# Patient Record
Sex: Female | Born: 1965 | Race: Black or African American | Hispanic: No | Smoking: Current every day smoker
Health system: Southern US, Community
[De-identification: ages and names within clinical notes are randomized; demographics above are authoritative.]

## PROBLEM LIST (undated history)

## (undated) DIAGNOSIS — M109 Gout, unspecified: Secondary | ICD-10-CM

## (undated) DIAGNOSIS — E7800 Pure hypercholesterolemia, unspecified: Secondary | ICD-10-CM

## (undated) HISTORY — PX: BREAST SURGERY: SHX581

## (undated) HISTORY — PX: NEPHRECTOMY: SHX65

---

## 2002-09-07 ENCOUNTER — Encounter: Payer: Self-pay | Admitting: Emergency Medicine

## 2002-09-07 ENCOUNTER — Emergency Department (HOSPITAL_COMMUNITY): Admission: EM | Admit: 2002-09-07 | Discharge: 2002-09-07 | Payer: Self-pay | Admitting: Emergency Medicine

## 2002-11-28 ENCOUNTER — Emergency Department (HOSPITAL_COMMUNITY): Admission: EM | Admit: 2002-11-28 | Discharge: 2002-11-28 | Payer: Self-pay | Admitting: Emergency Medicine

## 2003-07-13 ENCOUNTER — Emergency Department (HOSPITAL_COMMUNITY): Admission: EM | Admit: 2003-07-13 | Discharge: 2003-07-13 | Payer: Self-pay | Admitting: *Deleted

## 2003-07-26 ENCOUNTER — Emergency Department (HOSPITAL_COMMUNITY): Admission: EM | Admit: 2003-07-26 | Discharge: 2003-07-26 | Payer: Self-pay | Admitting: Emergency Medicine

## 2004-05-31 ENCOUNTER — Emergency Department (HOSPITAL_COMMUNITY): Admission: EM | Admit: 2004-05-31 | Discharge: 2004-05-31 | Payer: Self-pay | Admitting: Emergency Medicine

## 2006-08-03 ENCOUNTER — Emergency Department (HOSPITAL_COMMUNITY): Admission: EM | Admit: 2006-08-03 | Discharge: 2006-08-03 | Payer: Self-pay | Admitting: Emergency Medicine

## 2007-03-05 ENCOUNTER — Emergency Department (HOSPITAL_COMMUNITY): Admission: EM | Admit: 2007-03-05 | Discharge: 2007-03-05 | Payer: Self-pay | Admitting: Emergency Medicine

## 2007-10-05 ENCOUNTER — Emergency Department (HOSPITAL_COMMUNITY): Admission: EM | Admit: 2007-10-05 | Discharge: 2007-10-05 | Payer: Self-pay | Admitting: Emergency Medicine

## 2007-10-07 ENCOUNTER — Emergency Department (HOSPITAL_COMMUNITY): Admission: EM | Admit: 2007-10-07 | Discharge: 2007-10-07 | Payer: Self-pay | Admitting: Emergency Medicine

## 2007-11-15 ENCOUNTER — Ambulatory Visit (HOSPITAL_COMMUNITY): Admission: RE | Admit: 2007-11-15 | Discharge: 2007-11-15 | Payer: Self-pay | Admitting: Family Medicine

## 2007-11-29 ENCOUNTER — Ambulatory Visit (HOSPITAL_COMMUNITY): Admission: RE | Admit: 2007-11-29 | Discharge: 2007-11-29 | Payer: Self-pay | Admitting: Family Medicine

## 2007-12-04 ENCOUNTER — Ambulatory Visit (HOSPITAL_COMMUNITY): Admission: RE | Admit: 2007-12-04 | Discharge: 2007-12-04 | Payer: Self-pay | Admitting: General Surgery

## 2007-12-04 ENCOUNTER — Encounter (INDEPENDENT_AMBULATORY_CARE_PROVIDER_SITE_OTHER): Payer: Self-pay | Admitting: General Surgery

## 2008-05-20 ENCOUNTER — Ambulatory Visit (HOSPITAL_COMMUNITY): Admission: RE | Admit: 2008-05-20 | Discharge: 2008-05-20 | Payer: Self-pay | Admitting: Family Medicine

## 2009-09-15 ENCOUNTER — Emergency Department (HOSPITAL_COMMUNITY): Admission: EM | Admit: 2009-09-15 | Discharge: 2009-09-15 | Payer: Self-pay | Admitting: Emergency Medicine

## 2009-11-13 ENCOUNTER — Emergency Department (HOSPITAL_COMMUNITY): Admission: EM | Admit: 2009-11-13 | Discharge: 2009-11-13 | Payer: Self-pay | Admitting: Emergency Medicine

## 2010-05-07 ENCOUNTER — Emergency Department (HOSPITAL_COMMUNITY): Admission: EM | Admit: 2010-05-07 | Discharge: 2010-05-07 | Payer: Self-pay | Admitting: Emergency Medicine

## 2010-06-19 ENCOUNTER — Ambulatory Visit (HOSPITAL_COMMUNITY): Admission: RE | Admit: 2010-06-19 | Discharge: 2010-06-19 | Payer: Self-pay | Admitting: Family Medicine

## 2010-09-16 ENCOUNTER — Ambulatory Visit (HOSPITAL_COMMUNITY): Admission: RE | Admit: 2010-09-16 | Discharge: 2010-09-16 | Payer: Self-pay | Admitting: Family Medicine

## 2010-11-03 IMAGING — CR DG ABDOMEN ACUTE W/ 1V CHEST
4 series · 4 of 4 positions shown · non-contrast
Comparison: Chest radiograph 09/15/2009

CLINICAL DATA: Abdominal pain, constipation, hypertension, smoker

ACUTE ABDOMEN SERIES (ABDOMEN 2 VIEW & CHEST 1 VIEW)

[view not recorded (1 of 4)]
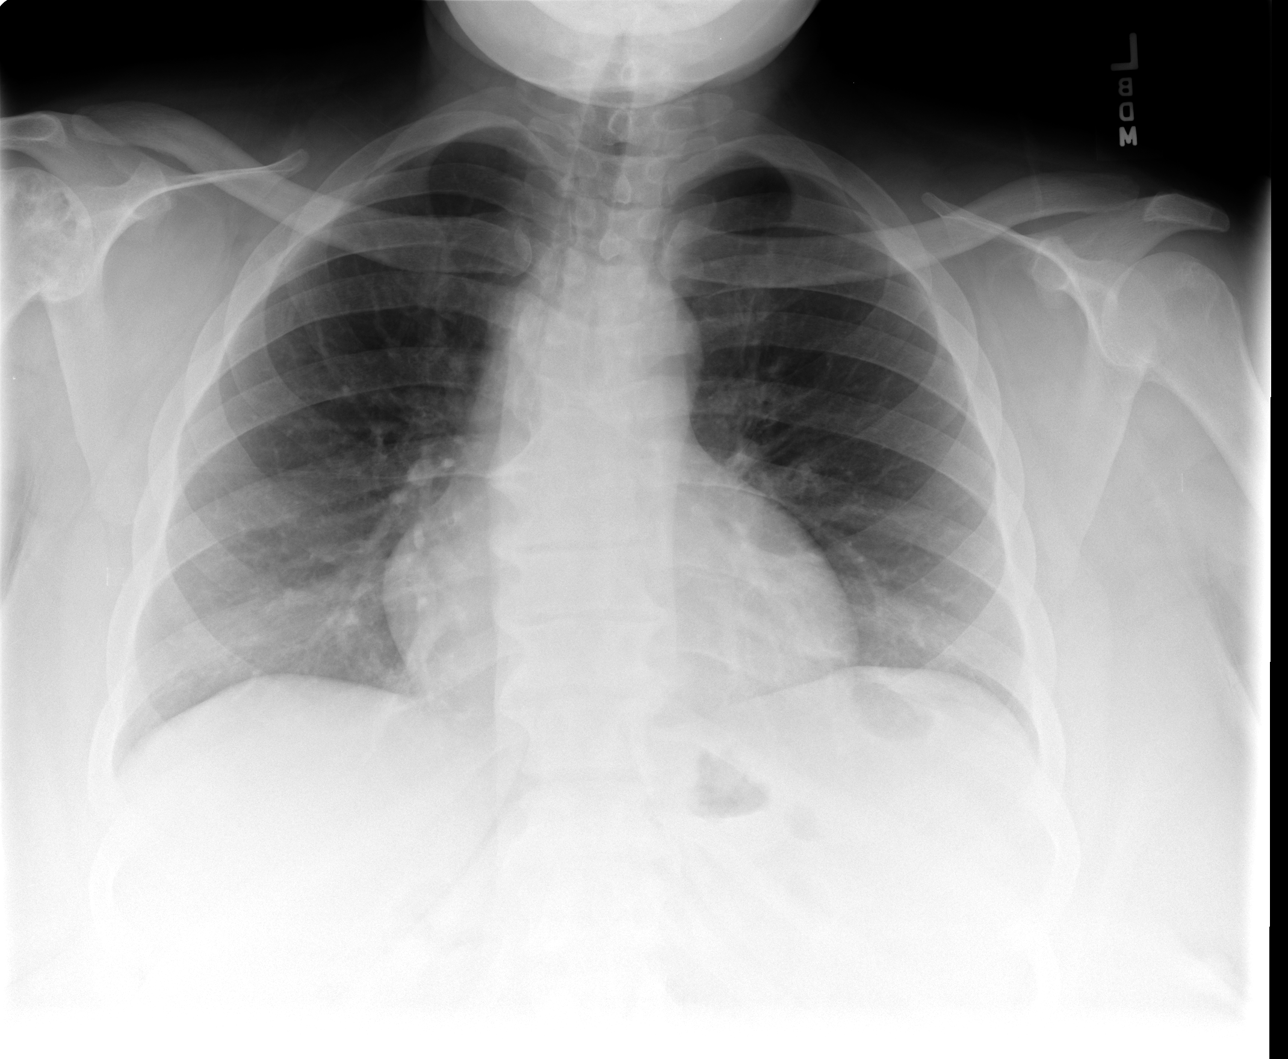

[view not recorded (2 of 4)]
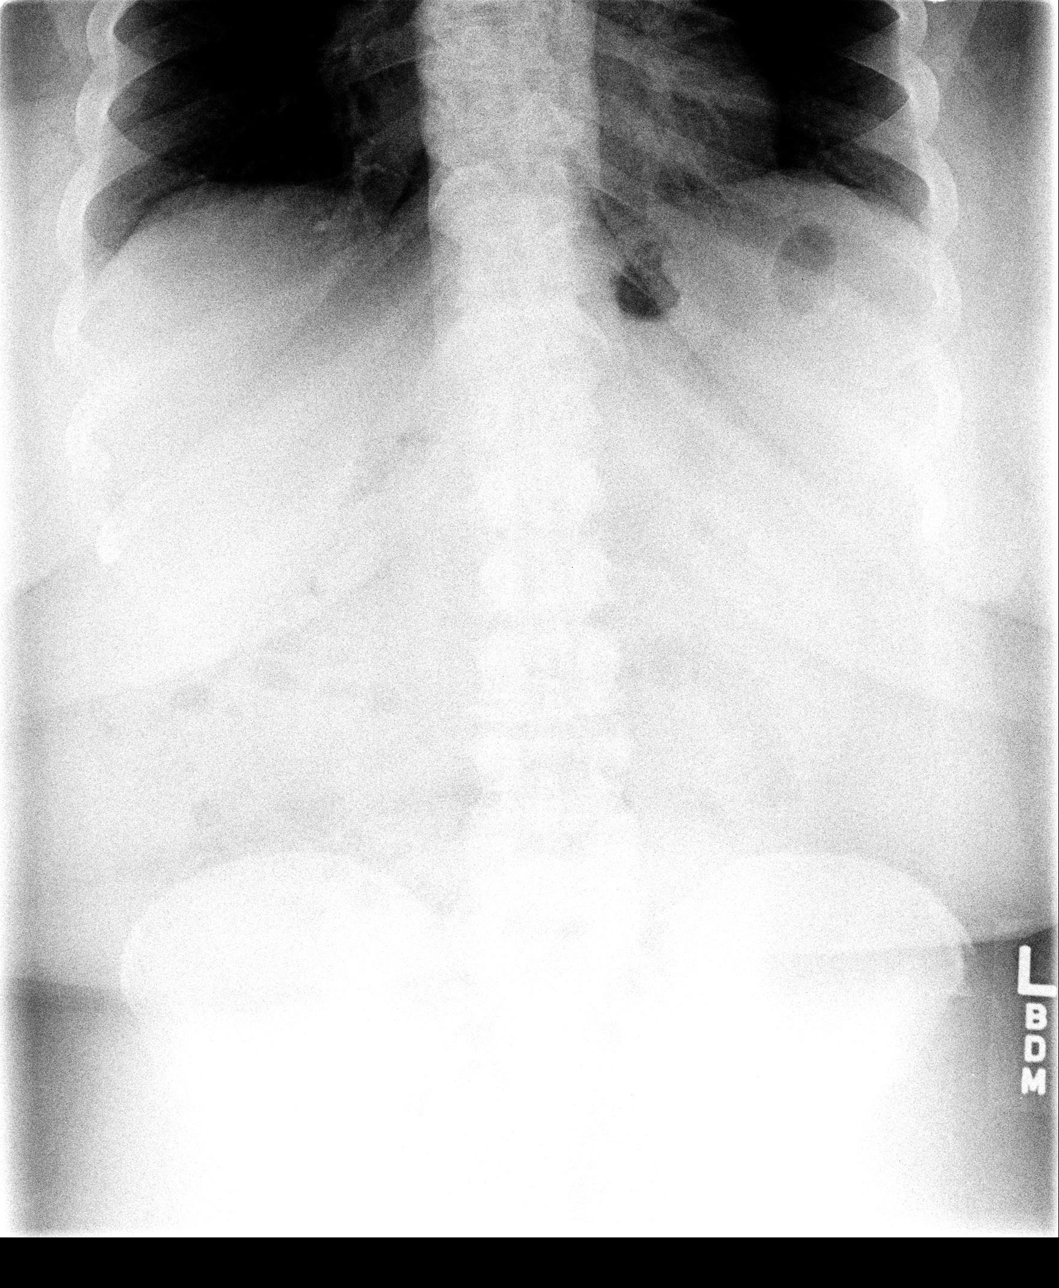

[view not recorded (3 of 4)]
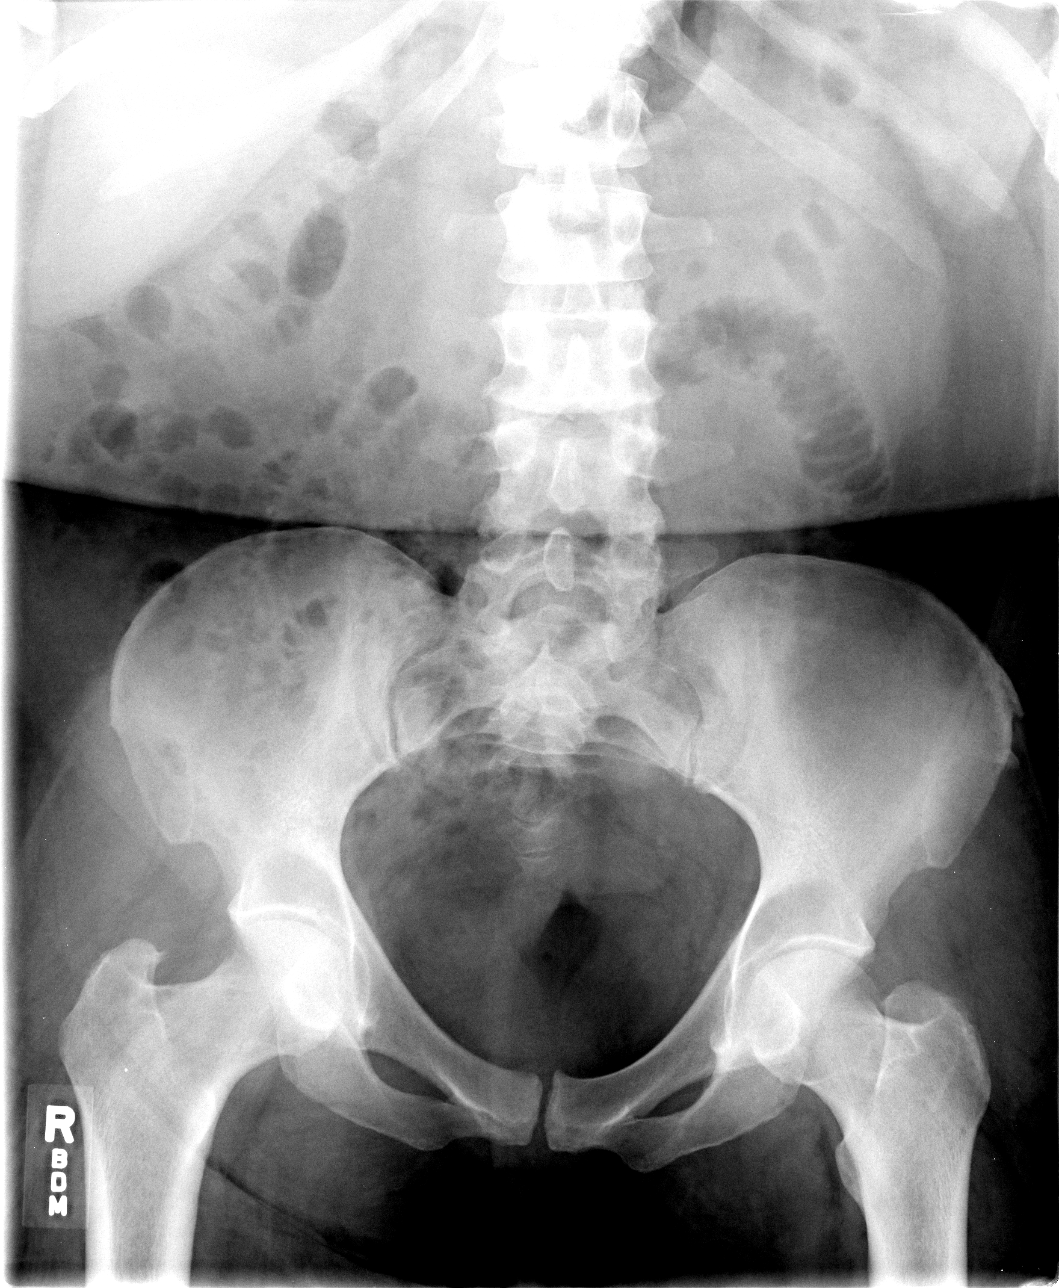

[view not recorded (4 of 4)]
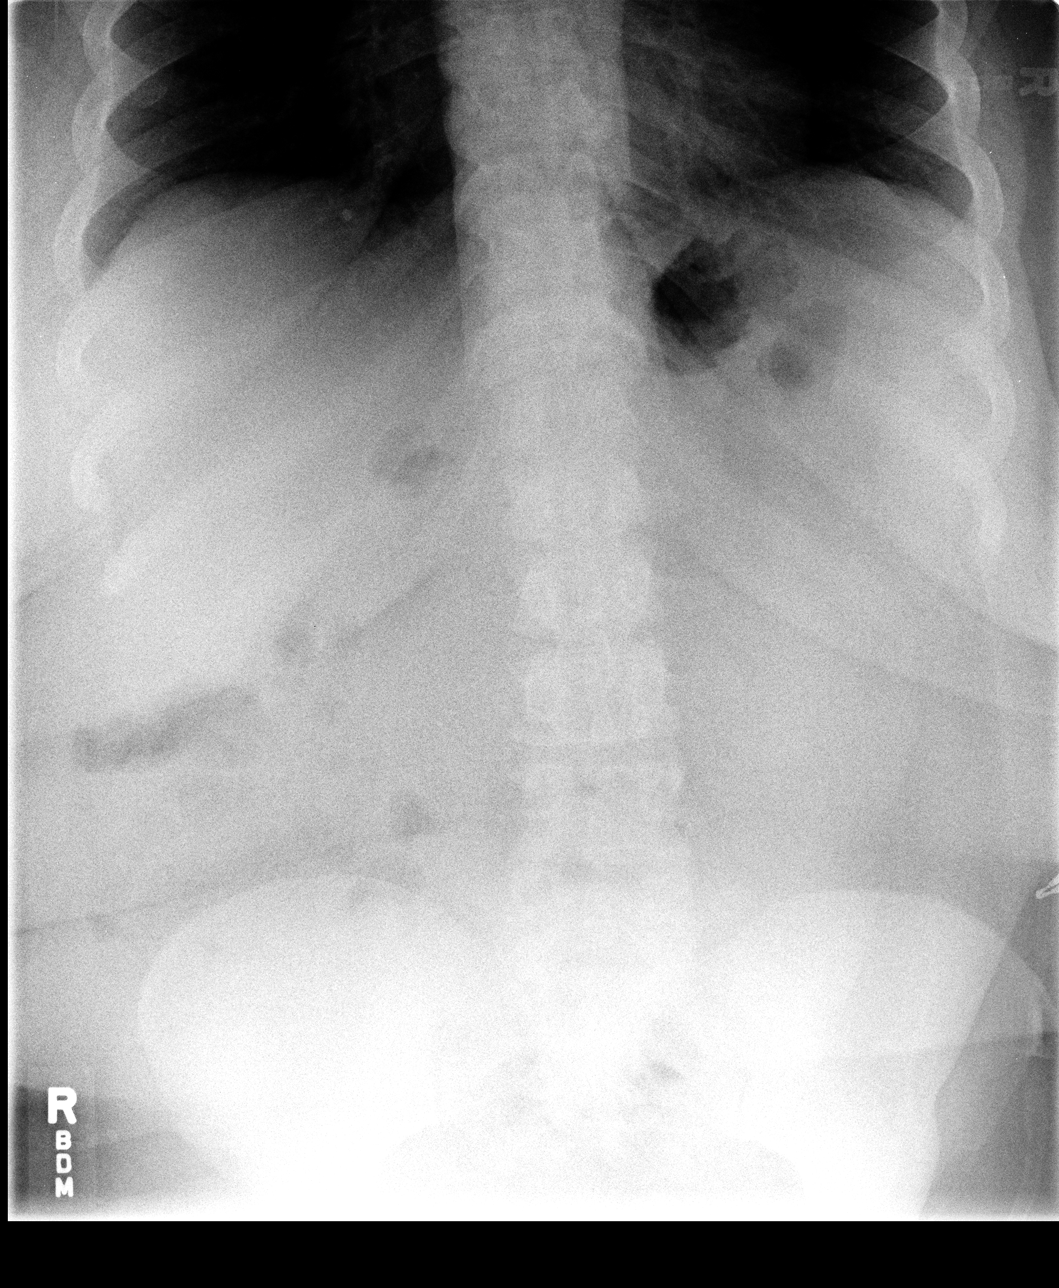

[4 of 4 positions shown; findings below may reference images not displayed]

FINDINGS: Upper normal heart size.
Normal mediastinal contours and pulmonary vascularity.
Lungs clear.
Normal bowel gas pattern.
No bowel dilatation, bowel wall thickening, or free air.
Bones unremarkable.
No definite urinary tract calcification.
IMPRESSION: No acute abnormalities.

## 2011-02-17 LAB — COMPREHENSIVE METABOLIC PANEL
AST: 19 U/L (ref 0–37)
Albumin: 3.5 g/dL (ref 3.5–5.2)
BUN: 9 mg/dL (ref 6–23)
CO2: 21 mEq/L (ref 19–32)
GFR calc Af Amer: 59 mL/min — ABNORMAL LOW (ref >60)
GFR calc non Af Amer: 49 mL/min — ABNORMAL LOW (ref >60)
Sodium: 136 mEq/L (ref 135–145)
Total Protein: 7.2 g/dL (ref 6.0–8.3)

## 2011-02-17 LAB — DIFFERENTIAL
Basophils Relative: 0 % (ref 0–1)
Lymphocytes Relative: 20 % (ref 12–46)
Lymphs Abs: 2.4 10*3/uL (ref 0.7–4.0)
Monocytes Absolute: 0.4 10*3/uL (ref 0.1–1.0)
Monocytes Relative: 3 % (ref 3–12)
Neutro Abs: 8.9 10*3/uL — ABNORMAL HIGH (ref 1.7–7.7)

## 2011-02-17 LAB — POCT CARDIAC MARKERS
Myoglobin, poc: 74.2 ng/mL (ref 12–200)
Troponin i, poc: <0.05 ng/mL (ref 0.00–0.09)

## 2011-02-17 LAB — CBC
Hemoglobin: 11.8 g/dL — ABNORMAL LOW (ref 12.0–15.0)
Platelets: 241 10*3/uL (ref 150–400)

## 2011-03-30 NOTE — Op Note (Signed)
NAME:  Courtney Dyer, Courtney Dyer             ACCOUNT NO.:  [id number]   MEDICAL RECORD NO.:  [id number]          PATIENT TYPE:  AMB   LOCATION:  DAY                           FACILITY:  [facility]   PHYSICIAN:  Jamesetta So, M.D.  DATE OF BIRTH:  Jan 11, 1966   DATE OF PROCEDURE:  12/04/2007  DATE OF DISCHARGE:                               OPERATIVE REPORT   PREOPERATIVE DIAGNOSIS:  Right breast abscess.   POSTOPERATIVE DIAGNOSIS:  Right breast abscess, granulomatous tissue.   PROCEDURE:  Right mastotomy.   SURGEON:  Jamesetta So, M.D.   ANESTHESIA:  General.   INDICATIONS:  The patient is a 45 year old black female who is referred  for evaluation and treatment of a right breast abscess.  The risks and  benefits of the procedure including bleeding, infection, recurrence of  the abscess were fully explained to the patient, gave informed consent.   PROCEDURE NOTE:  The patient was placed in the supine position.  After  general anesthesia was administered, the right breast was prepped and  draped using the usual sterile technique with Betadine.  Surgical site  confirmation was performed.   A curvilinear incision was made in the nipple region along the medial  aspect.  Granulomatous tissue, as well as necrotic tissue was debrided  from underneath the nipple.  The wound was irrigated with normal saline.  The tissue was sent to pathology for further examination.  Sensorcaine  0.5% was instilled into the surrounding incision.  The incision was  closed using a 4-0 Vicryl subcuticular suture.  Dermabond was then  applied.   All tape and needle counts correct at the end of the procedure.  The  patient was extubated in the operating room and went back to recovery  room awake in stable condition.   COMPLICATIONS:  None.   SPECIMEN:  Right breast tissue.   BLOOD LOSS:  Minimal.      Jamesetta So, M.D.  Electronically Signed     MAJ/MEDQ  D:  12/04/2007  T:  12/04/2007  Job:   [id number]   cc:   Lorriane Shire, MD   Jamesetta So, M.D.  Fax: [phone]

## 2011-03-30 NOTE — H&P (Signed)
NAME:  Courtney Dyer, Courtney Dyer             ACCOUNT NO.:  [id number]   MEDICAL RECORD NO.:  [id number]          PATIENT TYPE:  AMB   LOCATION:  DAY                           FACILITY:  [facility]   PHYSICIAN:  Jamesetta So, M.D.  DATE OF BIRTH:  [birth year]   DATE OF ADMISSION:  11/29/2007  DATE OF DISCHARGE:  01/14/2009LH                              HISTORY & PHYSICAL   CHIEF COMPLAINT:  Right breast abscess.   HISTORY OF PRESENT ILLNESS:  The patient is a 45 year old black female  who is referred for evaluation and treatment of a right breast abscess.  It has been present for several months.  She has undergone 2 rounds of  antibiotics without success treatment.  She has had a right breast  incision and drainage in the remote past.  No fevers have been noted.   PAST MEDICAL HISTORY:  Mental disorder.   PAST SURGICAL HISTORY:  1. Kidney surgery.  2. Right breast surgery.   CURRENT MEDICATIONS:  Nabumetone, hydrocodone, doxycycline, fluoxetine,  Lipitor.   ALLERGIES:  No known drug allergies.   REVIEW OF SYSTEMS:  The patient drinks 24 ounces of beer a day.  She  also smokes 1/2 pack of cigarettes a day.  She denies any other  cardiopulmonary difficulties or bleeding disorders.   PHYSICAL EXAMINATION:  GENERAL:  The patient is a well-developed, well-  nourished, black female in no acute distress.  She is obese in nature.  LUNGS:  Clear to auscultation with equal breath sounds bilaterally.  HEART:  Regular rate and rhythm without S3, S4, murmurs.  BREAST:  Left breast examination reveals no dominant mass, nipple  discharge, or dimpling.  The axilla is negative for palpable nodes.  Right breast examination reveals an indurated area noted along the  medial aspect of the nipple.  No dimpling is noted.  The axilla is  negative for palpable nodes.   IMPRESSION:  Right breast abscess.   PLAN:  The patient is scheduled for an incision and drainage of the  right breast abscess on December 04, 2007.  Risks and benefits of the  procedure, including bleeding, infection, recurrence of the abscess,  were fully explained to the patient, gave informed consent.      Jamesetta So, M.D.  Electronically Signed     MAJ/MEDQ  D:  11/30/2007  T:  12/01/2007  Job:  [id number]   cc:   Lorriane Shire, MD

## 2011-04-08 ENCOUNTER — Emergency Department (HOSPITAL_COMMUNITY): Payer: Medicare Other

## 2011-04-08 ENCOUNTER — Emergency Department (HOSPITAL_COMMUNITY)
Admission: EM | Admit: 2011-04-08 | Discharge: 2011-04-08 | Disposition: A | Discharge status: Home / Self Care | Payer: Medicare Other | Attending: Emergency Medicine | Admitting: Emergency Medicine

## 2011-04-08 DIAGNOSIS — N61 Mastitis without abscess: Secondary | ICD-10-CM | POA: Insufficient documentation

## 2011-04-08 DIAGNOSIS — R05 Cough: Secondary | ICD-10-CM | POA: Insufficient documentation

## 2011-04-08 DIAGNOSIS — D72829 Elevated white blood cell count, unspecified: Secondary | ICD-10-CM | POA: Insufficient documentation

## 2011-04-08 DIAGNOSIS — R079 Chest pain, unspecified: Secondary | ICD-10-CM | POA: Insufficient documentation

## 2011-04-08 DIAGNOSIS — R059 Cough, unspecified: Secondary | ICD-10-CM | POA: Insufficient documentation

## 2011-04-08 LAB — BASIC METABOLIC PANEL
BUN: 9 mg/dL (ref 6–23)
CO2: 25 mEq/L (ref 19–32)
Creatinine, Ser: 1.11 mg/dL (ref 0.4–1.2)
GFR calc Af Amer: >60 mL/min (ref >60)
GFR calc non Af Amer: 54 mL/min — ABNORMAL LOW (ref >60)
Glucose, Bld: 94 mg/dL (ref 70–99)
Potassium: 3.9 mEq/L (ref 3.5–5.1)

## 2011-04-08 LAB — DIFFERENTIAL
Basophils Relative: 0 % (ref 0–1)
Eosinophils Absolute: 0.5 10*3/uL (ref 0.0–0.7)
Eosinophils Relative: 4 % (ref 0–5)
Lymphocytes Relative: 16 % (ref 12–46)
Lymphs Abs: 2.1 10*3/uL (ref 0.7–4.0)
Neutro Abs: 10.3 10*3/uL — ABNORMAL HIGH (ref 1.7–7.7)

## 2011-04-08 LAB — CBC
Hemoglobin: 12.1 g/dL (ref 12.0–15.0)
MCH: 27.8 pg (ref 26.0–34.0)
MCHC: 32.7 g/dL (ref 30.0–36.0)
RBC: 4.36 MIL/uL (ref 3.87–5.11)
RDW: 13.5 % (ref 11.5–15.5)

## 2011-05-27 ENCOUNTER — Emergency Department (HOSPITAL_COMMUNITY): Payer: Medicare Other

## 2011-05-27 ENCOUNTER — Emergency Department (HOSPITAL_COMMUNITY)
Admission: EM | Admit: 2011-05-27 | Discharge: 2011-05-27 | Disposition: A | Discharge status: Home / Self Care | Payer: Medicare Other | Attending: Emergency Medicine | Admitting: Emergency Medicine

## 2011-05-27 ENCOUNTER — Encounter: Payer: Self-pay | Admitting: *Deleted

## 2011-05-27 DIAGNOSIS — R059 Cough, unspecified: Secondary | ICD-10-CM | POA: Insufficient documentation

## 2011-05-27 DIAGNOSIS — R0602 Shortness of breath: Secondary | ICD-10-CM | POA: Insufficient documentation

## 2011-05-27 DIAGNOSIS — R05 Cough: Secondary | ICD-10-CM | POA: Insufficient documentation

## 2011-05-27 DIAGNOSIS — E119 Type 2 diabetes mellitus without complications: Secondary | ICD-10-CM | POA: Insufficient documentation

## 2011-05-27 DIAGNOSIS — J40 Bronchitis, not specified as acute or chronic: Secondary | ICD-10-CM | POA: Insufficient documentation

## 2011-05-27 DIAGNOSIS — E7800 Pure hypercholesterolemia, unspecified: Secondary | ICD-10-CM | POA: Insufficient documentation

## 2011-05-27 HISTORY — DX: Pure hypercholesterolemia, unspecified: E78.00

## 2011-05-27 MED ORDER — PREDNISONE 20 MG PO TABS
60.0000 mg | ORAL_TABLET | Freq: Once | ORAL | Status: AC
  Administered 2011-05-27: 60 mg via ORAL
  Filled 2011-05-27: qty 3

## 2011-05-27 MED ORDER — PREDNISONE 10 MG PO TABS: 20.0000 mg | ORAL_TABLET | Freq: Every day | ORAL | Status: AC

## 2011-05-27 MED ORDER — ALBUTEROL SULFATE (5 MG/ML) 0.5% IN NEBU
5.0000 mg | INHALATION_SOLUTION | Freq: Once | RESPIRATORY_TRACT | Status: AC
  Administered 2011-05-27: 5 mg via RESPIRATORY_TRACT

## 2011-05-27 MED ORDER — ALBUTEROL SULFATE HFA 108 (90 BASE) MCG/ACT IN AERS: 2.0000 | INHALATION_SPRAY | RESPIRATORY_TRACT | Status: DC | PRN

## 2011-05-27 MED ORDER — ALBUTEROL SULFATE (2.5 MG/3ML) 0.083% IN NEBU
INHALATION_SOLUTION | RESPIRATORY_TRACT | Status: AC
  Administered 2011-05-27: 5 mg via RESPIRATORY_TRACT
  Filled 2011-05-27: qty 6

## 2011-05-27 NOTE — ED Provider Notes (Addendum)
History     Chief Complaint  Patient presents with  . Cough   HPI Comments: Pt c/o increased sob since yesterday morning, worse last night, with associated cough productive of green sputum. Pt admits to smoking 1 ppw. Breathing more difficult in cold air. Has been using albuterol inhaler with minimal relief. +cp with coughing only. No abd pain, n/v/d, or f/c.  Patient is a 45 y.o. female presenting with cough. The history is provided by the patient.  Cough This is a chronic problem. The current episode started 2 days ago. Episode frequency: persistent. The problem has been gradually worsening (much worse at night). The cough is productive of sputum. There has been no fever. Associated symptoms include chest pain, shortness of breath and wheezing. Pertinent negatives include no chills, no sweats, no ear congestion, no headaches, no rhinorrhea, no sore throat, no myalgias and no eye redness. Treatments tried: home inhaler. The treatment provided mild relief. She is a smoker (1ppw).    Past Medical History  Diagnosis Date  . Diabetes mellitus   . High cholesterol     Past Surgical History  Procedure Date  . Nephrectomy   . Breast surgery     History reviewed. No pertinent family history.  History  Substance Use Topics  . Smoking status: Current Everyday Smoker  . Smokeless tobacco: Not on file  . Alcohol Use: Yes     daily    OB History    Grav Para Term Preterm Abortions TAB SAB Ect Mult Living                  Review of Systems  Constitutional: Negative for chills.  HENT: Negative for sore throat and rhinorrhea.   Eyes: Negative for redness.  Respiratory: Positive for cough, shortness of breath and wheezing.   Cardiovascular: Positive for chest pain.  Gastrointestinal: Negative for abdominal pain.  Musculoskeletal: Negative for myalgias.  Neurological: Negative for headaches.  All other systems reviewed and are negative.    Physical Exam  BP 165/89  Pulse 102   Temp(Src) 98.7 F (37.1 C) (Oral)  Resp 20  Ht 5\' 3"  (1.6 m)  Wt 255 lb 3.2 oz (115.758 kg)  BMI 45.21 kg/m2  SpO2 100%  LMP 05/18/2011  Physical Exam  Nursing note and vitals reviewed. Constitutional: She is oriented to person, place, and time. She appears well-developed and well-nourished. No distress.  HENT:  Head: Normocephalic and atraumatic.  Right Ear: External ear normal.  Left Ear: External ear normal.  Mouth/Throat: Oropharynx is clear and moist and mucous membranes are normal. No posterior oropharyngeal erythema.  Eyes: Conjunctivae are normal. Pupils are equal, round, and reactive to light.       Normal appearance  Neck: Normal range of motion. Neck supple.  Cardiovascular: Normal rate and regular rhythm.  Exam reveals no gallop and no friction rub.   No murmur heard. Pulmonary/Chest: Effort normal. She has wheezes. She has no rhonchi. She has no rales.       Mild to moderate diffuse wheezing throughout  Abdominal: Soft. She exhibits no distension. There is no tenderness.  Musculoskeletal: Normal range of motion.       Normal appearance  Lymphadenopathy:    She has no cervical adenopathy.  Neurological: She is alert and oriented to person, place, and time.       Motor intact in all extremities  Skin: Skin is warm and dry. No rash noted.       Color normal  Psychiatric: She has a normal mood and affect.    ED Course  Procedures  Written by Caryl Bis acting as scribe for Dr. Zenia Resides.  MDM  Pt given albuterol and prednisone, feels better   I personally performed the services described in this documentation, which was scribed in my presence. The recorded information has been reviewed and considered. No att. providers found   Leota Jacobsen, MD 05/27/11 1640  Leota Jacobsen, MD 06/26/11 1652  Leota Jacobsen, MD 06/26/11 [phone]

## 2011-05-27 NOTE — ED Notes (Signed)
Difficulty breathing at night and early in morning x 1 wk with productive cough, green.  Denies fever.

## 2011-08-05 LAB — BASIC METABOLIC PANEL
BUN: 9
CO2: 26
Creatinine, Ser: 1.22 — ABNORMAL HIGH
GFR calc Af Amer: 59 — ABNORMAL LOW
GFR calc non Af Amer: 49 — ABNORMAL LOW
Glucose, Bld: 99
Sodium: 139

## 2011-08-05 LAB — CBC
Hemoglobin: 13.5
MCHC: 32.6

## 2011-08-05 LAB — HCG, QUANTITATIVE, PREGNANCY: hCG, Beta Chain, Quant, S: <2

## 2012-02-07 ENCOUNTER — Other Ambulatory Visit (HOSPITAL_COMMUNITY): Payer: Self-pay | Admitting: Family Medicine

## 2012-02-07 ENCOUNTER — Ambulatory Visit (HOSPITAL_COMMUNITY)
Admission: RE | Admit: 2012-02-07 | Discharge: 2012-02-07 | Disposition: A | Discharge status: Home / Self Care | Payer: Medicare Other | Source: Ambulatory Visit | Attending: Family Medicine | Admitting: Family Medicine

## 2012-02-07 ENCOUNTER — Encounter (HOSPITAL_COMMUNITY): Payer: Self-pay

## 2012-02-07 ENCOUNTER — Emergency Department (HOSPITAL_COMMUNITY)
Admission: EM | Admit: 2012-02-07 | Discharge: 2012-02-07 | Disposition: A | Discharge status: Home / Self Care | Payer: Medicare Other | Attending: Physician Assistant | Admitting: Physician Assistant

## 2012-02-07 DIAGNOSIS — E7800 Pure hypercholesterolemia, unspecified: Secondary | ICD-10-CM | POA: Insufficient documentation

## 2012-02-07 DIAGNOSIS — W19XXXA Unspecified fall, initial encounter: Secondary | ICD-10-CM | POA: Insufficient documentation

## 2012-02-07 DIAGNOSIS — Z79899 Other long term (current) drug therapy: Secondary | ICD-10-CM | POA: Insufficient documentation

## 2012-02-07 DIAGNOSIS — T148XXA Other injury of unspecified body region, initial encounter: Secondary | ICD-10-CM

## 2012-02-07 DIAGNOSIS — F17200 Nicotine dependence, unspecified, uncomplicated: Secondary | ICD-10-CM | POA: Insufficient documentation

## 2012-02-07 DIAGNOSIS — S6390XA Sprain of unspecified part of unspecified wrist and hand, initial encounter: Secondary | ICD-10-CM | POA: Insufficient documentation

## 2012-02-07 DIAGNOSIS — E119 Type 2 diabetes mellitus without complications: Secondary | ICD-10-CM | POA: Insufficient documentation

## 2012-02-07 MED ORDER — HYDROCODONE-ACETAMINOPHEN 5-325 MG PO TABS: 1.0000 | ORAL_TABLET | ORAL | Status: AC | PRN

## 2012-02-07 MED ORDER — ONDANSETRON HCL 4 MG PO TABS
4.0000 mg | ORAL_TABLET | Freq: Once | ORAL | Status: AC
  Administered 2012-02-07: 4 mg via ORAL
  Filled 2012-02-07: qty 1

## 2012-02-07 MED ORDER — HYDROCODONE-ACETAMINOPHEN 5-325 MG PO TABS: 1.0000 | ORAL_TABLET | ORAL | Status: DC | PRN

## 2012-02-07 MED ORDER — HYDROCODONE-ACETAMINOPHEN 5-325 MG PO TABS
2.0000 | ORAL_TABLET | Freq: Once | ORAL | Status: AC
  Administered 2012-02-07: 2 via ORAL
  Filled 2012-02-07: qty 1

## 2012-02-07 MED ORDER — IBUPROFEN 800 MG PO TABS
800.0000 mg | ORAL_TABLET | Freq: Once | ORAL | Status: AC
  Administered 2012-02-07: 800 mg via ORAL
  Filled 2012-02-07: qty 1

## 2012-02-07 NOTE — Discharge Instructions (Signed)
Please see Dr Cindie Laroche for evaluation of your xrays and to control your discomfort. Please leave the splint in place until xrays return.

## 2012-02-07 NOTE — ED Notes (Signed)
Pt states she fell Saturday night. Complain of pain in right hand

## 2012-03-11 ENCOUNTER — Encounter (HOSPITAL_COMMUNITY): Payer: Self-pay | Admitting: *Deleted

## 2012-03-11 ENCOUNTER — Emergency Department (HOSPITAL_COMMUNITY): Payer: Medicare Other

## 2012-03-11 ENCOUNTER — Emergency Department (HOSPITAL_COMMUNITY)
Admission: EM | Admit: 2012-03-11 | Discharge: 2012-03-11 | Disposition: A | Discharge status: Home / Self Care | Payer: Medicare Other | Attending: Emergency Medicine | Admitting: Emergency Medicine

## 2012-03-11 DIAGNOSIS — M25559 Pain in unspecified hip: Secondary | ICD-10-CM

## 2012-03-11 DIAGNOSIS — R071 Chest pain on breathing: Secondary | ICD-10-CM | POA: Insufficient documentation

## 2012-03-11 DIAGNOSIS — R079 Chest pain, unspecified: Secondary | ICD-10-CM | POA: Insufficient documentation

## 2012-03-11 DIAGNOSIS — E119 Type 2 diabetes mellitus without complications: Secondary | ICD-10-CM | POA: Insufficient documentation

## 2012-03-11 DIAGNOSIS — R0789 Other chest pain: Secondary | ICD-10-CM

## 2012-03-11 DIAGNOSIS — R059 Cough, unspecified: Secondary | ICD-10-CM | POA: Insufficient documentation

## 2012-03-11 DIAGNOSIS — E7800 Pure hypercholesterolemia, unspecified: Secondary | ICD-10-CM | POA: Insufficient documentation

## 2012-03-11 DIAGNOSIS — R05 Cough: Secondary | ICD-10-CM | POA: Insufficient documentation

## 2012-03-11 MED ORDER — NAPROXEN 500 MG PO TABS: 500.0000 mg | ORAL_TABLET | Freq: Two times a day (BID) | ORAL | Status: AC

## 2012-03-11 MED ORDER — HYDROCODONE-ACETAMINOPHEN 5-325 MG PO TABS: 1.0000 | ORAL_TABLET | Freq: Four times a day (QID) | ORAL | Status: AC | PRN

## 2012-03-11 NOTE — Discharge Instructions (Signed)
Take medicines as directed. Followup with your primary care doctor in the next few days for recheck. Turn for new or worse symptoms. In

## 2012-03-11 NOTE — ED Notes (Signed)
Left sided rib pain into breast with coughing and deep breath x 2 days.  Denies sob/fever.

## 2012-03-11 NOTE — ED Notes (Signed)
Pt states "bad cough" x ~ 2weeks, nonproductive. States pain was also to left hip but has moved to left breast past 2 days.

## 2012-03-11 NOTE — ED Provider Notes (Signed)
History   This chart was scribed for Courtney Kung, MD by Courtney Dyer. The patient was seen in room APA04/APA04. Patient's care was started at 0931.    CSN: [id number]  Arrival date & time 03/11/12  [id number]   First MD Initiated Contact with Patient 03/11/12 [phone]      Chief Complaint  Patient presents with  . Cough  . Chest Pain    (Consider location/radiation/quality/duration/timing/severity/associated sxs/prior treatment) HPI Courtney Dyer is a 46 y.o. female who presents to the Emergency Department complaining of moderate chest soreness localized to left side of chest with coughing onset 3 days ago and persistent since. Patient states chest soreness is aggravated with movement of LUE and coughing. Denies associated SOB, diaphoresis, HA, neck pain, nausea, vomiting.  Additionally, patient c/o waxing and waning severe left side hip pain onset 2 weeks ago. Patient rates pain 9/10 and states that no injury was sustained. Patient denies the use of motrin and OTC medication, fever, HA, congestion, sore throat, nausea, emesis, dysuria, diarrhea, and SOB. Patient has a history of smoking, alcohol use, and diabetes mellitus. Pt List PCP as Dr. Lorriane Dyer.   Past Medical History  Diagnosis Date  . Diabetes mellitus   . High cholesterol     Past Surgical History  Procedure Date  . Nephrectomy   . Breast surgery     No family history on file.  History  Substance Use Topics  . Smoking status: Current Everyday Smoker  . Smokeless tobacco: Not on file  . Alcohol Use: Yes     daily    OB History    Grav Para Term Preterm Abortions TAB SAB Ect Mult Living                  Review of Systems  Constitutional: Negative for fever.  HENT: Negative for rhinorrhea and neck pain.   Eyes: Negative for pain.  Respiratory: Positive for cough. Negative for shortness of breath.   Cardiovascular: Positive for chest pain.  Gastrointestinal: Negative for nausea, vomiting, abdominal pain  and diarrhea.  Genitourinary: Negative for dysuria.  Musculoskeletal: Negative for back pain.       Left hip pain  Skin: Negative for rash.  Neurological: Negative for weakness and headaches.    Allergies  Review of patient's allergies indicates no known allergies.  Home Medications   Current Outpatient Rx  Name Route Sig Dispense Refill  . ALBUTEROL SULFATE HFA 108 (90 BASE) MCG/ACT IN AERS Inhalation Inhale 2 puffs into the lungs every 4 (four) hours as needed for wheezing. 1 Inhaler 0  . ATORVASTATIN CALCIUM 20 MG PO TABS Oral Take 20 mg by mouth daily.      . COLESEVELAM HCL 625 MG PO TABS Oral Take 1,875 mg by mouth 2 (two) times daily with a meal.      . HYDROCODONE-ACETAMINOPHEN 5-325 MG PO TABS Oral Take 1-2 tablets by mouth every 6 (six) hours as needed for pain. 14 tablet 0  . NAPROXEN 500 MG PO TABS Oral Take 1 tablet (500 mg total) by mouth 2 (two) times daily. 14 tablet 0    BP 110/55  Pulse 80  Temp(Src) 98.4 F (36.9 C) (Oral)  Resp 18  Ht 5' (1.524 m)  Wt 250 lb (113.399 kg)  BMI 48.82 kg/m2  SpO2 98%  LMP 03/04/2012  Physical Exam  Nursing note and vitals reviewed. Constitutional: She is oriented to person, place, and time. She appears well-developed and well-nourished. No distress.  HENT:  Head: Normocephalic and atraumatic.  Eyes: EOM are normal. Pupils are equal, round, and reactive to light.  Neck: Neck supple. No tracheal deviation present.  Cardiovascular: Normal rate, regular rhythm, normal heart sounds and intact distal pulses.  Exam reveals no gallop and no friction rub.   No murmur heard. Pulmonary/Chest: Effort normal. No respiratory distress. She has no wheezes. She has no rales. She exhibits tenderness (left pectoralis muscle).  Abdominal: Soft. Bowel sounds are normal. She exhibits no distension. There is no tenderness.  Musculoskeletal: Normal range of motion. She exhibits tenderness (left pectoralis muscle). She exhibits no edema.        Mild tenderness to palpation of left hip.   Neurological: She is alert and oriented to person, place, and time. No cranial nerve deficit or sensory deficit. Coordination normal.  Skin: Skin is warm and dry.  Psychiatric: She has a normal mood and affect. Her behavior is normal.    ED Course  Procedures (including critical care time)  DIAGNOSTIC STUDIES: Oxygen Saturation is 97% on room air, adequate by my interpretation.    COORDINATION OF CARE:   Labs Reviewed - No data to display Dg Chest 2 View  03/11/2012  *RADIOLOGY REPORT*  Clinical Data: Cough and left-sided chest pain.  CHEST - 2 VIEW  Comparison: 05/27/2011  Findings: Low bilateral lung volumes with bibasilar atelectasis. No acute infiltrate.  No evidence of edema, effusion, nodule or pneumothorax.  The heart size is stable and at the upper limits of normal.  The bony thorax shows stable degenerative changes of the thoracic spine.  There are also stable degenerative changes of the right shoulder.  IMPRESSION: Low lung volumes with bibasilar atelectasis.  No acute findings.  Original Report Authenticated By: Azzie Roup, M.D.   Dg Hip Complete Left  03/11/2012  *RADIOLOGY REPORT*  Clinical Data: Left hip pain.  LEFT HIP - COMPLETE 2+ VIEW  Comparison: None.  Findings: No evidence of acute fracture or dislocation.  No bony lesions or destruction.  The frontal pelvic films show symmetric mild joint space narrowing of both hips.  IMPRESSION: No acute findings.  Mild joint space narrowing consistent with osteoarthritis.  Original Report Authenticated By: Azzie Roup, M.D.    Date: 03/11/2012  Rate: 88  Rhythm: normal sinus rhythm  QRS Axis: normal  Intervals: normal  ST/T Wave abnormalities: normal  Conduction Disutrbances:none  Narrative Interpretation:   Old EKG Reviewed: unchanged    1. Chest wall pain   2. Hip pain       MDM   Patient's left chest pain clinically is consistent with chest wall pain worse  with cough worse with movement of the left arm supple on the edge of the pectoralis muscle. No obvious breast mass palpable the left breast. Left hip pain without evidence of fracture could be some mild arthritic changes in that area. Chest x-ray negative for pneumonia or pneumothorax. EKG without any acute changes.  I personally performed the services described in this documentation, which was scribed in my presence. The recorded information has been reviewed and considered.     Courtney Kung, MD 03/11/12 [phone]

## 2012-03-12 NOTE — ED Provider Notes (Signed)
History     CSN: [id number]  Arrival date & time 02/07/12  0906   None     Chief Complaint  Patient presents with  . Hand Pain    (Consider location/radiation/quality/duration/timing/severity/associated sxs/prior treatment) Patient is a 46 y.o. female presenting with hand pain. The history is provided by the patient.  Hand Pain This is a new problem. The current episode started in the past 7 days. The problem occurs constantly. The problem has been unchanged. Associated symptoms include arthralgias and coughing. Pertinent negatives include no abdominal pain, chest pain or neck pain. Associated symptoms comments: Hand pain. Exacerbated by: movement and palpation. She has tried NSAIDs for the symptoms. The treatment provided no relief.    Past Medical History  Diagnosis Date  . Diabetes mellitus   . High cholesterol     Past Surgical History  Procedure Date  . Nephrectomy   . Breast surgery     No family history on file.  History  Substance Use Topics  . Smoking status: Current Everyday Smoker  . Smokeless tobacco: Not on file  . Alcohol Use: Yes     daily    OB History    Grav Para Term Preterm Abortions TAB SAB Ect Mult Living                  Review of Systems  Constitutional: Negative for activity change.       All ROS Neg except as noted in HPI  HENT: Negative for nosebleeds and neck pain.   Eyes: Negative for photophobia and discharge.  Respiratory: Positive for cough. Negative for shortness of breath and wheezing.   Cardiovascular: Negative for chest pain and palpitations.  Gastrointestinal: Negative for abdominal pain and blood in stool.  Genitourinary: Negative for dysuria, frequency and hematuria.  Musculoskeletal: Positive for arthralgias. Negative for back pain.  Skin: Negative.   Neurological: Negative for dizziness, seizures and speech difficulty.  Psychiatric/Behavioral: Negative for hallucinations and confusion.    Allergies  Review of  patient's allergies indicates no known allergies.  Home Medications   Current Outpatient Rx  Name Route Sig Dispense Refill  . ALBUTEROL SULFATE (2.5 MG/3ML) 0.083% IN NEBU Nebulization Take 2.5 mg by nebulization every 6 (six) hours as needed. For wheezing    . ATORVASTATIN CALCIUM 20 MG PO TABS Oral Take 20 mg by mouth daily.      . COLESEVELAM HCL 625 MG PO TABS Oral Take 1,875 mg by mouth daily. Only takes once daily    . HYDROCODONE-ACETAMINOPHEN 5-325 MG PO TABS Oral Take 1-2 tablets by mouth every 6 (six) hours as needed for pain. 14 tablet 0  . NAPROXEN 500 MG PO TABS Oral Take 1 tablet (500 mg total) by mouth 2 (two) times daily. 14 tablet 0    BP 141/91  Pulse 97  Temp(Src) 98.3 F (36.8 C) (Oral)  Resp 18  Wt 256 lb (116.121 kg)  SpO2 100%  LMP 01/17/2012  Physical Exam  Nursing note and vitals reviewed. Constitutional: She is oriented to person, place, and time. She appears well-developed and well-nourished.  Non-toxic appearance.  HENT:  Head: Normocephalic.  Right Ear: Tympanic membrane and external ear normal.  Left Ear: Tympanic membrane and external ear normal.  Eyes: EOM and lids are normal. Pupils are equal, round, and reactive to light.  Neck: Normal range of motion. Neck supple. Carotid bruit is not present.  Cardiovascular: Normal rate, regular rhythm, normal heart sounds, intact distal pulses and normal pulses.  Pulmonary/Chest: Breath sounds normal. No respiratory distress.  Abdominal: Soft. Bowel sounds are normal. There is no tenderness. There is no guarding.  Musculoskeletal: Normal range of motion.       Soreness with ROM of the fingers. Palmar arch refill briskly. No pain in the snuff box. FROM of the elbow.  Lymphadenopathy:       Head (right side): No submandibular adenopathy present.       Head (left side): No submandibular adenopathy present.    She has no cervical adenopathy.  Neurological: She is alert and oriented to person, place, and  time. She has normal strength. No cranial nerve deficit or sensory deficit.  Skin: Skin is warm and dry.  Psychiatric: She has a normal mood and affect. Her speech is normal.    ED Course  Procedures (including critical care time)  Labs Reviewed - No data to display Dg Chest 2 View  03/11/2012  *RADIOLOGY REPORT*  Clinical Data: Cough and left-sided chest pain.  CHEST - 2 VIEW  Comparison: 05/27/2011  Findings: Low bilateral lung volumes with bibasilar atelectasis. No acute infiltrate.  No evidence of edema, effusion, nodule or pneumothorax.  The heart size is stable and at the upper limits of normal.  The bony thorax shows stable degenerative changes of the thoracic spine.  There are also stable degenerative changes of the right shoulder.  IMPRESSION: Low lung volumes with bibasilar atelectasis.  No acute findings.  Original Report Authenticated By: Azzie Roup, M.D.   Dg Hip Complete Left  03/11/2012  *RADIOLOGY REPORT*  Clinical Data: Left hip pain.  LEFT HIP - COMPLETE 2+ VIEW  Comparison: None.  Findings: No evidence of acute fracture or dislocation.  No bony lesions or destruction.  The frontal pelvic films show symmetric mild joint space narrowing of both hips.  IMPRESSION: No acute findings.  Mild joint space narrowing consistent with osteoarthritis.  Original Report Authenticated By: Azzie Roup, M.D.     1. Hand sprain       MDM  I have reviewed nursing notes, vital signs, and all appropriate lab and imaging results for this patient. Pt has good cap refill and sensory. She can move all fingers and wrist. No pain in snuff box. FROM of Rt shoulder and elbow. Pt fitted with Watson-Jones splint. Rx for norco given. Pt to follow up with Dr Aline Brochure (ortho) if not improving.       Lenox Ahr, [place] 03/12/12 [phone]

## 2012-03-14 NOTE — ED Provider Notes (Signed)
Medical screening examination/treatment/procedure(s) were performed by non-physician practitioner and as supervising physician I was immediately available for consultation/collaboration.   Alfonzo Feller, DO 03/14/12 2304

## 2012-07-10 ENCOUNTER — Other Ambulatory Visit (HOSPITAL_COMMUNITY): Payer: Self-pay | Admitting: Family Medicine

## 2012-07-10 DIAGNOSIS — Z139 Encounter for screening, unspecified: Secondary | ICD-10-CM

## 2012-07-13 ENCOUNTER — Ambulatory Visit (HOSPITAL_COMMUNITY)
Admission: RE | Admit: 2012-07-13 | Discharge: 2012-07-13 | Disposition: A | Discharge status: Home / Self Care | Payer: Medicare Other | Source: Ambulatory Visit | Attending: Family Medicine | Admitting: Family Medicine

## 2012-07-13 DIAGNOSIS — Z1231 Encounter for screening mammogram for malignant neoplasm of breast: Secondary | ICD-10-CM | POA: Insufficient documentation

## 2012-07-13 DIAGNOSIS — Z139 Encounter for screening, unspecified: Secondary | ICD-10-CM

## 2012-07-19 ENCOUNTER — Other Ambulatory Visit: Payer: Self-pay | Admitting: Family Medicine

## 2012-07-19 DIAGNOSIS — R928 Other abnormal and inconclusive findings on diagnostic imaging of breast: Secondary | ICD-10-CM

## 2012-08-02 ENCOUNTER — Other Ambulatory Visit: Payer: Self-pay | Admitting: Family Medicine

## 2012-08-02 ENCOUNTER — Ambulatory Visit (HOSPITAL_COMMUNITY)
Admission: RE | Admit: 2012-08-02 | Discharge: 2012-08-02 | Disposition: A | Discharge status: Home / Self Care | Payer: Medicare Other | Source: Ambulatory Visit | Attending: Family Medicine | Admitting: Family Medicine

## 2012-08-02 DIAGNOSIS — R928 Other abnormal and inconclusive findings on diagnostic imaging of breast: Secondary | ICD-10-CM

## 2012-08-02 DIAGNOSIS — N63 Unspecified lump in unspecified breast: Secondary | ICD-10-CM | POA: Insufficient documentation

## 2012-08-09 ENCOUNTER — Ambulatory Visit (HOSPITAL_COMMUNITY)
Admission: RE | Admit: 2012-08-09 | Discharge: 2012-08-09 | Disposition: A | Discharge status: Home / Self Care | Payer: Medicare Other | Source: Ambulatory Visit | Attending: Family Medicine | Admitting: Family Medicine

## 2012-08-09 ENCOUNTER — Other Ambulatory Visit: Payer: Self-pay | Admitting: Family Medicine

## 2012-08-09 DIAGNOSIS — R928 Other abnormal and inconclusive findings on diagnostic imaging of breast: Secondary | ICD-10-CM

## 2012-08-09 DIAGNOSIS — N63 Unspecified lump in unspecified breast: Secondary | ICD-10-CM | POA: Insufficient documentation

## 2012-08-09 NOTE — Progress Notes (Signed)
Tolerated procedure well .Lidocaine 18ml.  to operative site. Sterile dressing applied. Discharge instruction given

## 2012-08-09 NOTE — Progress Notes (Signed)
Time out performed Dr. Reynold Bowen. Yolanda Bonine RN

## 2012-09-07 IMAGING — CR DG HIP (WITH OR WITHOUT PELVIS) 2-3V*L*
3 series · 3 of 3 positions shown · non-contrast
Comparison: None.

CLINICAL DATA: Left hip pain.

LEFT HIP - COMPLETE 2+ VIEW

[view not recorded (1 of 3)]
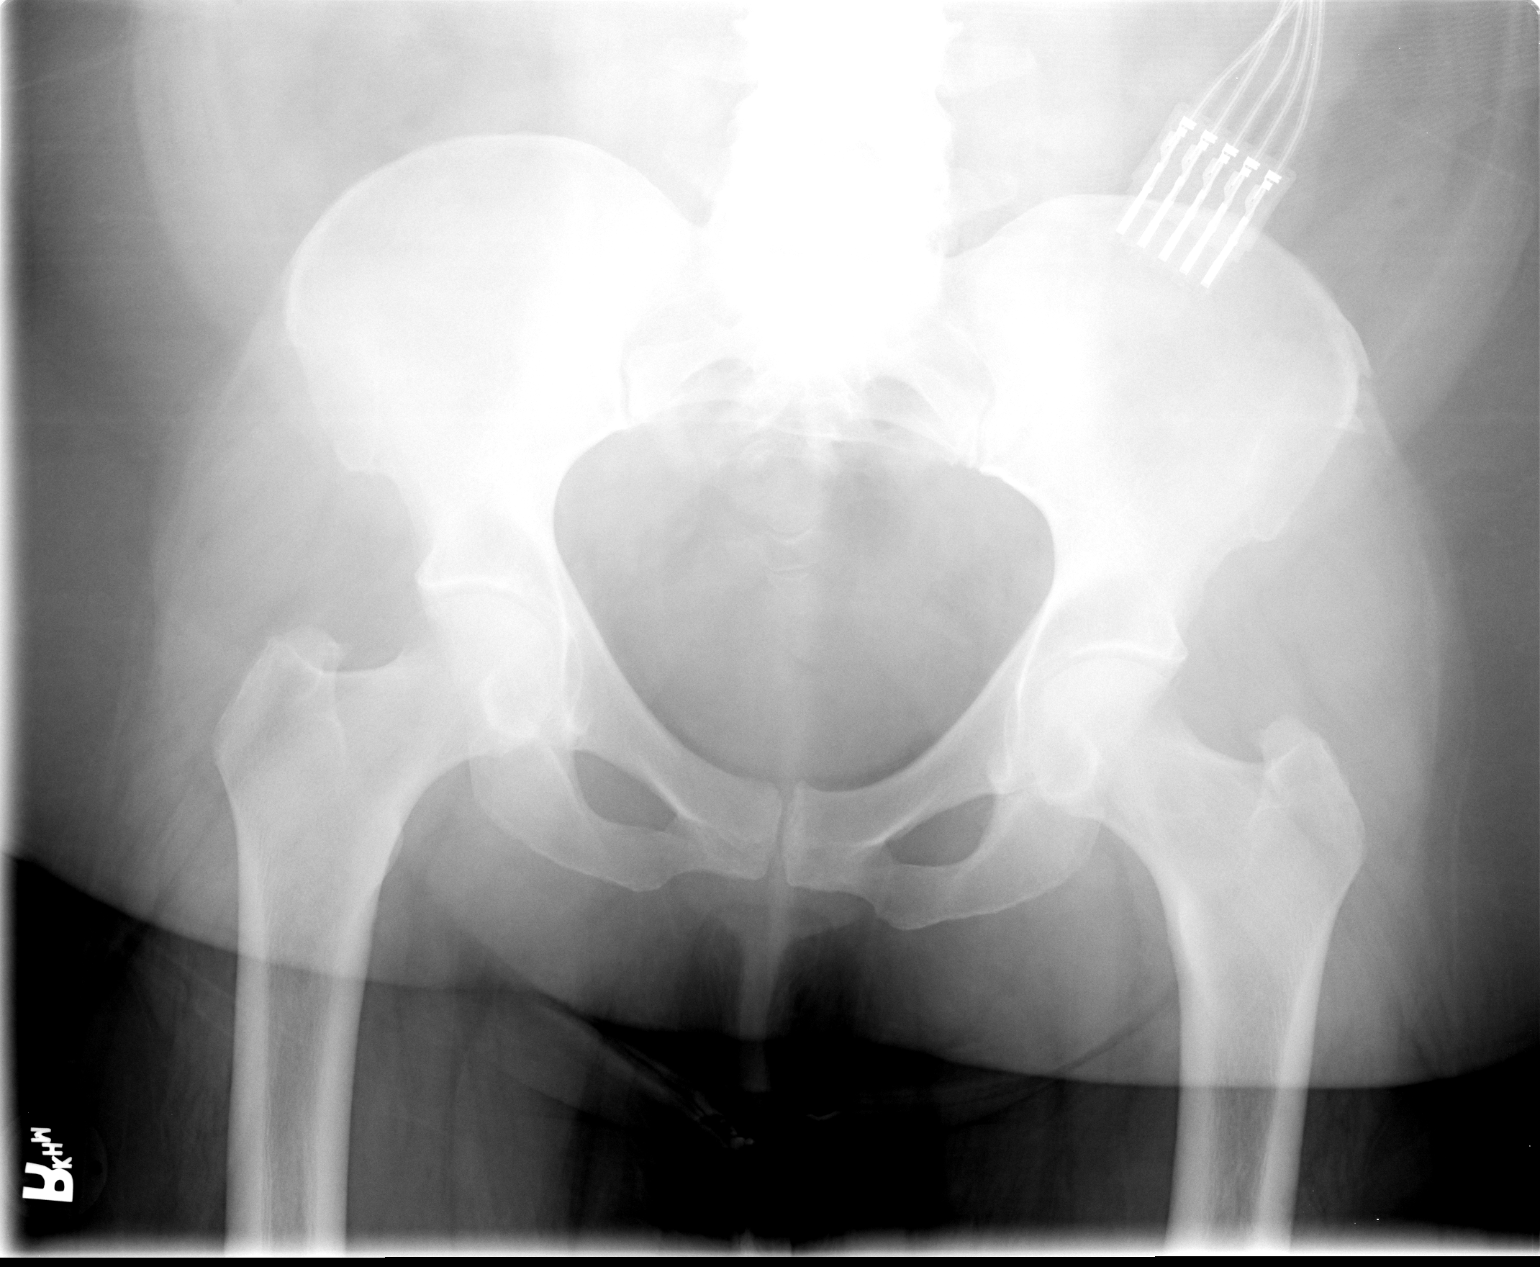

[view not recorded (2 of 3)]
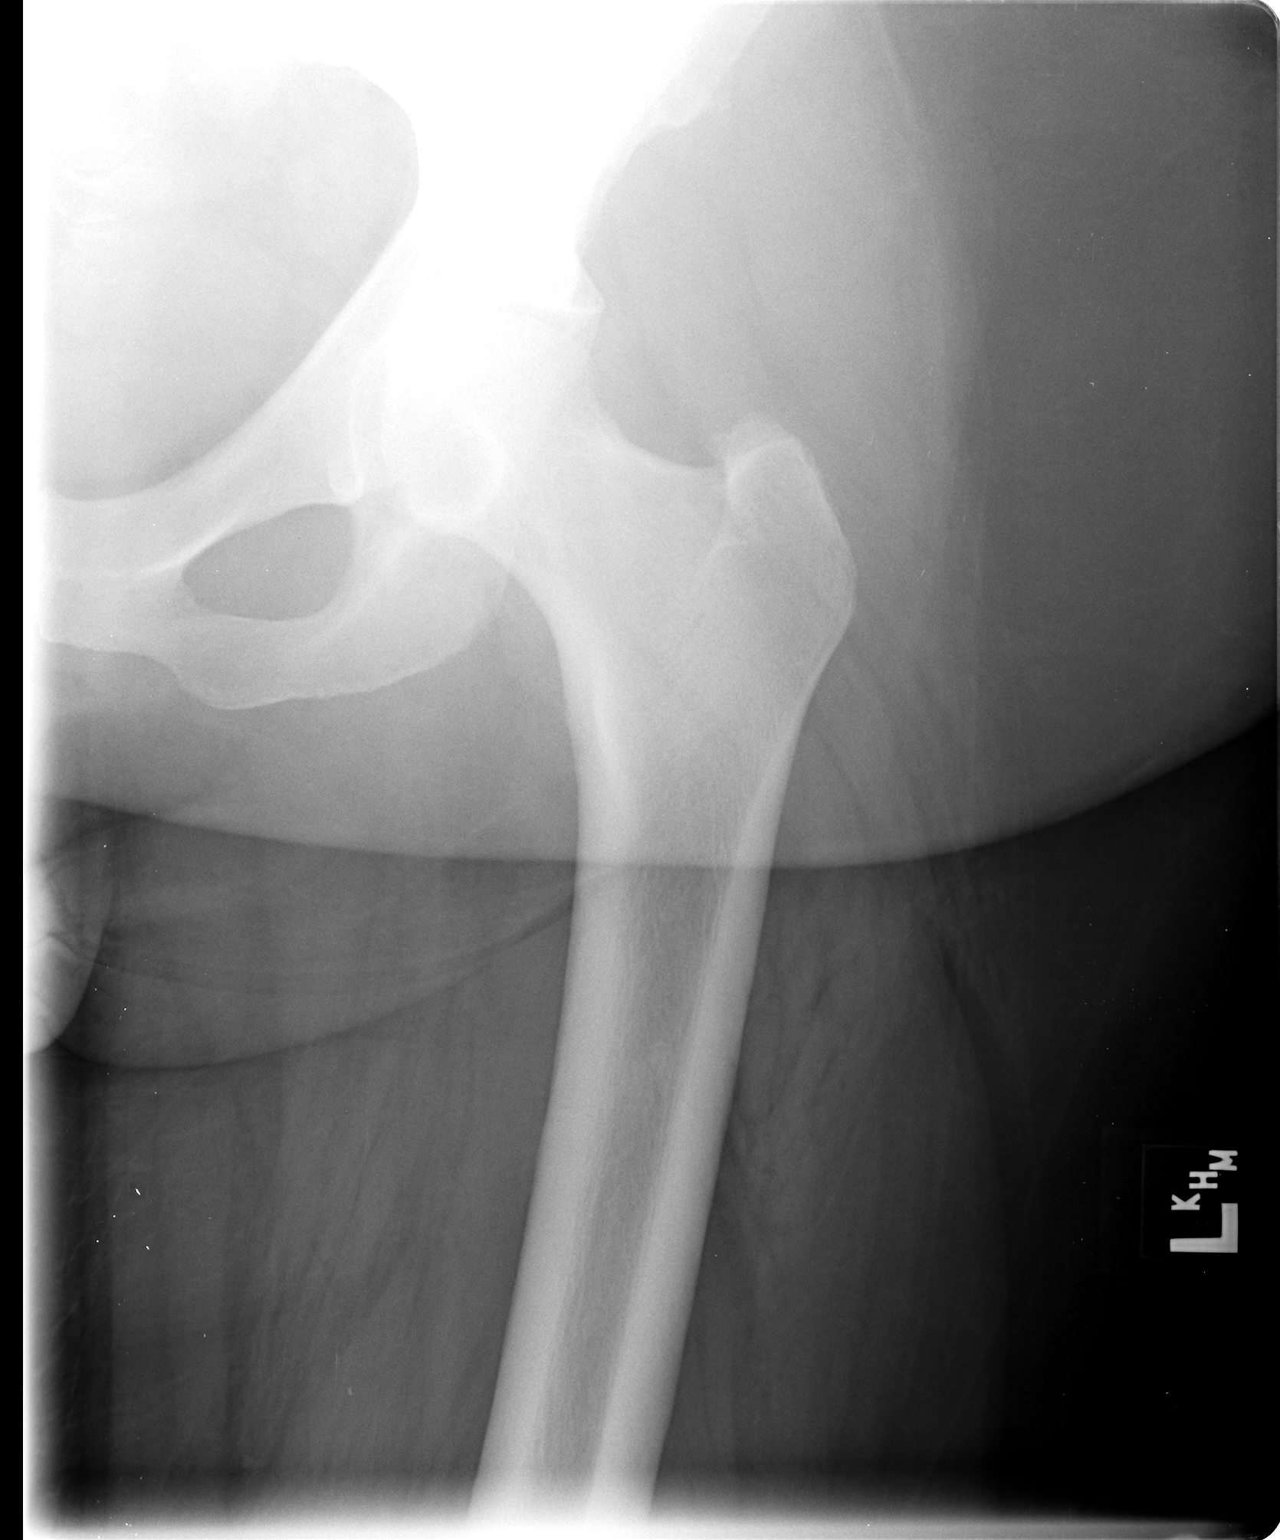

[view not recorded (3 of 3)]
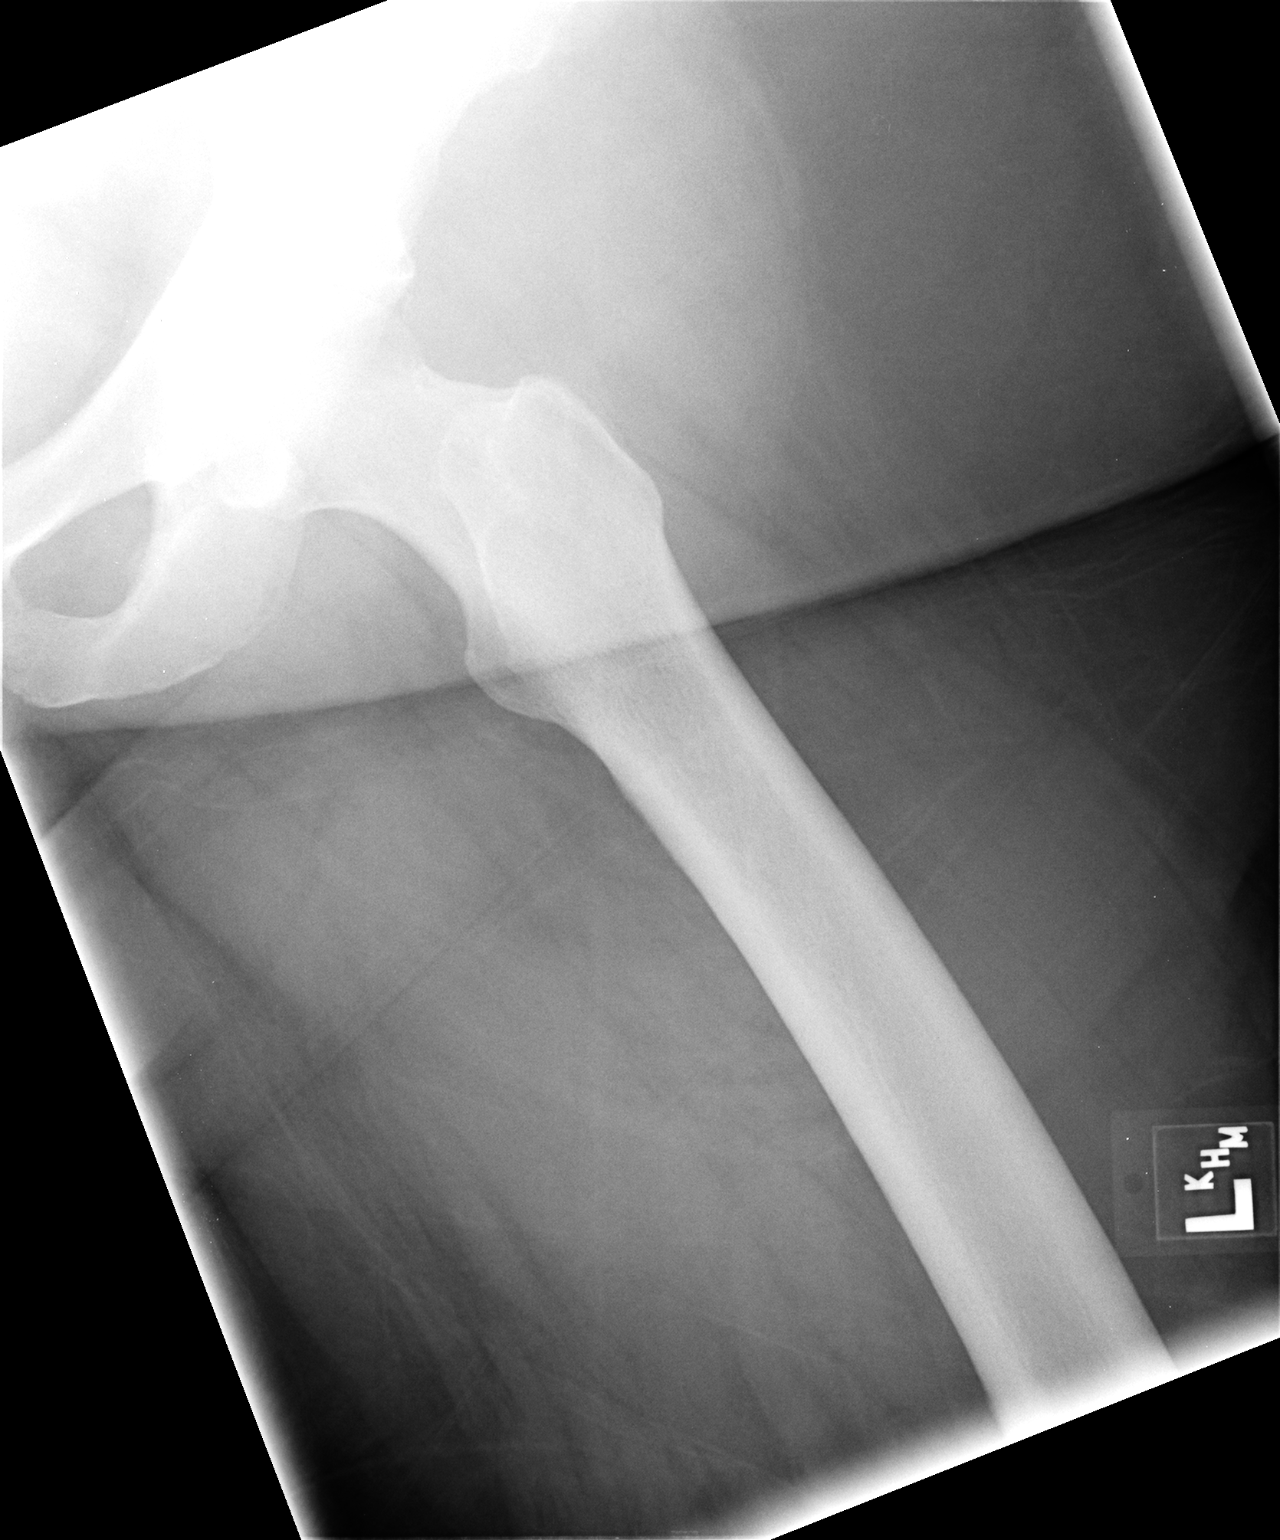

[3 of 3 positions shown; findings below may reference images not displayed]

FINDINGS: No evidence of acute fracture or dislocation.  No bony
lesions or destruction.  The frontal pelvic films show symmetric
mild joint space narrowing of both hips.
IMPRESSION: No acute findings.  Mild joint space narrowing consistent with
osteoarthritis.

## 2013-04-13 ENCOUNTER — Emergency Department (HOSPITAL_COMMUNITY)
Admission: EM | Admit: 2013-04-13 | Discharge: 2013-04-13 | Disposition: A | Discharge status: Home / Self Care | Payer: Medicare Other | Attending: Emergency Medicine | Admitting: Emergency Medicine

## 2013-04-13 ENCOUNTER — Encounter (HOSPITAL_COMMUNITY): Payer: Self-pay | Admitting: *Deleted

## 2013-04-13 ENCOUNTER — Emergency Department (HOSPITAL_COMMUNITY): Payer: Medicare Other

## 2013-04-13 DIAGNOSIS — M7989 Other specified soft tissue disorders: Secondary | ICD-10-CM | POA: Insufficient documentation

## 2013-04-13 DIAGNOSIS — M25539 Pain in unspecified wrist: Secondary | ICD-10-CM | POA: Insufficient documentation

## 2013-04-13 DIAGNOSIS — M25521 Pain in right elbow: Secondary | ICD-10-CM

## 2013-04-13 DIAGNOSIS — Z8639 Personal history of other endocrine, nutritional and metabolic disease: Secondary | ICD-10-CM | POA: Insufficient documentation

## 2013-04-13 DIAGNOSIS — F17200 Nicotine dependence, unspecified, uncomplicated: Secondary | ICD-10-CM | POA: Insufficient documentation

## 2013-04-13 DIAGNOSIS — E7800 Pure hypercholesterolemia, unspecified: Secondary | ICD-10-CM | POA: Insufficient documentation

## 2013-04-13 DIAGNOSIS — E119 Type 2 diabetes mellitus without complications: Secondary | ICD-10-CM | POA: Insufficient documentation

## 2013-04-13 DIAGNOSIS — Z862 Personal history of diseases of the blood and blood-forming organs and certain disorders involving the immune mechanism: Secondary | ICD-10-CM | POA: Insufficient documentation

## 2013-04-13 DIAGNOSIS — Z79899 Other long term (current) drug therapy: Secondary | ICD-10-CM | POA: Insufficient documentation

## 2013-04-13 HISTORY — DX: Gout, unspecified: M10.9

## 2013-04-13 MED ORDER — OXYCODONE-ACETAMINOPHEN 5-325 MG PO TABS
1.0000 | ORAL_TABLET | Freq: Once | ORAL | Status: AC
  Administered 2013-04-13: 1 via ORAL
  Filled 2013-04-13: qty 1

## 2013-04-13 MED ORDER — HYDROCODONE-ACETAMINOPHEN 5-325 MG PO TABS: ORAL_TABLET | ORAL | Status: DC

## 2013-04-13 NOTE — ED Notes (Signed)
Pain , swelling rt arm.No known injury Hx of gout in lt arm.

## 2013-04-13 NOTE — ED Provider Notes (Signed)
History     CSN: [id number]  Arrival date & time 04/13/13  1723   First MD Initiated Contact with Patient 04/13/13 1817      Chief Complaint  Patient presents with  . Arm Pain     HPI Pt was seen at 1830.  Per pt, c/o gradual onset and persistence of constant generalized right elbow "pain" that began 2 days ago. States she feels her elbow area is "swollen."  Pt states the pain worsens when she moves her elbow. States she woke up with symptoms and cannot recall specific injury, but endorses she "might have bumped it."  Denies rash, no fevers, no focal motor weakness, no tingling/numbness in extremity.     Past Medical History  Diagnosis Date  . Diabetes mellitus   . High cholesterol   . Gout     Past Surgical History  Procedure Laterality Date  . Nephrectomy    . Breast surgery      History  Substance Use Topics  . Smoking status: Current Every Day Smoker  . Smokeless tobacco: Not on file  . Alcohol Use: Yes     Comment: daily      Review of Systems ROS: Statement: All systems negative except as marked or noted in the HPI; Constitutional: Negative for fever and chills. ; ; Eyes: Negative for eye pain, redness and discharge. ; ; ENMT: Negative for ear pain, hoarseness, nasal congestion, sinus pressure and sore throat. ; ; Cardiovascular: Negative for chest pain, palpitations, diaphoresis, dyspnea and peripheral edema. ; ; Respiratory: Negative for cough, wheezing and stridor. ; ; Gastrointestinal: Negative for nausea, vomiting, diarrhea, abdominal pain, blood in stool, hematemesis, jaundice and rectal bleeding. . ; ; Genitourinary: Negative for dysuria, flank pain and hematuria. ; ; Musculoskeletal: +right elbow pain. Negative for back pain and neck pain. Negative for deformity and trauma.; ; Skin: Negative for pruritus, rash, abrasions, blisters, bruising and skin lesion.; ; Neuro: Negative for headache, lightheadedness and neck stiffness. Negative for weakness, altered  level of consciousness , altered mental status, extremity weakness, paresthesias, involuntary movement, seizure and syncope.      Allergies  Review of patient's allergies indicates no known allergies.  Home Medications   Current Outpatient Rx  Name  Route  Sig  Dispense  Refill  . albuterol (PROVENTIL) (2.5 MG/3ML) 0.083% nebulizer solution   Nebulization   Take 2.5 mg by nebulization every 6 (six) hours as needed. For wheezing         . atorvastatin (LIPITOR) 20 MG tablet   Oral   Take 20 mg by mouth daily.           . colesevelam (WELCHOL) 625 MG tablet   Oral   Take 1,250 mg by mouth 2 (two) times daily. Only takes once daily         . lisinopril (PRINIVIL,ZESTRIL) 10 MG tablet   Oral   Take 10 mg by mouth daily.         . metFORMIN (GLUCOPHAGE) 500 MG tablet   Oral   Take 500 mg by mouth 2 (two) times daily.         Marland Kitchen HYDROcodone-acetaminophen (NORCO/VICODIN) 5-325 MG per tablet      1 or 2 tabs PO q6 hours prn pain   20 tablet   0     BP 112/58  Pulse 88  Temp(Src) 98.8 F (37.1 C) (Oral)  Resp 20  Ht 5' (1.524 m)  Wt 200 lb (90.719 kg)  BMI  39.06 kg/m2  SpO2 100%  LMP 03/19/2013  Physical Exam 1835: Physical examination:  Nursing notes reviewed; Vital signs and O2 SAT reviewed;  Constitutional: Well developed, Well nourished, Well hydrated, In no acute distress; Head:  Normocephalic, atraumatic; Eyes: EOMI, PERRL, No scleral icterus; ENMT: Mouth and pharynx normal, Mucous membranes moist; Neck: Supple, Full range of motion, No lymphadenopathy; Cardiovascular: Regular rate and rhythm, No murmur, rub, or gallop; Respiratory: Breath sounds clear & equal bilaterally, No rales, rhonchi, wheezes.  Speaking full sentences with ease, Normal respiratory effort/excursion; Chest: Nontender, Movement normal; Abdomen: Soft, Nontender, Nondistended, Normal bowel sounds;; Extremities: Pulses normal, +right elbow with generalized tenderness to palp, no specific  area of point tenderness. No edema, no erythema, no warmth, no ecchymosis, no open wounds. Strong right radial pulse, NMS intact right hand, muscles compartments soft. NT right shoulder/wrist/hand.; Neuro: AA&Ox3, Major CN grossly intact.  Speech clear. Climbs on and off stretcher easily by herself. Gait steady. No gross focal motor or sensory deficits in extremities.; Skin: Color normal, Warm, Dry.    ED Course  Procedures     MDM  MDM Reviewed: previous chart, nursing note and vitals Interpretation: x-ray   Dg Elbow Complete Right 04/13/2013   *RADIOLOGY REPORT*  Clinical Data: Right elbow pain  RIGHT ELBOW - COMPLETE 3+ VIEW  Comparison: None.  Findings: No definite fracture is seen.  Moderate degenerative changes.  Large elbow joint effusion.  IMPRESSION: Large elbow joint effusion, raising the possibility of an occult/nonvisualized fracture.  No definite fracture is seen.  Moderate degenerative changes.   Original Report Authenticated By: Julian Hy, M.D.    CT right elbow: prelim reading by Dr. Kris Hartmann: elbow joint effusion and severe osteoarthritis, no acute fracture identified.   2230:  Pt states she doesn't want to stay in the ED any longer and wants to go home right now. Feels better after pain meds.  Will tx symptomatically with sling and pain meds, f/u Ortho MD on Monday.  Dx and testing d/w pt and family.  Questions answered.  Verb understanding, agreeable to d/c home with outpt f/u.        Alfonzo Feller, DO 04/16/13 1655

## 2014-01-29 ENCOUNTER — Other Ambulatory Visit (HOSPITAL_COMMUNITY): Payer: Self-pay | Admitting: Family Medicine

## 2014-01-29 DIAGNOSIS — N6452 Nipple discharge: Secondary | ICD-10-CM

## 2014-02-06 ENCOUNTER — Other Ambulatory Visit (HOSPITAL_COMMUNITY): Payer: Self-pay | Admitting: Family Medicine

## 2014-02-06 ENCOUNTER — Ambulatory Visit (HOSPITAL_COMMUNITY)
Admission: RE | Admit: 2014-02-06 | Discharge: 2014-02-06 | Disposition: A | Discharge status: Home / Self Care | Payer: Medicare Other | Source: Ambulatory Visit | Attending: Family Medicine | Admitting: Family Medicine

## 2014-02-06 DIAGNOSIS — N6452 Nipple discharge: Secondary | ICD-10-CM

## 2014-02-06 DIAGNOSIS — N6459 Other signs and symptoms in breast: Secondary | ICD-10-CM | POA: Insufficient documentation

## 2014-02-13 ENCOUNTER — Other Ambulatory Visit (HOSPITAL_COMMUNITY): Payer: Self-pay | Admitting: Family Medicine

## 2014-02-13 ENCOUNTER — Ambulatory Visit (HOSPITAL_COMMUNITY)
Admission: RE | Admit: 2014-02-13 | Discharge: 2014-02-13 | Disposition: A | Discharge status: Home / Self Care | Payer: Medicare Other | Source: Ambulatory Visit | Attending: Family Medicine | Admitting: Family Medicine

## 2014-02-13 DIAGNOSIS — N6459 Other signs and symptoms in breast: Secondary | ICD-10-CM | POA: Insufficient documentation

## 2014-02-13 DIAGNOSIS — N6452 Nipple discharge: Secondary | ICD-10-CM

## 2014-02-14 ENCOUNTER — Encounter (HOSPITAL_COMMUNITY): Payer: Medicare Other

## 2014-02-20 ENCOUNTER — Ambulatory Visit (HOSPITAL_COMMUNITY): Payer: Medicare Other

## 2014-03-06 ENCOUNTER — Ambulatory Visit (HOSPITAL_COMMUNITY)
Admission: RE | Admit: 2014-03-06 | Discharge: 2014-03-06 | Disposition: A | Discharge status: Home / Self Care | Payer: Medicare Other | Source: Ambulatory Visit | Attending: Family Medicine | Admitting: Family Medicine

## 2014-03-06 ENCOUNTER — Other Ambulatory Visit: Payer: Self-pay | Admitting: Family Medicine

## 2014-03-06 ENCOUNTER — Other Ambulatory Visit (HOSPITAL_COMMUNITY): Payer: Self-pay | Admitting: Family Medicine

## 2014-03-06 ENCOUNTER — Encounter (HOSPITAL_COMMUNITY): Payer: Self-pay

## 2014-03-06 DIAGNOSIS — IMO0002 Reserved for concepts with insufficient information to code with codable children: Secondary | ICD-10-CM

## 2014-03-06 DIAGNOSIS — N6452 Nipple discharge: Secondary | ICD-10-CM

## 2014-03-06 DIAGNOSIS — R229 Localized swelling, mass and lump, unspecified: Principal | ICD-10-CM

## 2014-03-06 DIAGNOSIS — N63 Unspecified lump in unspecified breast: Secondary | ICD-10-CM | POA: Insufficient documentation

## 2014-03-06 MED ORDER — LIDOCAINE HCL (PF) 2 % IJ SOLN
INTRAMUSCULAR | Status: AC
  Filled 2014-03-06: qty 10

## 2014-03-06 MED ORDER — LIDOCAINE HCL (PF) 2 % IJ SOLN
10.0000 mL | Freq: Once | INTRAMUSCULAR | Status: DC
Start: 2014-03-06 — End: 2014-03-07

## 2014-03-06 NOTE — Discharge Instructions (Signed)
Breast Biopsy °Care After °These instructions give you information on caring for yourself after your procedure. Your doctor may also give you more specific instructions. Call your doctor if you have any problems or questions after your procedure. °HOME CARE °· Only take medicine as told by your doctor. °· Do not take aspirin. °· Keep your sutures (stitches) dry when bathing. °· Protect the biopsy area. Do not let the area get bumped. °· Avoid activities that could pull the biopsy site open until your doctor approves. This includes: °· Stretching. °· Reaching. °· Exercise. °· Sports. °· Lifting more than 3lb. °· Continue your normal diet. °· Wear a good support bra for as long as told by your doctor. °· Change any bandages (dressings) as told by your doctor. °· Do not drink alcohol while taking pain medicine. °· Keep all doctor visits as told. Ask when your test results will be ready. Make sure you get your test results. °GET HELP RIGHT AWAY IF:  °· You have a fever. °· You have more bleeding (more than a small spot) from the biopsy site. °· You have trouble breathing. °· You have yellowish-white fluid (pus) coming from the biopsy site. °· You have redness, puffiness (swelling), or more pain in the biopsy site. °· You have a bad smell coming from the biopsy site. °· Your biopsy site opens after sutures, staples, or sticky strips have been removed. °· You have a rash. °· You need stronger medicine. °MAKE SURE YOU: °· Understand these instructions. °· Will watch your condition. °· Will get help right away if you are not doing well or get worse. °Document Released: 08/28/2009 Document Revised: 01/24/2012 Document Reviewed: 12/12/2011 °ExitCare® Patient Information ©2014 [employer]. ° °Breast Biopsy °A breast biopsy is a test during which a sample of tissue is taken from your breast. The breast tissue is looked at under a microscope for cancer cells.  °BEFORE THE PROCEDURE °· Make plans to have someone drive you home  after the test. °· Do not smoke for 2 weeks before the test. Stop smoking, if you smoke. °· Do not drink alcohol for 24 hours before the test. °· Wear a good support bra to the test. °PROCEDURE  °You may be given one of the following: °· A medicine to numb the breast area (local anesthesia). °· A medicine to make you sleep (general anesthesia). °There are different types of breast biopsies. They include: °· Fine-needle aspiration. °· A needle is put into the breast lump. °· The needle takes out fluid and cells from the lump. °· Ultrasound imaging may be used to help find the lump and to put the needle it the right spot. °· Core-needle biopsy. °· A needle is put into the breast lump. °· The needle is put in your breast 3 6 times. °· The needle removes breast tissue. °· An ultrasound image or X-ray is often used to find the right spot to put in the needle. °· Stereotactic biopsy. °· X-rays and a computer are used to study X-ray pictures of the breast lump. °· The computer finds where the needle needs to be put into the breast. °· Tissue samples are taken out. °· Vacuum-assisted biopsy. °· A small cut (incision) is made in your breast. °· A biopsy device is put through the cut and into the breast tissue. °· The biopsy device draws abnormal breast tissue into the biopsy device. °· A large tissue sample is often removed. °· No stitches are needed. °· Ultrasound-guided core-needle   biopsy. °· Ultrasound imaging helps guide the needle into the area of the breast that is not normal. °· A cut is made in the breast. The needle is put in the needle. °· Tissue samples are taken out. °· Open biopsy. °· A large cut is made in the breast. °· Your doctor will try to remove the whole breast lump or as much as possible. °All tissue, fluid, or cell samples are looked at under a microscope.  °AFTER THE PROCEDURE °· You will be taken to an area to recover. You will be able to go home once you are doing well and are without  problems. °· You may have bruising on your breast. This is normal. °· A pressure bandage (dressing) may be put on your breast for 24 8 hours. This type of bandage is wrapped tightly around your chest. It helps stop fluid from building up underneath tissues. °Document Released: 01/24/2012 Document Reviewed: 01/24/2012 °ExitCare® Patient Information ©2014 [employer]. ° °

## 2014-05-21 ENCOUNTER — Other Ambulatory Visit (HOSPITAL_COMMUNITY): Payer: Self-pay | Admitting: Family Medicine

## 2014-05-21 ENCOUNTER — Ambulatory Visit (HOSPITAL_COMMUNITY)
Admission: RE | Admit: 2014-05-21 | Discharge: 2014-05-21 | Disposition: A | Discharge status: Home / Self Care | Payer: Medicare Other | Source: Ambulatory Visit | Attending: Family Medicine | Admitting: Family Medicine

## 2014-05-21 DIAGNOSIS — M7730 Calcaneal spur, unspecified foot: Secondary | ICD-10-CM | POA: Diagnosis not present

## 2014-05-21 DIAGNOSIS — M19079 Primary osteoarthritis, unspecified ankle and foot: Secondary | ICD-10-CM | POA: Insufficient documentation

## 2014-08-13 ENCOUNTER — Other Ambulatory Visit: Payer: Self-pay | Admitting: Family Medicine

## 2014-08-13 DIAGNOSIS — R928 Other abnormal and inconclusive findings on diagnostic imaging of breast: Secondary | ICD-10-CM

## 2014-09-10 ENCOUNTER — Encounter (HOSPITAL_COMMUNITY): Payer: Medicare Other

## 2014-09-24 ENCOUNTER — Encounter (HOSPITAL_COMMUNITY): Payer: Medicare Other

## 2014-09-24 ENCOUNTER — Other Ambulatory Visit (HOSPITAL_COMMUNITY): Payer: Self-pay | Admitting: Family Medicine

## 2014-09-24 ENCOUNTER — Ambulatory Visit (HOSPITAL_COMMUNITY)
Admission: RE | Admit: 2014-09-24 | Discharge: 2014-09-24 | Disposition: A | Discharge status: Home / Self Care | Payer: Medicare Other | Source: Ambulatory Visit | Attending: Family Medicine | Admitting: Family Medicine

## 2014-09-24 DIAGNOSIS — R928 Other abnormal and inconclusive findings on diagnostic imaging of breast: Secondary | ICD-10-CM | POA: Insufficient documentation

## 2014-09-24 DIAGNOSIS — N6320 Unspecified lump in the left breast, unspecified quadrant: Secondary | ICD-10-CM

## 2014-09-25 NOTE — Progress Notes (Signed)
Yes, Judy from radiology did send this to the correct doctor.

## 2014-10-08 ENCOUNTER — Ambulatory Visit (HOSPITAL_COMMUNITY)
Admission: RE | Admit: 2014-10-08 | Discharge: 2014-10-08 | Disposition: A | Discharge status: Home / Self Care | Payer: Medicare Other | Source: Ambulatory Visit | Attending: Family Medicine | Admitting: Family Medicine

## 2014-10-08 ENCOUNTER — Other Ambulatory Visit (HOSPITAL_COMMUNITY): Payer: Self-pay | Admitting: Family Medicine

## 2014-10-08 DIAGNOSIS — N63 Unspecified lump in breast: Secondary | ICD-10-CM | POA: Diagnosis present

## 2014-10-08 DIAGNOSIS — N6320 Unspecified lump in the left breast, unspecified quadrant: Secondary | ICD-10-CM

## 2015-02-04 ENCOUNTER — Other Ambulatory Visit (HOSPITAL_COMMUNITY): Payer: Self-pay | Admitting: Family Medicine

## 2015-02-04 DIAGNOSIS — Z09 Encounter for follow-up examination after completed treatment for conditions other than malignant neoplasm: Secondary | ICD-10-CM

## 2015-02-04 DIAGNOSIS — N6320 Unspecified lump in the left breast, unspecified quadrant: Secondary | ICD-10-CM

## 2015-02-11 ENCOUNTER — Ambulatory Visit (HOSPITAL_COMMUNITY)
Admission: RE | Admit: 2015-02-11 | Discharge: 2015-02-11 | Disposition: A | Discharge status: Home / Self Care | Payer: Medicare Other | Source: Ambulatory Visit | Attending: Family Medicine | Admitting: Family Medicine

## 2015-02-11 ENCOUNTER — Other Ambulatory Visit (HOSPITAL_COMMUNITY): Payer: Self-pay | Admitting: Family Medicine

## 2015-02-11 DIAGNOSIS — N6310 Unspecified lump in the right breast, unspecified quadrant: Secondary | ICD-10-CM

## 2015-02-11 DIAGNOSIS — N63 Unspecified lump in breast: Secondary | ICD-10-CM | POA: Insufficient documentation

## 2015-02-11 DIAGNOSIS — Z09 Encounter for follow-up examination after completed treatment for conditions other than malignant neoplasm: Secondary | ICD-10-CM

## 2015-02-11 DIAGNOSIS — N6320 Unspecified lump in the left breast, unspecified quadrant: Secondary | ICD-10-CM

## 2015-07-31 ENCOUNTER — Ambulatory Visit (HOSPITAL_COMMUNITY)
Admission: RE | Admit: 2015-07-31 | Discharge: 2015-07-31 | Disposition: A | Discharge status: Home / Self Care | Payer: Medicare Other | Source: Ambulatory Visit | Attending: Family Medicine | Admitting: Family Medicine

## 2015-07-31 ENCOUNTER — Other Ambulatory Visit (HOSPITAL_COMMUNITY): Payer: Self-pay | Admitting: Family Medicine

## 2015-07-31 DIAGNOSIS — M25572 Pain in left ankle and joints of left foot: Secondary | ICD-10-CM | POA: Insufficient documentation

## 2015-07-31 DIAGNOSIS — M19071 Primary osteoarthritis, right ankle and foot: Secondary | ICD-10-CM

## 2015-07-31 DIAGNOSIS — M79671 Pain in right foot: Secondary | ICD-10-CM | POA: Diagnosis present

## 2015-07-31 DIAGNOSIS — M7731 Calcaneal spur, right foot: Secondary | ICD-10-CM | POA: Insufficient documentation

## 2015-11-26 ENCOUNTER — Emergency Department (HOSPITAL_COMMUNITY): Payer: Medicare Other

## 2015-11-26 ENCOUNTER — Emergency Department (HOSPITAL_COMMUNITY)
Admission: EM | Admit: 2015-11-26 | Discharge: 2015-11-26 | Disposition: A | Discharge status: Home / Self Care | Payer: Medicare Other | Attending: Emergency Medicine | Admitting: Emergency Medicine

## 2015-11-26 ENCOUNTER — Encounter (HOSPITAL_COMMUNITY): Payer: Self-pay

## 2015-11-26 DIAGNOSIS — R0789 Other chest pain: Secondary | ICD-10-CM | POA: Insufficient documentation

## 2015-11-26 DIAGNOSIS — Z7984 Long term (current) use of oral hypoglycemic drugs: Secondary | ICD-10-CM | POA: Diagnosis not present

## 2015-11-26 DIAGNOSIS — E119 Type 2 diabetes mellitus without complications: Secondary | ICD-10-CM | POA: Insufficient documentation

## 2015-11-26 DIAGNOSIS — Z79899 Other long term (current) drug therapy: Secondary | ICD-10-CM | POA: Insufficient documentation

## 2015-11-26 DIAGNOSIS — Z87891 Personal history of nicotine dependence: Secondary | ICD-10-CM | POA: Insufficient documentation

## 2015-11-26 DIAGNOSIS — R05 Cough: Secondary | ICD-10-CM | POA: Diagnosis not present

## 2015-11-26 DIAGNOSIS — Z8739 Personal history of other diseases of the musculoskeletal system and connective tissue: Secondary | ICD-10-CM | POA: Insufficient documentation

## 2015-11-26 DIAGNOSIS — E7800 Pure hypercholesterolemia, unspecified: Secondary | ICD-10-CM | POA: Diagnosis not present

## 2015-11-26 DIAGNOSIS — R079 Chest pain, unspecified: Secondary | ICD-10-CM | POA: Diagnosis present

## 2015-11-26 LAB — CBC WITH DIFFERENTIAL/PLATELET
BASOS PCT: 0 %
Basophils Absolute: 0 10*3/uL (ref 0.0–0.1)
Eosinophils Absolute: 0.5 10*3/uL (ref 0.0–0.7)
Eosinophils Relative: 5 %
HCT: 35.6 % — ABNORMAL LOW (ref 36.0–46.0)
Hemoglobin: 11.9 g/dL — ABNORMAL LOW (ref 12.0–15.0)
Lymphocytes Relative: 28 %
Lymphs Abs: 2.5 10*3/uL (ref 0.7–4.0)
MCH: 29.8 pg (ref 26.0–34.0)
MCHC: 33.4 g/dL (ref 30.0–36.0)
MCV: 89.2 fL (ref 78.0–100.0)
Monocytes Absolute: 0.5 10*3/uL (ref 0.1–1.0)
Monocytes Relative: 6 %
NEUTROS ABS: 5.5 10*3/uL (ref 1.7–7.7)
NEUTROS PCT: 61 %
PLATELETS: 225 10*3/uL (ref 150–400)
RBC: 3.99 MIL/uL (ref 3.87–5.11)
RDW: 15 % (ref 11.5–15.5)
WBC: 9 10*3/uL (ref 4.0–10.5)

## 2015-11-26 LAB — COMPREHENSIVE METABOLIC PANEL
ALT: 17 U/L (ref 14–54)
AST: 20 U/L (ref 15–41)
Albumin: 3.5 g/dL (ref 3.5–5.0)
Alkaline Phosphatase: 104 U/L (ref 38–126)
Anion gap: 13 (ref 5–15)
BILIRUBIN TOTAL: 0.3 mg/dL (ref 0.3–1.2)
BUN: 11 mg/dL (ref 6–20)
CHLORIDE: 98 mmol/L — AB (ref 101–111)
CO2: 23 mmol/L (ref 22–32)
CREATININE: 1.17 mg/dL — AB (ref 0.44–1.00)
Calcium: 8.5 mg/dL — ABNORMAL LOW (ref 8.9–10.3)
GFR calc Af Amer: >60 mL/min (ref >60)
GFR, EST NON AFRICAN AMERICAN: 55 mL/min — AB (ref >60)
Glucose, Bld: 121 mg/dL — ABNORMAL HIGH (ref 65–99)
POTASSIUM: 3.6 mmol/L (ref 3.5–5.1)
Sodium: 134 mmol/L — ABNORMAL LOW (ref 135–145)
Total Protein: 7.6 g/dL (ref 6.5–8.1)

## 2015-11-26 LAB — TROPONIN I

## 2015-11-26 MED ORDER — HYDROCODONE-ACETAMINOPHEN 5-325 MG PO TABS
1.0000 | ORAL_TABLET | Freq: Once | ORAL | Status: AC
  Administered 2015-11-26: 1 via ORAL
  Filled 2015-11-26: qty 1

## 2015-11-26 MED ORDER — METHOCARBAMOL 500 MG PO TABS: 1000.0000 mg | ORAL_TABLET | Freq: Four times a day (QID) | ORAL | Status: DC | PRN

## 2015-11-26 MED ORDER — HYDROCODONE-ACETAMINOPHEN 5-325 MG PO TABS: ORAL_TABLET | ORAL | Status: DC

## 2015-11-26 NOTE — Discharge Instructions (Signed)
°Emergency Department Resource Guide °1) Find a Doctor and Pay Out of Pocket °Although you won't have to find out who is covered by your insurance plan, it is a good idea to ask around and get recommendations. You will then need to call the office and see if the doctor you have chosen will accept you as a new patient and what types of options they offer for patients who are self-pay. Some doctors offer discounts or will set up payment plans for their patients who do not have insurance, but you will need to ask so you aren't surprised when you get to your appointment. ° °2) Contact Your Local Health Department °Not all health departments have doctors that can see patients for sick visits, but many do, so it is worth a call to see if yours does. If you don't know where your local health department is, you can check in your phone book. The CDC also has a tool to help you locate your state's health department, and many state websites also have listings of all of their local health departments. ° °3) Find a Walk-in Clinic °If your illness is not likely to be very severe or complicated, you may want to try a walk in clinic. These are popping up all over the country in pharmacies, drugstores, and shopping centers. They're usually staffed by nurse practitioners or physician assistants that have been trained to treat common illnesses and complaints. They're usually fairly quick and inexpensive. However, if you have serious medical issues or chronic medical problems, these are probably not your best option. ° °No Primary Care Doctor: °- Call [facility] at  832-8000 - they can help you locate a primary care doctor that  accepts your insurance, provides certain services, etc. °- Physician Referral Service- 1-800-533-3463 ° °Chronic Pain Problems: °Organization         Address  Phone   Notes  °[facility]  ([phone]) [phone] Patients need to be referred by their primary care doctor.  ° °Medication  Assistance: °Organization         Address  Phone   Notes  °[place] [facility] [address] °[place], [place] [address] (336) 641-8030 --Must be a resident of [place] °-- Must have NO insurance coverage whatsoever (no Medicaid/ Medicare, etc.) °-- The pt. MUST have a primary care doctor that directs their care regularly and follows them in the community °  °[facility]  (866) 331-1348   °[employer]  (888) 892-1162   ° °Agencies that provide inexpensive medical care: °Organization         Address  Phone   Notes  °[facility]  (336) 832-8035   °[facility]    (336) 832-7272   °[facility] [address] °[place], [place] [address] (336) 832-4777   °[facility] [address], °[place] (336) 271-4999   °[facility]    (336) 373-0678   °[facility]    (336) 272-1050   °[facility] ° [address],  Phone:  (336) 832-4444, Fax:  (336) 832-4440 Hours of Operation:  9 am - 6 pm, M-F.  Also accepts Medicaid/Medicare and self-pay.  °[facility] ° [address],  Phone: (336) 832-3150, Fax: (336) [phone]. Hours of Operation:  8:30 am - 5:30 pm, M-F.  Also accepts Medicaid and self-pay.  °[facility] [place] [address], [place] Phone: (336) 878-6027   °[facility] [address], [place], [place] (336)723-1848, Ext. 123 Mondays & Thursdays: 7-9 AM.  First 15 patients are seen on a first come, first serve basis. °  ° °Medicaid-accepting [place] Providers: ° °Organization         Address  Phone   Notes  °[facility] [address], [place] (336) 641-2100 Also accepts self-pay patients.  °[facility] [address], [place] ° (336) 856-9996   °[facility] [address], [place]  (336) 288-8857   °[facility] [address] [place] [address], [place] (336) 299-7000   °Veita Bland [address], [place]  ° (336) 373-1557 Only accepts [place] Access Medicaid patients after they have their name applied to their card.  ° °Self-Pay (no insurance) in [place]: ° °Organization         Address  Phone   Notes  °Sickle Cell Patients, [facility] [address], [place] (336) 832-1970   °[facility] [address], [place] (336) 832-4400   °[facility] ° [address], [place] (336) 992-4800   °[facility]/Dr. Osei-Bonsu ° [address] [place] [address], [place] or [address], [place] (336) [phone] Phone number for both [place] and [place] locations is the same.  °[facility] [address], [place] (336) 299-0000   °[facility] [address] [place] [address], [place] or [address] (336) 852-7530 °(336) 878-2260   °[facility] [address], [place] (336) 350-1642, phone; (336) 294-5005, fax Sees patients 1st and 3rd Saturday of every month.  Must not qualify for public or private insurance (i.e. Medicaid, Medicare, [place] Health Choice, Veterans' Benefits) • Household income should be no more than 200% of the poverty level •The clinic cannot treat you if you are pregnant or think you are pregnant • Sexually transmitted diseases are not treated at the clinic.  ° ° °Dental Care: °Organization         Address  Phone  Notes  °[place] [facility] [address], [place] (336) 641-6152 Accepts children up to age 21 who are enrolled in Medicaid or [place] Health Choice; pregnant women with a Medicaid card; and children who have applied for Medicaid or [place] Health Choice, but were declined, whose parents can pay a reduced fee at time of service.  °[place]  [facility] [place]  [address], [place] (336) 641-7733 Accepts children up to age 21 who are enrolled in Medicaid or [place] Health Choice; pregnant women with a Medicaid card; and children who have applied for Medicaid or [place] Health Choice, but were declined, whose parents can pay a reduced fee at time of service.  °[facility] ° [address], [place] (336) 641-4533 Patients are seen by appointment only. Walk-ins are not accepted. [facility] will see patients 18 years of age and older. °Monday - Tuesday (8am-5pm) °Most Wednesdays (8:30-5pm) °$30 per visit, cash only  °[facility] ° [address], [place] (336) 641-4533 Patients are seen by appointment only. Walk-ins are not accepted. [facility] will see patients 18 years of age and older. °One   Wednesday Evening (Monthly: Volunteer Based).  $30 per visit, cash only  °[facility]  (919) 537-3737 for adults; Children under age 4, call Graduate Pediatric Dentistry at (919) [phone]. Children aged 4-14, please call (919) [phone] to request a pediatric application. ° Dental services are provided in all areas of dental care including fillings, crowns and bridges, complete and partial dentures, implants, gum treatment, root canals, and extractions. Preventive care is also provided. Treatment is provided to both adults and children. °Patients are selected via a lottery and there is often a waiting list. °  °[facility] [address], °[place] ° (336) 763-8833 [email or web address] °  °[facility] [address], [place], [place] (336)723-1848, Ext. 123 Second and Fourth Thursday of each month, opens at 6:30 AM; Clinic ends at 9 AM.  Patients are seen on a first-come first-served basis, and a limited number are seen during each clinic.  ° °[facility] ° [address], [place], [place] (336) 723-7904    Eligibility Requirements °You must have lived in [place], [place], or [place] counties for at least the last three months. °  You cannot be eligible for state or federal sponsored healthcare insurance, including Veterans Administration, Medicaid, or Medicare. °  You generally cannot be eligible for healthcare insurance through your employer.  °  How to apply: °Eligibility screenings are held every Tuesday and Wednesday afternoon from 1:00 pm until 4:00 pm. You do not need an appointment for the interview!  °[facility] [address], [place], [place] 336-631-2330   °[facility]  336-342-8273   °[facility]  336-703-3100   °[facility]  336-570-6415   ° °Behavioral Health Resources in the Community: °Intensive Outpatient Programs °Organization         Address  Phone  Notes  °[facility] [address], [place], [place] 336-878-6098   °[facility] [address], [place], [place] 336-832-9800   °[facility] [address], [place], [place] ° 336-882-2125   °[facility] [address],  °[place], [place] 1-800-853-5163 or 336-641-4981   °Substance Abuse Resources °Organization         Address  Phone  Notes  °[facility]  336-882-2125   °[facility]  336-784-9470   °[facility]  336-285-9073   °[facility]  336-845-3988   °Residential & Outpatient Substance Abuse Program  1-800-659-3381   °Psychological Services °Organization         Address  Phone  Notes  °[facility]  336- 832-9600   °[facility]  336- 378-7881   °[facility] [address], [place] 1-800-853-5163 or 336-641-4981   ° °Mobile Crisis Teams °Organization         Address  Phone  Notes  °[facility], Mobile Crisis Care Unit  1-877-626-1772   °Assertive °[facility] ° [address]  [place], [place] 336-834-9664   °Sharon DeEsch [address] °[place] 336-554-5454   ° °Self-Help/Support Groups °Organization         Address  Phone             Notes  °[facility]  - variety of support groups  336- 373-1402 Call for more information  °Narcotics Anonymous (NA), [facility] [address], °[place]  2 meetings at this location  ° °  Residential Treatment Programs Organization         Address  Phone  Notes  [facility] [address]    [place]  1-971-640-3111   [facility]  [address], [place] [id number], [place], [place]   [place], [place] (724)228-4514 Admissions: 8am-3pm M-F  [facility] [place] [address],    [place], [place] [phone]   [facility] [address] [place], [place], [place]   [facility] [address],  [place], [place]   [facility] Intensive Outpatient [address], [place], [place]   [facility] [place]  [place], [place] 1-773-125-3696 or 435-509-0560   [facility] [address] [place], [place] Accepts Medicaid  [facility] [address],  [place] 1-425-466-2726 Substance Abuse/Addiction Treatment   [facility]         Address  Phone  Notes  [facility]  (310)545-0644   Domenic Schwab, PhD [address] [place], [place]   405-699-2309 or 779-536-7116   [facility] [place], [place] [phone]   [facility] [address], [place], [place] (979)571-8666 Insurance/Medicaid/sponsorship through [facility] [address] [place]                                    [place], [place] (939)332-9556 [place] [address], [place] [phone]    Dr. Adele Schilder  [phone]   [facility] Dept. 1) [address] [place] 2) [place] 3)  [place] [address], [place] [phone] 313-577-3232  782-625-2026   [place] [phone] or [phone] (After Hours)     Take the prescriptions as directed.  Apply moist heat or ice to the area(s) of discomfort, for 15 minutes at a time, several times per day for the next few days.  Do not fall asleep on a heating or ice pack.  Call your regular medical doctor tomorrow to schedule a follow up appointment in the next 2 days.  Return to the Emergency Department immediately if worsening.

## 2015-11-26 NOTE — ED Provider Notes (Signed)
CSN: [id number]     Arrival date & time 11/26/15  2016 History   First MD Initiated Contact with Patient 11/26/15 2035     Chief Complaint  Patient presents with  . Chest Pain      HPI  Pt was seen at 2040. Per pt, c/o gradual onset and persistence of constant bilateral anterior chest wall "pain" that began 5 days ago. Describes the pain as constant and "sharp." Has been associated with coughing. Pain worsens with palpation of the area and body position changes. Denies SOB, no palpitations, no abd pain, no N/V/D, no back pain, no fevers, no rash.    Past Medical History  Diagnosis Date  . Diabetes mellitus   . High cholesterol   . Gout    Past Surgical History  Procedure Laterality Date  . Nephrectomy    . Breast surgery      Social History  Substance Use Topics  . Smoking status: Former Research scientist (life sciences)  . Smokeless tobacco: None  . Alcohol Use: Yes     Comment: daily    Review of Systems ROS: Statement: All systems negative except as marked or noted in the HPI; Constitutional: Negative for fever and chills. ; ; Eyes: Negative for eye pain, redness and discharge. ; ; ENMT: Negative for ear pain, hoarseness, nasal congestion, sinus pressure and sore throat. ; ; Cardiovascular:  Negative for palpitations, diaphoresis, dyspnea and peripheral edema. ; ; Respiratory: +cough. Negative for wheezing and stridor. ; ; Gastrointestinal: Negative for nausea, vomiting, diarrhea, abdominal pain, blood in stool, hematemesis, jaundice and rectal bleeding. . ; ; Genitourinary: Negative for dysuria, flank pain and hematuria. ; ; Musculoskeletal: +chest wall pain. Negative for back pain and neck pain. Negative for swelling and trauma.; ; Skin: Negative for pruritus, rash, abrasions, blisters, bruising and skin lesion.; ; Neuro: Negative for headache, lightheadedness and neck stiffness. Negative for weakness, altered level of consciousness , altered mental status, extremity weakness, paresthesias, involuntary  movement, seizure and syncope.      Allergies  Review of patient's allergies indicates no known allergies.  Home Medications   Prior to Admission medications   Medication Sig Start Date End Date Taking? Authorizing Provider  metFORMIN (GLUCOPHAGE) 500 MG tablet Take 500 mg by mouth 2 (two) times daily.   Yes Historical Provider, MD  albuterol (PROVENTIL) (2.5 MG/3ML) 0.083% nebulizer solution Take 2.5 mg by nebulization every 6 (six) hours as needed. For wheezing    Historical Provider, MD  atorvastatin (LIPITOR) 20 MG tablet Take 20 mg by mouth daily.      Historical Provider, MD  colesevelam (WELCHOL) 625 MG tablet Take 1,250 mg by mouth 2 (two) times daily. Only takes once daily    Historical Provider, MD  HYDROcodone-acetaminophen (NORCO/VICODIN) 5-325 MG per tablet 1 or 2 tabs PO q6 hours prn pain Patient not taking: Reported on 11/26/2015 04/13/13   Francine Graven, DO  lisinopril (PRINIVIL,ZESTRIL) 10 MG tablet Take 10 mg by mouth daily.    Historical Provider, MD   BP 111/81 mmHg  Pulse 95  Temp(Src) 98.1 F (36.7 C) (Oral)  Resp 20  SpO2 96%  LMP 09/26/2015 Physical Exam  2045: Physical examination:  Nursing notes reviewed; Vital signs and O2 SAT reviewed;  Constitutional: Well developed, Well nourished, Well hydrated, In no acute distress; Head:  Normocephalic, atraumatic; Eyes: EOMI, PERRL, No scleral icterus; ENMT: Mouth and pharynx normal, Mucous membranes moist; Neck: Supple, Full range of motion, No lymphadenopathy; Cardiovascular: Regular rate and rhythm, No gallop;  Respiratory: Breath sounds clear & equal bilaterally, No wheezes.  Speaking full sentences with ease, Normal respiratory effort/excursion; Chest: +bilat parasternal and anterior chest wall areas tender to palp which reproduces pt's pain. No rash, no deformity, no soft tissue crepitus. Movement normal; Abdomen: Soft, Nontender, Nondistended, Normal bowel sounds; Genitourinary: No CVA tenderness; Extremities:  Pulses normal, No tenderness, No edema, No calf edema or asymmetry.; Neuro: AA&Ox3, Major CN grossly intact.  Speech clear. No gross focal motor or sensory deficits in extremities.; Skin: Color normal, Warm, Dry.   ED Course  Procedures (including critical care time) Labs Review  Imaging Review  I have personally reviewed and evaluated these images and lab results as part of my medical decision-making.   EKG Interpretation   Date/Time:  Wednesday November 26 2015 20:24:09 EST Ventricular Rate:  100 PR Interval:  145 QRS Duration: 89 QT Interval:  371 QTC Calculation: 478 R Axis:   -11 Text Interpretation:  Sinus tachycardia When compared with ECG of  03/11/2012 Rate faster Confirmed by [facility]  MD, Nunzio Cory [phone]) on  11/26/2015 9:16:32 PM      MDM  MDM Reviewed: previous chart, nursing note and vitals Reviewed previous: labs and ECG Interpretation: labs, ECG and x-ray     Results for orders placed or performed during the hospital encounter of 11/26/15  CBC with Differential  Result Value Ref Range   WBC 9.0 4.0 - 10.5 K/uL   RBC 3.99 3.87 - 5.11 MIL/uL   Hemoglobin 11.9 (L) 12.0 - 15.0 g/dL   HCT 35.6 (L) 36.0 - 46.0 %   MCV 89.2 78.0 - 100.0 fL   MCH 29.8 26.0 - 34.0 pg   MCHC 33.4 30.0 - 36.0 g/dL   RDW 15.0 11.5 - 15.5 %   Platelets 225 150 - 400 K/uL   Neutrophils Relative % 61 %   Neutro Abs 5.5 1.7 - 7.7 K/uL   Lymphocytes Relative 28 %   Lymphs Abs 2.5 0.7 - 4.0 K/uL   Monocytes Relative 6 %   Monocytes Absolute 0.5 0.1 - 1.0 K/uL   Eosinophils Relative 5 %   Eosinophils Absolute 0.5 0.0 - 0.7 K/uL   Basophils Relative 0 %   Basophils Absolute 0.0 0.0 - 0.1 K/uL  Comprehensive metabolic panel  Result Value Ref Range   Sodium 134 (L) 135 - 145 mmol/L   Potassium 3.6 3.5 - 5.1 mmol/L   Chloride 98 (L) 101 - 111 mmol/L   CO2 23 22 - 32 mmol/L   Glucose, Bld 121 (H) 65 - 99 mg/dL   BUN 11 6 - 20 mg/dL   Creatinine, Ser 1.17 (H) 0.44 - 1.00 mg/dL    Calcium 8.5 (L) 8.9 - 10.3 mg/dL   Total Protein 7.6 6.5 - 8.1 g/dL   Albumin 3.5 3.5 - 5.0 g/dL   AST 20 15 - 41 U/L   ALT 17 14 - 54 U/L   Alkaline Phosphatase 104 38 - 126 U/L   Total Bilirubin 0.3 0.3 - 1.2 mg/dL   GFR calc non Af Amer 55 (L) >60 mL/min   GFR calc Af Amer >60 >60 mL/min   Anion gap 13 5 - 15  Troponin I  Result Value Ref Range   Troponin I <0.03 <0.031 ng/mL   Dg Chest Portable 1 View 11/26/2015  CLINICAL DATA:  Chest pain for 1 week. EXAM: PORTABLE CHEST 1 VIEW COMPARISON:  03/11/2012 FINDINGS: Low lung volumes noted. Both lungs are clear. Heart size is stable. No  evidence of pneumothorax or pleural effusion. IMPRESSION: Low lung volumes.  No active disease. Electronically Signed   By: Earle Gell M.D.   On: 11/26/2015 20:56    2255:  BUN/Cr and H/H per baseline. Doubt PE as cause for symptoms with low risk [place].  Doubt ACS as cause for symptoms with normal troponin and unchanged EKG from previous after 5 days of constant symptoms. Pt feels better after meds and wants to go home now. Tx symptomatically at this time. Dx and testing d/w pt.  Questions answered.  Verb understanding, agreeable to d/c home with outpt f/u.     Francine Graven, DO 11/29/15 [phone]

## 2015-11-26 NOTE — ED Notes (Signed)
Pt ambulated to restroom independently. No issues.

## 2018-02-28 ENCOUNTER — Encounter: Payer: Self-pay | Admitting: General Surgery

## 2018-02-28 ENCOUNTER — Ambulatory Visit (INDEPENDENT_AMBULATORY_CARE_PROVIDER_SITE_OTHER): Payer: Medicare Other | Admitting: General Surgery

## 2018-02-28 VITALS — BP 167/85 | HR 109 | Temp 97.1°F | Ht 60.0 in | Wt 202.0 lb

## 2018-02-28 DIAGNOSIS — N611 Abscess of the breast and nipple: Secondary | ICD-10-CM

## 2018-02-28 MED ORDER — SULFAMETHOXAZOLE-TRIMETHOPRIM 800-160 MG PO TABS: 1.0000 | ORAL_TABLET | Freq: Two times a day (BID) | ORAL | 1 refills | Status: DC

## 2018-02-28 NOTE — Patient Instructions (Signed)
Mastitis °Mastitis is redness, soreness, and puffiness (inflammation) in an area of the breast. It is often caused by an infection that occurs when bacteria enter the skin. The infection is often helped by antibiotic medicine. °Follow these instructions at home: °· Only take medicines as told by your doctor. °· If your doctor prescribed an antibiotic medicine, take it as told. Finish it even if you start to feel better. °· Do not wear a tight or underwire bra. Wear a soft support bra. °· Drink more fluids, especially if you have a fever. °· If you are breastfeeding: °? Keep emptying the breast. Your doctor can tell you if the milk is safe. Use a breast pump if you are told to stop nursing. °? Keep your nipples clean and dry. °? Empty the first breast before going to the other breast. Use a breast pump if your baby is not emptying your breast. °? If you go back to work, pump your breasts while at work. °? Avoid letting your breasts get overly filled with milk (engorged). °Contact a doctor if: °· You have pus-like fluid leaking from your breast. °· Your symptoms do not get better within 2 days. °Get help right away if: °· Your pain and puffiness are getting worse. °· Your pain is not helped by medicine. °· You have a red line going from your breast toward your armpit. °· You have a fever or lasting symptoms for more than 2-3 days. °· You have a fever and your symptoms suddenly get worse. °This information is not intended to replace advice given to you by your health care provider. Make sure you discuss any questions you have with your health care provider. °Document Released: 10/20/2009 Document Revised: 04/08/2016 Document Reviewed: 06/01/2013 °Elsevier Interactive Patient Education © 2017 [employer] Inc. ° °

## 2018-02-28 NOTE — Progress Notes (Signed)
Courtney Dyer; [id number]; 07/09/1966   HPI Patient is a 51 year old black female who was referred to my care by Dr. Cindie Laroche for evaluation and treatment of drainage from her left breast.  She states that she started having swelling behind the left nipple about 1 month ago.  She states the drainage has decreased since that time.  The mass has also decreased.  She has had a history of breast abscesses bilaterally in the past.  Her last mammogram was in 2016.  She denies any recent fevers or chills.  She currently has 0 out of 10 breast pain.  She denies any history of breast cancer.  Patient has not been on an antibiotic. Past Medical History:  Diagnosis Date  . Diabetes mellitus   . Gout   . High cholesterol     Past Surgical History:  Procedure Laterality Date  . BREAST SURGERY    . NEPHRECTOMY      History reviewed. No pertinent family history.  Current Outpatient Medications on File Prior to Visit  Medication Sig Dispense Refill  . albuterol (PROVENTIL) (2.5 MG/3ML) 0.083% nebulizer solution Take 2.5 mg by nebulization every 6 (six) hours as needed. For wheezing    . atorvastatin (LIPITOR) 20 MG tablet Take 20 mg by mouth daily.      . colesevelam (WELCHOL) 625 MG tablet Take 1,250 mg by mouth 2 (two) times daily. Only takes once daily    . HYDROcodone-acetaminophen (NORCO/VICODIN) 5-325 MG tablet 1 or 2 tabs PO q6 hours prn pain 20 tablet 0  . lisinopril (PRINIVIL,ZESTRIL) 10 MG tablet Take 10 mg by mouth daily.    . metFORMIN (GLUCOPHAGE) 500 MG tablet Take 500 mg by mouth 2 (two) times daily.    . methocarbamol (ROBAXIN) 500 MG tablet Take 2 tablets (1,000 mg total) by mouth 4 (four) times daily as needed for muscle spasms (muscle spasm/pain). 25 tablet 0   No current facility-administered medications on file prior to visit.     No Known Allergies  Social History   Substance and Sexual Activity  Alcohol Use Yes   Comment: daily    Social History   Tobacco Use   Smoking Status Former Smoker  Smokeless Tobacco Never Used    Review of Systems  Constitutional: Negative.   HENT: Negative.   Eyes: Negative.   Respiratory: Negative.   Cardiovascular: Negative.   Gastrointestinal: Negative.   Genitourinary: Negative.   Musculoskeletal: Positive for joint pain.  Skin: Negative.   Neurological: Negative.   Endo/Heme/Allergies: Negative.   Psychiatric/Behavioral: Negative.     Objective   Vitals:   02/28/18 1239  BP: (!) 167/85  Pulse: (!) 109  Temp: (!) 97.1 F (36.2 C)    Physical Exam  Constitutional: She is oriented to person, place, and time. She appears well-developed and well-nourished.  Cardiovascular: Normal rate, regular rhythm and normal heart sounds. Exam reveals no gallop and no friction rub.  No murmur heard. Pulmonary/Chest: Effort normal and breath sounds normal. No stridor. No respiratory distress. She has no wheezes. She has no rales.  Neurological: She is alert and oriented to person, place, and time.  Skin: Skin is warm and dry.  Psychiatric: She has a normal mood and affect.  Vitals reviewed. Breast: Indurated area with a draining small wound at the 2 o'clock position in the periareolar region of the left breast.  Multiple surgical scars are present.  No nipple inversion noted.  The axilla is negative for palpable nodes.  Right breast  reveals no dominant mass, nipple discharge, dimpling.  The axilla is negative for palpable nodes.  Assessment  Left breast abscess, resolving Plan   Bactrim DS 1 tablet p.o. twice daily times 10 days.  A refill has been left on this prescription.  Patient was told to keep the wound clean with soap and water.  We will see her in follow-up in 2 weeks.  Will arrange mammogram at that time since it has been 3 years.

## 2018-03-14 ENCOUNTER — Ambulatory Visit: Payer: Medicare Other | Admitting: General Surgery

## 2018-03-23 ENCOUNTER — Ambulatory Visit: Payer: Medicare Other | Admitting: General Surgery

## 2018-03-30 ENCOUNTER — Ambulatory Visit: Payer: Medicare Other | Admitting: General Surgery

## 2018-04-04 ENCOUNTER — Ambulatory Visit: Payer: Medicare Other | Admitting: General Surgery

## 2018-04-06 ENCOUNTER — Other Ambulatory Visit (HOSPITAL_COMMUNITY): Payer: Self-pay | Admitting: Orthopedic Surgery

## 2018-04-06 ENCOUNTER — Encounter: Payer: Self-pay | Admitting: General Surgery

## 2018-04-06 ENCOUNTER — Ambulatory Visit (INDEPENDENT_AMBULATORY_CARE_PROVIDER_SITE_OTHER): Payer: Medicare Other | Admitting: General Surgery

## 2018-04-06 VITALS — BP 140/100 | HR 99 | Temp 96.8°F | Resp 20 | Wt 200.0 lb

## 2018-04-06 DIAGNOSIS — IMO0002 Reserved for concepts with insufficient information to code with codable children: Secondary | ICD-10-CM

## 2018-04-06 DIAGNOSIS — R229 Localized swelling, mass and lump, unspecified: Principal | ICD-10-CM

## 2018-04-06 DIAGNOSIS — N611 Abscess of the breast and nipple: Secondary | ICD-10-CM | POA: Diagnosis not present

## 2018-04-06 MED ORDER — SILVER SULFADIAZINE 1 % EX CREA: TOPICAL_CREAM | CUTANEOUS | 2 refills | Status: AC

## 2018-04-06 NOTE — Progress Notes (Signed)
Subjective:     Courtney Dyer  Patient states left breast abscess has not resolved.  Wound is open.  She denies any drainage or fevers. Objective:    BP (!) 140/100 (BP Location: Left Arm, Patient Position: Sitting, Cuff Size: Normal)   Pulse 99   Temp (!) 96.8 F (36 C) (Temporal)   Resp 20   Wt 200 lb (90.7 kg)   BMI 39.06 kg/m   General:  alert, cooperative and no distress  Left breast with large area of dry skin with induration and granulation at base of a superficial wound.  Difficult to a certain whether there is a mass.     Assessment:    Left breast abscess with cellulitis, poor healing.    Plan:   We will get diagnostic mammogram for further evaluation.  We will start Silvadene cream to wound.  Follow-up here after mammogram.

## 2018-04-18 ENCOUNTER — Encounter (HOSPITAL_COMMUNITY): Payer: Self-pay

## 2018-04-18 ENCOUNTER — Ambulatory Visit (HOSPITAL_COMMUNITY)
Admission: RE | Admit: 2018-04-18 | Discharge: 2018-04-18 | Disposition: A | Discharge status: Home / Self Care | Payer: Medicare Other | Source: Ambulatory Visit | Attending: Orthopedic Surgery | Admitting: Orthopedic Surgery

## 2018-04-18 ENCOUNTER — Encounter (HOSPITAL_COMMUNITY): Payer: Medicare Other

## 2018-04-18 DIAGNOSIS — N611 Abscess of the breast and nipple: Secondary | ICD-10-CM | POA: Diagnosis not present

## 2018-04-18 DIAGNOSIS — R229 Localized swelling, mass and lump, unspecified: Secondary | ICD-10-CM | POA: Diagnosis not present

## 2018-04-18 DIAGNOSIS — IMO0002 Reserved for concepts with insufficient information to code with codable children: Secondary | ICD-10-CM

## 2018-04-27 ENCOUNTER — Ambulatory Visit: Payer: Medicare Other | Admitting: General Surgery

## 2018-05-04 ENCOUNTER — Encounter: Payer: Self-pay | Admitting: General Surgery

## 2018-05-04 ENCOUNTER — Ambulatory Visit (INDEPENDENT_AMBULATORY_CARE_PROVIDER_SITE_OTHER): Payer: Medicare Other | Admitting: General Surgery

## 2018-05-04 VITALS — BP 144/106 | HR 103 | Temp 96.8°F | Resp 18 | Wt 204.0 lb

## 2018-05-04 DIAGNOSIS — N611 Abscess of the breast and nipple: Secondary | ICD-10-CM | POA: Diagnosis not present

## 2018-05-04 NOTE — Progress Notes (Signed)
Subjective:     Courtney Dyer  Here for follow-up wound check.  Patient has not gotten her antibiotic or blood pressure medication filled due to financial reasons.  She did get her ultrasound of the left breast which showed a retroareolar fluid collection with drainage to the skin.  There was no evidence of malignancy.  It was recommended to get a 39-month follow-up. Objective:    BP (!) 144/106 (BP Location: Left Arm, Patient Position: Sitting, Cuff Size: Normal)   Pulse (!) 103   Temp (!) 96.8 F (36 C) (Temporal)   Resp 18   Wt 204 lb (92.5 kg)   BMI 39.84 kg/m   General:  alert, cooperative and no distress  Left breast with induration below a granulomatous lesion along the previous incision line.  No active drainage is noted.  Silver nitrate was applied to the granuloma.     Assessment:    Left breast abscess.    Plan:   Patient states that she is seeing her primary care, Dr. Cindie Laroche tomorrow.  I stressed that she needed to start her antibiotics and her blood pressure medication.  She states she is picking that up today.  She knows that she needs a follow-up ultrasound of her left breast in 3 months.  I will follow her expectantly.  She has a lot of things going on in her family, thus she needs to limit her visits.  I understand this.

## 2019-09-17 ENCOUNTER — Other Ambulatory Visit: Payer: Self-pay

## 2019-09-17 ENCOUNTER — Emergency Department (HOSPITAL_COMMUNITY)
Admission: EM | Admit: 2019-09-17 | Discharge: 2019-09-17 | Disposition: A | Discharge status: Home / Self Care | Payer: Medicare Other | Attending: Emergency Medicine | Admitting: Emergency Medicine

## 2019-09-17 ENCOUNTER — Encounter (HOSPITAL_COMMUNITY): Payer: Self-pay | Admitting: Emergency Medicine

## 2019-09-17 ENCOUNTER — Emergency Department (HOSPITAL_COMMUNITY): Payer: Medicare Other

## 2019-09-17 DIAGNOSIS — Z79899 Other long term (current) drug therapy: Secondary | ICD-10-CM | POA: Insufficient documentation

## 2019-09-17 DIAGNOSIS — Z7984 Long term (current) use of oral hypoglycemic drugs: Secondary | ICD-10-CM | POA: Diagnosis not present

## 2019-09-17 DIAGNOSIS — M109 Gout, unspecified: Secondary | ICD-10-CM | POA: Diagnosis not present

## 2019-09-17 DIAGNOSIS — F17200 Nicotine dependence, unspecified, uncomplicated: Secondary | ICD-10-CM | POA: Insufficient documentation

## 2019-09-17 DIAGNOSIS — E119 Type 2 diabetes mellitus without complications: Secondary | ICD-10-CM | POA: Diagnosis not present

## 2019-09-17 MED ORDER — ONDANSETRON 4 MG PO TBDP
4.0000 mg | ORAL_TABLET | Freq: Once | ORAL | Status: AC
  Administered 2019-09-17: 4 mg via ORAL
  Filled 2019-09-17: qty 1

## 2019-09-17 MED ORDER — OXYCODONE-ACETAMINOPHEN 5-325 MG PO TABS: 1.0000 | ORAL_TABLET | Freq: Four times a day (QID) | ORAL | 0 refills | Status: DC | PRN

## 2019-09-17 MED ORDER — OXYCODONE-ACETAMINOPHEN 5-325 MG PO TABS
2.0000 | ORAL_TABLET | Freq: Once | ORAL | Status: AC
  Administered 2019-09-17: 14:00:00 2 via ORAL
  Filled 2019-09-17: qty 2

## 2019-09-17 MED ORDER — DEXAMETHASONE SODIUM PHOSPHATE 10 MG/ML IJ SOLN
10.0000 mg | Freq: Once | INTRAMUSCULAR | Status: AC
  Administered 2019-09-17: 10 mg via INTRAMUSCULAR
  Filled 2019-09-17: qty 1

## 2019-09-17 NOTE — Discharge Instructions (Addendum)
You have been given a medication called Decadron this medication will make your sugars run very high.  Please make sure that you are taking her sugars properly and limiting any foods that could make her sugars go up.  Follow closely with her primary care doctor. You were given narcotic and or sedative medications while in the emergency department. Do not drive. Do not use machinery or power tools. Do not sign legal documents. Do not drink alcohol. Do not take sleeping pills. Do not supervise children by yourself. Do not participate in activities that require climbing or being in high places.

## 2019-09-17 NOTE — ED Triage Notes (Signed)
Pt c/o of gout in right hand since yesterday

## 2019-09-17 NOTE — ED Provider Notes (Signed)
[facility] EMERGENCY DEPARTMENT Provider Note   CSN: [id number] Arrival date & time: 09/17/19  1128     History   Chief Complaint Chief Complaint  Patient presents with  . Gout    HPI Courtney Dyer is a 53 y.o. female  has a past medical history of Diabetes mellitus, Gout, and High cholesterol. S/p nephrectomy. Here with c/o R wrist pain. Onset yesterday. 10/10 worse with movement and palpation. She has a previous hx of gout.  feels the same as previous. No recent injuries or illnesses.     HPI  Past Medical History:  Diagnosis Date  . Diabetes mellitus   . Gout   . High cholesterol     Patient Active Problem List   Diagnosis Date Noted  . High cholesterol     Past Surgical History:  Procedure Laterality Date  . BREAST SURGERY    . NEPHRECTOMY       OB History   No obstetric history on file.      Home Medications    Prior to Admission medications   Medication Sig Start Date End Date Taking? Authorizing Provider  atorvastatin (LIPITOR) 20 MG tablet Take 20 mg by mouth daily.      [provider]  colesevelam (WELCHOL) 625 MG tablet Take 1,250 mg by mouth 2 (two) times daily. Only takes once daily    [provider]  HYDROcodone-acetaminophen (NORCO/VICODIN) 5-325 MG tablet 1 or 2 tabs PO q6 hours prn pain 11/26/15   Francine Graven, DO  lisinopril (PRINIVIL,ZESTRIL) 10 MG tablet Take 10 mg by mouth daily.    [provider]  metFORMIN (GLUCOPHAGE) 500 MG tablet Take 500 mg by mouth 2 (two) times daily.    [provider]  methocarbamol (ROBAXIN) 500 MG tablet Take 2 tablets (1,000 mg total) by mouth 4 (four) times daily as needed for muscle spasms (muscle spasm/pain). 11/26/15   Francine Graven, DO  sulfamethoxazole-trimethoprim (BACTRIM DS,SEPTRA DS) 800-160 MG tablet Take 1 tablet by mouth 2 (two) times daily. 02/28/18   Aviva Signs, MD    Family History No family history on file.  Social History Social  History   Tobacco Use  . Smoking status: Current Every Day Smoker    Packs/day: 0.50  . Smokeless tobacco: Never Used  Substance Use Topics  . Alcohol use: Yes    Comment: daily  . Drug use: No     Allergies   Patient has no known allergies.   Review of Systems Review of Systems  Ten systems reviewed and are negative for acute change, except as noted in the HPI.   Physical Exam Updated Vital Signs BP (!) 142/83 (BP Location: Right Arm)   Pulse (!) 107   Temp 99.1 F (37.3 C) (Oral)   Resp 18   Ht 5' (1.524 m)   Wt 92.5 kg   SpO2 95%   BMI 39.84 kg/m   Physical Exam Vitals signs and nursing note reviewed.  Constitutional:      General: She is not in acute distress.    Appearance: She is well-developed. She is not diaphoretic.  HENT:     Head: Normocephalic and atraumatic.  Eyes:     General: No scleral icterus.    Conjunctiva/sclera: Conjunctivae normal.  Neck:     Musculoskeletal: Normal range of motion.  Cardiovascular:     Rate and Rhythm: Normal rate and regular rhythm.     Heart sounds: Normal heart sounds. No murmur. No friction rub.  No gallop.   Pulmonary:     Effort: Pulmonary effort is normal. No respiratory distress.     Breath sounds: Normal breath sounds.  Abdominal:     General: Bowel sounds are normal. There is no distension.     Palpations: Abdomen is soft. There is no mass.     Tenderness: There is no abdominal tenderness. There is no guarding.  Musculoskeletal:     Comments: Right wrist with erythema and swelling especially over the dorsal surface.  Pain with any movement of the arm.  Pain is localized to the right wrist.  Normal capillary refill and radial pulse on the right, normal movement of the fingers, normal ipsilateral elbow and shoulder examination.  Skin:    General: Skin is warm and dry.  Neurological:     Mental Status: She is alert and oriented to person, place, and time.  Psychiatric:        Behavior: Behavior normal.       ED Treatments / Results  Labs (all labs ordered are listed, but only abnormal results are displayed) Labs Reviewed - No data to display  EKG None  Radiology Dg Wrist Complete Right  Result Date: 09/17/2019 CLINICAL DATA:  Pain and swelling.  History of gout. EXAM: RIGHT WRIST - COMPLETE 3+ VIEW COMPARISON:  None. FINDINGS: Frontal, oblique, lateral, and ulnar deviation scaphoid images obtained. Bones are osteoporotic. No fracture or dislocation. There are prominent subchondral cystic lesions in the distal radius as well as in the scaphoid, lunate, triquetrum, first metacarpal, and second metacarpal regions. There is mild narrowing in the scaphoid trapezial joint. There is juxta-articular erosion along the medial and mid aspects of the distal radius. There is an erosion along the lateral aspect of the lunate. There is soft tissue swelling. IMPRESSION: Bones osteoporotic. Multiple subchondral cystic changes. Juxta-articular erosion along the mid and medial distal radius. Erosion along the lateral aspect of the lunate. These changes are may be seen with gout. There may also be a degree of osteoarthritic change. No fracture or dislocation. Electronically Signed   By: Lowella Grip III M.D.   On: 09/17/2019 12:07    Procedures Procedures (including critical care time)  Medications Ordered in ED Medications - No data to display   Initial Impression / Assessment and Plan / ED Course  I have reviewed the triage vital signs and the nursing notes.  Pertinent labs & imaging results that were available during my care of the patient were reviewed by me and considered in my medical decision making (see chart for details).        Patient with a history of gout.  Right wrist is inflamed and tender.  We will give a single dose of Decadron.  She is warned that her sugars will become elevated but this should help take care of the gout attack.  She also has a history of nephrectomy so I will avoid  use of NSAIDs.  Patient given a prescription of Percocet after review of the PDMP system.  Discussed outpatient follow-up and return precautions.  She was appropriate for discharge at this time. Final Clinical Impressions(s) / ED Diagnoses   Final diagnoses:  Acute gout of right wrist, unspecified cause    ED Discharge Orders    None       Margarita Mail, PA-C 09/17/19 1252    Nat Christen, MD 09/21/19 [phone]

## 2020-03-27 ENCOUNTER — Ambulatory Visit: Payer: Medicare Other | Admitting: General Surgery

## 2020-06-16 ENCOUNTER — Telehealth: Payer: Self-pay | Admitting: Family Medicine

## 2020-06-16 NOTE — Telephone Encounter (Signed)
Pt called to make an apt with Dr. Arnoldo Morale as she states that her breast is draining again. I informed her that his first available will be 06/30/2020 as he is out of town this week and booked for the next. Informed her that she needed to be seen as she may need to be place on an antibx. Pt has moved to [place] and has not yet established care with a PCP. Give her information for urgent care and strongly recommended that she go see them. She agreed and verbalized understanding.

## 2020-10-16 ENCOUNTER — Other Ambulatory Visit: Payer: Self-pay

## 2020-10-16 ENCOUNTER — Ambulatory Visit (INDEPENDENT_AMBULATORY_CARE_PROVIDER_SITE_OTHER): Payer: Medicare Other | Admitting: General Surgery

## 2020-10-16 ENCOUNTER — Encounter: Payer: Self-pay | Admitting: General Surgery

## 2020-10-16 VITALS — BP 156/106 | HR 89 | Temp 97.7°F | Resp 16 | Ht 60.0 in | Wt 236.0 lb

## 2020-10-16 DIAGNOSIS — N6122 Granulomatous mastitis, left breast: Secondary | ICD-10-CM

## 2020-10-16 MED ORDER — SULFAMETHOXAZOLE-TRIMETHOPRIM 800-160 MG PO TABS: 1.0000 | ORAL_TABLET | Freq: Two times a day (BID) | ORAL | 1 refills | Status: DC

## 2020-10-16 NOTE — Progress Notes (Signed)
Subjective:     Courtney Dyer  Patient is a 54 year old black female who presents back to my care for a chronic area of inflammation in the left periareolar region.  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  I last saw her in my office in June 2019.  She states she has not had a follow-up mammogram or ultrasound since that time, despite being told she needed to get that done.  She has various social economic issues which prevent her from getting either her medications or adequate follow-up.  She states she has had intermittent bloody drainage from a wound along the periareolar region of the left breast for the past few months.  She denies any fever or chills. Objective:    BP (!) 156/106   Pulse 89   Temp 97.7 F (36.5 C) (Oral)   Resp 16   Ht 5' (1.524 m)   Wt 236 lb (107 kg)   SpO2 95%   BMI 46.09 kg/m   General:  alert, cooperative and no distress  Pleasant black female no acute distress Lungs clear to auscultation with good breath sounds bilaterally Heart examination reveals regular rate and rhythm without S3, S4, murmurs Breast examination reveals an area of skin excoriation along the lateral aspect of the areolar border with small scabs present along a surgical scar.  No active drainage is noted.  Some induration is present deep to this.  No erythema is present.  I cannot express any purulent drainage.  No nipple retraction is noted.  No other dominant mass is noted in the left breast.  Axilla is negative for palpable nodes.  Right breast examination reveals a surgical scar present in the periareolar region.  No dominant mass, nipple discharge, or dimpling is noted.  The axilla is negative for palpable nodes.  Previous office notes reviewed     Assessment:    Left granulomatous breast tissue, chronic in nature.    Plan:   Given that she has had a flareup of the inflammation, will start Bactrim DS 1 tablet twice a day for 10 days.   I did give her a refill.  I strongly encouraged her to get a follow-up mammogram and possible ultrasound of both breasts as she has not had that since 2019.  She understands and agrees.  We will see her back in 1-2 months for follow-up.

## 2020-12-24 DIAGNOSIS — H6092 Unspecified otitis externa, left ear: Secondary | ICD-10-CM | POA: Insufficient documentation

## 2020-12-24 DIAGNOSIS — H6042 Cholesteatoma of left external ear: Secondary | ICD-10-CM | POA: Insufficient documentation

## 2020-12-24 DIAGNOSIS — H6123 Impacted cerumen, bilateral: Secondary | ICD-10-CM | POA: Insufficient documentation

## 2021-01-20 ENCOUNTER — Other Ambulatory Visit: Payer: Self-pay | Admitting: Physician Assistant

## 2021-01-20 DIAGNOSIS — H6042 Cholesteatoma of left external ear: Secondary | ICD-10-CM

## 2021-02-13 ENCOUNTER — Ambulatory Visit
Admission: RE | Admit: 2021-02-13 | Discharge: 2021-02-13 | Disposition: A | Discharge status: Home / Self Care | Payer: Medicare Other | Source: Ambulatory Visit | Attending: Physician Assistant | Admitting: Physician Assistant

## 2021-02-13 DIAGNOSIS — H6042 Cholesteatoma of left external ear: Secondary | ICD-10-CM

## 2021-04-23 ENCOUNTER — Other Ambulatory Visit: Payer: Self-pay

## 2021-04-23 ENCOUNTER — Emergency Department (HOSPITAL_COMMUNITY): Payer: Medicare Other

## 2021-04-23 ENCOUNTER — Inpatient Hospital Stay (HOSPITAL_COMMUNITY)
Admission: EM | Admit: 2021-04-23 | Discharge: 2021-04-27 | DRG: 683 | Disposition: A | Discharge status: Home / Self Care | Payer: Medicare Other | Attending: Internal Medicine | Admitting: Internal Medicine

## 2021-04-23 ENCOUNTER — Encounter (HOSPITAL_COMMUNITY): Payer: Self-pay | Admitting: Emergency Medicine

## 2021-04-23 DIAGNOSIS — M109 Gout, unspecified: Secondary | ICD-10-CM | POA: Diagnosis present

## 2021-04-23 DIAGNOSIS — N179 Acute kidney failure, unspecified: Principal | ICD-10-CM

## 2021-04-23 DIAGNOSIS — Z79899 Other long term (current) drug therapy: Secondary | ICD-10-CM

## 2021-04-23 DIAGNOSIS — R5381 Other malaise: Secondary | ICD-10-CM | POA: Diagnosis present

## 2021-04-23 DIAGNOSIS — K529 Noninfective gastroenteritis and colitis, unspecified: Secondary | ICD-10-CM | POA: Diagnosis present

## 2021-04-23 DIAGNOSIS — D649 Anemia, unspecified: Secondary | ICD-10-CM | POA: Diagnosis present

## 2021-04-23 DIAGNOSIS — Z9114 Patient's other noncompliance with medication regimen: Secondary | ICD-10-CM

## 2021-04-23 DIAGNOSIS — R5383 Other fatigue: Secondary | ICD-10-CM | POA: Diagnosis present

## 2021-04-23 DIAGNOSIS — E860 Dehydration: Secondary | ICD-10-CM | POA: Diagnosis present

## 2021-04-23 DIAGNOSIS — Z8249 Family history of ischemic heart disease and other diseases of the circulatory system: Secondary | ICD-10-CM

## 2021-04-23 DIAGNOSIS — F17210 Nicotine dependence, cigarettes, uncomplicated: Secondary | ICD-10-CM | POA: Diagnosis present

## 2021-04-23 DIAGNOSIS — Z905 Acquired absence of kidney: Secondary | ICD-10-CM

## 2021-04-23 DIAGNOSIS — Z20822 Contact with and (suspected) exposure to covid-19: Secondary | ICD-10-CM | POA: Diagnosis present

## 2021-04-23 DIAGNOSIS — E1165 Type 2 diabetes mellitus with hyperglycemia: Secondary | ICD-10-CM | POA: Diagnosis present

## 2021-04-23 DIAGNOSIS — E7800 Pure hypercholesterolemia, unspecified: Secondary | ICD-10-CM | POA: Diagnosis present

## 2021-04-23 DIAGNOSIS — R42 Dizziness and giddiness: Secondary | ICD-10-CM | POA: Diagnosis present

## 2021-04-23 DIAGNOSIS — N390 Urinary tract infection, site not specified: Secondary | ICD-10-CM | POA: Diagnosis present

## 2021-04-23 DIAGNOSIS — E876 Hypokalemia: Secondary | ICD-10-CM | POA: Diagnosis present

## 2021-04-23 DIAGNOSIS — Z6841 Body Mass Index (BMI) 40.0 and over, adult: Secondary | ICD-10-CM

## 2021-04-23 DIAGNOSIS — R1030 Lower abdominal pain, unspecified: Secondary | ICD-10-CM

## 2021-04-23 LAB — COMPREHENSIVE METABOLIC PANEL
ALT: 6 U/L (ref 0–44)
AST: 13 U/L — ABNORMAL LOW (ref 15–41)
Albumin: 3 g/dL — ABNORMAL LOW (ref 3.5–5.0)
Alkaline Phosphatase: 82 U/L (ref 38–126)
Anion gap: 12 (ref 5–15)
BUN: 29 mg/dL — ABNORMAL HIGH (ref 6–20)
CO2: 23 mmol/L (ref 22–32)
Calcium: 8.7 mg/dL — ABNORMAL LOW (ref 8.9–10.3)
Chloride: 98 mmol/L (ref 98–111)
Creatinine, Ser: 2.42 mg/dL — ABNORMAL HIGH (ref 0.44–1.00)
GFR, Estimated: 23 mL/min — ABNORMAL LOW (ref >60)
Glucose, Bld: 184 mg/dL — ABNORMAL HIGH (ref 70–99)
Potassium: 2.9 mmol/L — ABNORMAL LOW (ref 3.5–5.1)
Sodium: 133 mmol/L — ABNORMAL LOW (ref 135–145)
Total Bilirubin: 0.6 mg/dL (ref 0.3–1.2)
Total Protein: 8.5 g/dL — ABNORMAL HIGH (ref 6.5–8.1)

## 2021-04-23 LAB — CBC
HCT: 32.9 % — ABNORMAL LOW (ref 36.0–46.0)
Hemoglobin: 10.5 g/dL — ABNORMAL LOW (ref 12.0–15.0)
MCH: 28.3 pg (ref 26.0–34.0)
MCHC: 31.9 g/dL (ref 30.0–36.0)
MCV: 88.7 fL (ref 80.0–100.0)
Platelets: 419 10*3/uL — ABNORMAL HIGH (ref 150–400)
RBC: 3.71 MIL/uL — ABNORMAL LOW (ref 3.87–5.11)
RDW: 13.7 % (ref 11.5–15.5)
WBC: 17.1 10*3/uL — ABNORMAL HIGH (ref 4.0–10.5)
nRBC: 0 % (ref 0.0–0.2)

## 2021-04-23 LAB — LIPASE, BLOOD: Lipase: 25 U/L (ref 11–51)

## 2021-04-23 LAB — MAGNESIUM: Magnesium: 1 mg/dL — ABNORMAL LOW (ref 1.7–2.4)

## 2021-04-23 MED ORDER — ACETAMINOPHEN 650 MG RE SUPP: 650.0000 mg | Freq: Four times a day (QID) | RECTAL | Status: DC | PRN

## 2021-04-23 MED ORDER — POTASSIUM CHLORIDE 20 MEQ PO PACK
40.0000 meq | PACK | Freq: Once | ORAL | Status: AC
  Administered 2021-04-23: 40 meq via ORAL
  Filled 2021-04-23: qty 2

## 2021-04-23 MED ORDER — POTASSIUM CHLORIDE 10 MEQ/100ML IV SOLN
10.0000 meq | Freq: Once | INTRAVENOUS | Status: AC
  Administered 2021-04-23: 10 meq via INTRAVENOUS
  Filled 2021-04-23: qty 100

## 2021-04-23 MED ORDER — ALUM & MAG HYDROXIDE-SIMETH 200-200-20 MG/5ML PO SUSP
30.0000 mL | Freq: Once | ORAL | Status: AC
  Administered 2021-04-23: 30 mL via ORAL
  Filled 2021-04-23: qty 30

## 2021-04-23 MED ORDER — ONDANSETRON HCL 4 MG/2ML IJ SOLN
4.0000 mg | Freq: Once | INTRAMUSCULAR | Status: AC
  Administered 2021-04-23: 4 mg via INTRAVENOUS
  Filled 2021-04-23: qty 2

## 2021-04-23 MED ORDER — MAGNESIUM SULFATE 2 GM/50ML IV SOLN
2.0000 g | Freq: Once | INTRAVENOUS | Status: AC
  Administered 2021-04-23: 2 g via INTRAVENOUS
  Filled 2021-04-23: qty 50

## 2021-04-23 MED ORDER — ENOXAPARIN SODIUM 40 MG/0.4ML IJ SOSY
40.0000 mg | PREFILLED_SYRINGE | INTRAMUSCULAR | Status: DC
Start: 2021-04-23 — End: 2021-04-27
  Administered 2021-04-24 – 2021-04-27 (×4): 40 mg via SUBCUTANEOUS
  Filled 2021-04-23 (×4): qty 0.4

## 2021-04-23 MED ORDER — ACETAMINOPHEN 325 MG PO TABS: 650.0000 mg | ORAL_TABLET | Freq: Four times a day (QID) | ORAL | Status: DC | PRN

## 2021-04-23 MED ORDER — LIDOCAINE VISCOUS HCL 2 % MT SOLN
15.0000 mL | Freq: Once | OROMUCOSAL | Status: AC
  Administered 2021-04-23: 15 mL via ORAL
  Filled 2021-04-23: qty 15

## 2021-04-23 MED ORDER — ONDANSETRON HCL 4 MG PO TABS: 4.0000 mg | ORAL_TABLET | Freq: Four times a day (QID) | ORAL | Status: DC | PRN

## 2021-04-23 MED ORDER — ONDANSETRON HCL 4 MG/2ML IJ SOLN: 4.0000 mg | Freq: Four times a day (QID) | INTRAMUSCULAR | Status: DC | PRN

## 2021-04-23 MED ORDER — SODIUM CHLORIDE 0.9 % IV BOLUS
1000.0000 mL | Freq: Once | INTRAVENOUS | Status: AC
  Administered 2021-04-23: 1000 mL via INTRAVENOUS

## 2021-04-23 MED ORDER — DICYCLOMINE HCL 10 MG/ML IM SOLN
20.0000 mg | Freq: Once | INTRAMUSCULAR | Status: AC
  Administered 2021-04-23: 20 mg via INTRAMUSCULAR
  Filled 2021-04-23: qty 2

## 2021-04-23 MED ORDER — ENOXAPARIN SODIUM 30 MG/0.3ML IJ SOSY: 30.0000 mg | PREFILLED_SYRINGE | INTRAMUSCULAR | Status: DC

## 2021-04-23 NOTE — ED Provider Notes (Signed)
[facility] EMERGENCY DEPARTMENT Provider Note   CSN: [id number] Arrival date & time: 04/23/21  1919     History Chief Complaint  Patient presents with   Abdominal Pain    Courtney Dyer is a 55 y.o. female.  HPI  Patient with significant medical history of diabetes, gout, hyperlipidemia presents with chief complaint of abdominal pain.  Patient states this started 1 week ago, states she has  pain in her lower abdomen, does not radiate, she has been unable to tolerate p.o., states every time she eats or drinks she vomits it back out, she denies hematemesis, Coffee-ground emesis, she denies constipation, diarrhea, she denies urinary symptoms.  Patient states she is never had this in the past, she has no  abdominal history, she does endorse that she takes ibuprofen daily for knee pain but she denies smoking, alcohol use, denies history of stomach ulcers, diverticulitis, pancreatitis, denies surgical history, no history of connective tissue disorders or aneurysms.  Patient denies any recent sick contacts, she denies nasal congestion, sore throat or cough, she is up-to-date on her COVID-vaccine, she denies any alleviating factors.  Patient denies headaches, fevers, chest pain, shortness of breath.  Past Medical History:  Diagnosis Date   Diabetes mellitus    Gout    High cholesterol     Patient Active Problem List   Diagnosis Date Noted   AKI (acute kidney injury) ([place]) 04/23/2021   Hypokalemia 04/23/2021   Normocytic anemia 04/23/2021   Hypomagnesemia 04/23/2021   High cholesterol     Past Surgical History:  Procedure Laterality Date   BREAST SURGERY     NEPHRECTOMY       OB History   No obstetric history on file.     History reviewed. No pertinent family history.  Social History   Tobacco Use   Smoking status: Every Day    Packs/day: 0.50    Pack years: 0.00    Types: Cigarettes   Smokeless tobacco: Never  Substance Use Topics   Alcohol use: Yes    Comment:  daily   Drug use: No    Home Medications Prior to Admission medications   Medication Sig Start Date End Date Taking? Authorizing Provider  sulfamethoxazole-trimethoprim (BACTRIM DS) 800-160 MG tablet Take 1 tablet by mouth 2 (two) times daily. 10/16/20   Aviva Signs, MD    Allergies    Patient has no known allergies.  Review of Systems   Review of Systems  Constitutional:  Negative for chills and fever.  HENT:  Negative for congestion.   Respiratory:  Negative for shortness of breath.   Cardiovascular:  Negative for chest pain.  Gastrointestinal:  Positive for abdominal pain, nausea and vomiting. Negative for constipation and diarrhea.  Genitourinary:  Negative for enuresis.  Musculoskeletal:  Negative for back pain.  Skin:  Negative for rash.  Neurological:  Negative for headaches.  Hematological:  Does not bruise/bleed easily.   Physical Exam Updated Vital Signs BP 114/73   Pulse 99   Temp 99.2 F (37.3 C) (Oral)   Resp (!) 21   Ht 5' (1.524 m)   Wt 107 kg   LMP 09/26/2015   SpO2 96%   BMI 46.07 kg/m   Physical Exam Vitals and nursing note reviewed.  Constitutional:      General: She is not in acute distress.    Appearance: She is not ill-appearing.  HENT:     Head: Normocephalic and atraumatic.     Nose: No congestion.  Eyes:  Conjunctiva/sclera: Conjunctivae normal.  Cardiovascular:     Rate and Rhythm: Regular rhythm. Tachycardia present.     Pulses: Normal pulses.     Heart sounds: No murmur heard.   No friction rub. No gallop.  Pulmonary:     Effort: No respiratory distress.     Breath sounds: No wheezing, rhonchi or rales.  Abdominal:     Palpations: Abdomen is soft.     Tenderness: There is abdominal tenderness. There is no right CVA tenderness or left CVA tenderness.     Comments: Abdomen was visualized is nondistended, normoactive bowel sounds, dull to percussion, she is notably tender in her lower abdomen worse beneath her umbilicus, there  is no rebound tenderness, guarding, peritoneal sign, negative McBurney point or Murphy sign.  She had no CVA tenderness.  Musculoskeletal:     Right lower leg: No edema.     Left lower leg: No edema.  Skin:    General: Skin is warm and dry.  Neurological:     Mental Status: She is alert.  Psychiatric:        Mood and Affect: Mood normal.    ED Results / Procedures / Treatments   Labs (all labs ordered are listed, but only abnormal results are displayed) Labs Reviewed  COMPREHENSIVE METABOLIC PANEL - Abnormal; Notable for the following components:      Result Value   Sodium 133 (*)    Potassium 2.9 (*)    Glucose, Bld 184 (*)    BUN 29 (*)    Creatinine, Ser 2.42 (*)    Calcium 8.7 (*)    Total Protein 8.5 (*)    Albumin 3.0 (*)    AST 13 (*)    GFR, Estimated 23 (*)    All other components within normal limits  CBC - Abnormal; Notable for the following components:   WBC 17.1 (*)    RBC 3.71 (*)    Hemoglobin 10.5 (*)    HCT 32.9 (*)    Platelets 419 (*)    All other components within normal limits  MAGNESIUM - Abnormal; Notable for the following components:   Magnesium 1.0 (*)    All other components within normal limits  RESP PANEL BY RT-PCR (FLU A&B, COVID) ARPGX2  LIPASE, BLOOD  URINALYSIS, ROUTINE W REFLEX MICROSCOPIC  HIV ANTIBODY (ROUTINE TESTING W REFLEX)  CBC WITH DIFFERENTIAL/PLATELET  COMPREHENSIVE METABOLIC PANEL    EKG EKG Interpretation  Date/Time:  Thursday April 23 2021 19:28:08 EDT Ventricular Rate:  119 PR Interval:  128 QRS Duration: 89 QT Interval:  466 QTC Calculation: 656 R Axis:   -3 Text Interpretation: Sinus tachycardia Consider right atrial enlargement LVH by voltage Abnormal T, consider ischemia, lateral leads Prolonged QT interval Confirmed by Noemi Chapel [phone]) on 04/23/2021 9:18:21 PM  Radiology CT ABDOMEN PELVIS WO CONTRAST  Result Date: 04/23/2021 CLINICAL DATA:  Abdominal pain for 3 weeks. EXAM: CT ABDOMEN AND PELVIS WITHOUT  CONTRAST TECHNIQUE: Multidetector CT imaging of the abdomen and pelvis was performed following the standard protocol without IV contrast. COMPARISON:  None. FINDINGS: Lower chest: Lung bases are clear. Hepatobiliary: No focal hepatic lesion. No biliary duct dilatation. Common bile duct is normal. Pancreas: Pancreas is normal. No ductal dilatation. No pancreatic inflammation. Spleen: Normal spleen Adrenals/urinary tract: Low-attenuation enlargement of the LEFT adrenal gland to 1.8 by 2.3 cm. Lesion does not meet criteria for benign adenoma on this noncontrast exam. The RIGHT kidney is atrophic. LEFT kidney appears normal. Stomach/Bowel: Stomach, duodenum small-bowel  normal. Cecum is normal. Cecum is in the RIGHT upper quadrant beneath the RIGHT hepatic lobe. Appendix is not identified however there are no secondary signs of appendicitis. The transverse colon and descending colon are normal. Rectosigmoid colon normal. Vascular/Lymphatic: Abdominal aorta is normal caliber. No periportal or retroperitoneal adenopathy. No pelvic adenopathy. Reproductive: Uterus and adnexa unremarkable. Other: No free fluid.  Small fat filled RIGHT umbilical hernia. Musculoskeletal: No aggressive osseous lesion. Degenerative osteophytosis of the spine. IMPRESSION: 1. While the appendix not identified, no secondary signs of appendicitis. The cecum is tucked in the RIGHT upper quadrant. 2. No acute findings in the abdomen pelvis. 3. Low-density enlargement of the LEFT adrenal gland is favored benign however cannot be fully characterized on current exam. 4. RIGHT kidney is atrophic. Electronically Signed   By: Suzy Bouchard M.D.   On: 04/23/2021 21:57    Procedures .Critical Care  Date/Time: 04/23/2021 11:29 PM Performed by: Marcello Fennel, PA-C Authorized by: Marcello Fennel, PA-C   Critical care provider statement:    Critical care time (minutes):  45   Critical care time was exclusive of:  Separately billable  procedures and treating other patients   Critical care was necessary to treat or prevent imminent or life-threatening deterioration of the following conditions:  Metabolic crisis   Critical care was time spent personally by me on the following activities:  Discussions with consultants, evaluation of patient's response to treatment, examination of patient, ordering and performing treatments and interventions, ordering and review of laboratory studies, ordering and review of radiographic studies, pulse oximetry, re-evaluation of patient's condition, obtaining history from patient or surrogate and review of old charts   I assumed direction of critical care for this patient from another provider in my specialty: no     Care discussed with: admitting provider     Medications Ordered in ED Medications  magnesium sulfate IVPB 2 g 50 mL (2 g Intravenous New Bag/Given 04/23/21 2258)  acetaminophen (TYLENOL) tablet 650 mg (has no administration in time range)    Or  acetaminophen (TYLENOL) suppository 650 mg (has no administration in time range)  ondansetron (ZOFRAN) tablet 4 mg (has no administration in time range)    Or  ondansetron (ZOFRAN) injection 4 mg (has no administration in time range)  enoxaparin (LOVENOX) injection 40 mg (has no administration in time range)  sodium chloride 0.9 % bolus 1,000 mL (0 mLs Intravenous Stopped 04/23/21 2253)  ondansetron (ZOFRAN) injection 4 mg (4 mg Intravenous Given 04/23/21 2149)  alum & mag hydroxide-simeth (MAALOX/MYLANTA) 200-200-20 MG/5ML suspension 30 mL (30 mLs Oral Given 04/23/21 2142)    And  lidocaine (XYLOCAINE) 2 % viscous mouth solution 15 mL (15 mLs Oral Given 04/23/21 2142)  dicyclomine (BENTYL) injection 20 mg (20 mg Intramuscular Given 04/23/21 2153)  potassium chloride 10 mEq in 100 mL IVPB (0 mEq Intravenous Stopped 04/23/21 2252)  potassium chloride (KLOR-CON) packet 40 mEq (40 mEq Oral Given 04/23/21 2142)    ED Course  I have reviewed the triage vital  signs and the nursing notes.  Pertinent labs & imaging results that were available during my care of the patient were reviewed by me and considered in my medical decision making (see chart for details).    MDM Rules/Calculators/A&P                         Initial impression-patient presents with abdominal pain.  She is alert, does not appear in acute distress, vital  signs significant for tachycardia.  Concern for abdominal infection, diverticulitis, appendicitis.  Will obtain basic lab work-up, imaging, provide patient fluids, antiemetics and reassess.  Work-up-CBC shows leukocytosis 17.1, normocytic anemia with hemoglobin 10.5 appears to be at baseline.  CMP shows hyponatremia of 133, hypokalemia of 2.9, elevated glucose of 184, elevated creatinine of 2.42, baseline is 1.4, lipase 25, mag 1.  CT and pelvis shows no signs of appendicitis, no acute findings in the abdomen pelvis, low-density enlargement of the left adrenal gland favors benign however would recommend further follow-up for evaluation.  EKG sinus tach without signs of ischemia shows prolonged QT.  Reassessment- noted that the patient has hypokalemia as well as hypomagnesia will provide patient with IV potassium and magnesium continue to monitor.  Patient was reassessed has no complaints this time, vital signs have remained stable.  Recommend hospital admission due to severe electrolyte derailment most like secondary to viral gastroenteritis patient is agreeable to this.  Will admit to medicine.  Consult spoke with Dr. Olevia Bowens he will admit the patient.  Rule out-I have low suspicion for appendicitis as presentation is atypical patient is having abdominal pain for a week's time intermittently, she is afebrile, there is no peritoneal sign or McBurney point present my exam.  Patient does have noted leukocytosis and was tachycardic on my exam but suspect this secondary due to acute phase reactant and dehydration as this improved after she  received fluids.  I have low suspicion for diverticulitis as no diverticulosis present on CT scan.  Low suspicion for liver or gallbladder abnormality as an elevation liver or alk phos.  Low suspicion for pancreatitis as lipase within normal limits.  Low suspicion for sepsis as patient is nontoxic-appearing, vital signs have improved after fluids, she is afebrile.  Noted that the patient has an AKI I doubt this secondary due to UTI or pyelonephritis as patient denies any urinary symptoms, no CVA tenderness on my exam, CT imaging is negative for acute findings.  I suspect AKI secondary due to dehydration from nausea and vomiting.  Plan-admit to medicine due to severe electrolyte derailment most likely secondary to a viral gastroenteritis. Final Clinical Impression(s) / ED Diagnoses Final diagnoses:  Lower abdominal pain  Hypokalemia  Hypomagnesemia    Rx / DC Orders ED Discharge Orders     None        Marcello Fennel, PA-C 04/23/21 2331    Noemi Chapel, MD 04/25/21 1505
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
## 2021-04-23 NOTE — H&P (Signed)
History and Physical    Courtney Dyer [id number] DOB: 04-28-1966 DOA: 04/23/2021  PCP: Lucia Gaskins, MD   Patient coming from: Home.  I have personally briefly reviewed patient's old medical records in [place]  Chief Complaint: Abdominal pain.  HPI: Courtney Dyer is a 55 y.o. female with medical history significant of type 2 diabetes, gout, hyperlipidemia, class III obesity who is coming to the emergency department with a 1 week history of abdominal pain associated with multiple episodes of emesis, diarrhea, decreased appetite, mild lightheadedness, fatigue and malaise.  She denies coffee-ground or hematemesis, melena, hematochezia or mucus in stool.  No dysuria, flank pain, frequency or hematuria.  No sick contacts at home and no travel history.  She denies headache, rhinorrhea, sore throat, dyspnea, wheezing or hemoptysis.  No chest pain, palpitations, diaphoresis, PND, orthopnea, but stated she occasionally gets lower extremity edema.  She denies polyuria, polydipsia, polyphagia or blurred vision.  Denies pruritus or rashes.  ED Course: Initial vital signs were temperature 99.2 F, pulse 116, respiration 20, BP 111/68 mmHg O2 sat 95% on room air.  The patient t received 1000 mL NS bolus, KCl 40 mEq p.o. x1, KCl 10 mEq IVPB x1, Maalox with lidocaine, dicyclomine 920 mg IM, casein sulfate 2 g IVPB and ondansetron.  Lab work: CBC showed a white count of 17.1, hemoglobin 10.5 g/dL and platelets 419.  Her sodium was 133, potassium 2.9 mmol/L.  The rest of the electrolytes were normal when calcium was corrected to albumin.  Glucose 184, BUN 29, creatinine 2.42, magnesium 1.0 and calcium 8.7 mg/dL.  Total protein 7.5 and albumin 3.0 g/dL.  The rest of the LFTs are unremarkable.  Lipase was normal.  Imaging: CT abdomen/pelvis without contrast did not visualize the appendix, but there were no appendicitis signs.  Her cecum was tucked in the RUQ.  There were no acute findings.   Please see images and phonated report for further detail.  Review of Systems: As per HPI otherwise all other systems reviewed and are negative.  Past Medical History:  Diagnosis Date   Diabetes mellitus    Gout    High cholesterol     Past Surgical History:  Procedure Laterality Date   BREAST SURGERY     NEPHRECTOMY      Social History  reports that she has been smoking. She has been smoking an average of 0.50 packs per day. She has never used smokeless tobacco. She reports current alcohol use. She reports that she does not use drugs.  No Known Allergies  Family medical history A maternal aunt has hypertension.  Prior to Admission medications   Medication Sig Start Date End Date Taking? Authorizing Provider  sulfamethoxazole-trimethoprim (BACTRIM DS) 800-160 MG tablet Take 1 tablet by mouth 2 (two) times daily. 10/16/20   Aviva Signs, MD    Physical Exam: Vitals:   04/23/21 1933 04/23/21 2030 04/23/21 2130 04/23/21 2230  BP: 111/60 117/68 (!) 130/93 114/73  Pulse: (!) 116 (!) 107 (!) 106 99  Resp: 20 (!) 26 20 (!) 21  Temp: 99.2 F (37.3 C)     TempSrc: Oral     SpO2: 95% 97% 98% 96%  Weight:      Height:        Constitutional: NAD, calm, comfortable Eyes: PERRL, lids and conjunctivae normal ENMT: Mucous membranes are dry.  Posterior pharynx clear of any exudate or lesions. Neck: normal, supple, no masses, no thyromegaly Respiratory: clear to auscultation bilaterally, no wheezing,  no crackles. Normal respiratory effort. No accessory muscle use.  Cardiovascular: Regular rate and rhythm, no murmurs / rubs / gallops. No extremity edema. 2+ pedal pulses. No carotid bruits.  Abdomen: no tenderness, no masses palpated. No hepatosplenomegaly. Bowel sounds positive.  Musculoskeletal: no clubbing / cyanosis. Good ROM, no contractures. Normal muscle tone.  Skin: no rashes, lesions, ulcers. No induration Neurologic: CN 2-12 grossly intact. Sensation intact, DTR normal.  Strength 5/5 in all 4.  Psychiatric: Normal judgment and insight. Alert and oriented x 3. Normal mood.    Labs on Admission: I have personally reviewed following labs and imaging studies  CBC: Recent Labs  Lab 04/23/21 2016  WBC 17.1*  HGB 10.5*  HCT 32.9*  MCV 88.7  PLT 419*    Basic Metabolic Panel: Recent Labs  Lab 04/23/21 2016  NA 133*  K 2.9*  CL 98  CO2 23  GLUCOSE 184*  BUN 29*  CREATININE 2.42*  CALCIUM 8.7*  MG 1.0*    GFR: Estimated Creatinine Clearance: 29.8 mL/min (A) (by C-G formula based on SCr of 2.42 mg/dL (H)).  Liver Function Tests: Recent Labs  Lab 04/23/21 2016  AST 13*  ALT 6  ALKPHOS 82  BILITOT 0.6  PROT 8.5*  ALBUMIN 3.0*    Urine analysis: No results found for: COLORURINE, APPEARANCEUR, LABSPEC, PHURINE, GLUCOSEU, HGBUR, BILIRUBINUR, KETONESUR, PROTEINUR, UROBILINOGEN, NITRITE, LEUKOCYTESUR  Radiological Exams on Admission: CT ABDOMEN PELVIS WO CONTRAST  Result Date: 04/23/2021 CLINICAL DATA:  Abdominal pain for 3 weeks. EXAM: CT ABDOMEN AND PELVIS WITHOUT CONTRAST TECHNIQUE: Multidetector CT imaging of the abdomen and pelvis was performed following the standard protocol without IV contrast. COMPARISON:  None. FINDINGS: Lower chest: Lung bases are clear. Hepatobiliary: No focal hepatic lesion. No biliary duct dilatation. Common bile duct is normal. Pancreas: Pancreas is normal. No ductal dilatation. No pancreatic inflammation. Spleen: Normal spleen Adrenals/urinary tract: Low-attenuation enlargement of the LEFT adrenal gland to 1.8 by 2.3 cm. Lesion does not meet criteria for benign adenoma on this noncontrast exam. The RIGHT kidney is atrophic. LEFT kidney appears normal. Stomach/Bowel: Stomach, duodenum small-bowel normal. Cecum is normal. Cecum is in the RIGHT upper quadrant beneath the RIGHT hepatic lobe. Appendix is not identified however there are no secondary signs of appendicitis. The transverse colon and descending colon are  normal. Rectosigmoid colon normal. Vascular/Lymphatic: Abdominal aorta is normal caliber. No periportal or retroperitoneal adenopathy. No pelvic adenopathy. Reproductive: Uterus and adnexa unremarkable. Other: No free fluid.  Small fat filled RIGHT umbilical hernia. Musculoskeletal: No aggressive osseous lesion. Degenerative osteophytosis of the spine. IMPRESSION: 1. While the appendix not identified, no secondary signs of appendicitis. The cecum is tucked in the RIGHT upper quadrant. 2. No acute findings in the abdomen pelvis. 3. Low-density enlargement of the LEFT adrenal gland is favored benign however cannot be fully characterized on current exam. 4. RIGHT kidney is atrophic. Electronically Signed   By: Suzy Bouchard M.D.   On: 04/23/2021 21:57    EKG: Independently reviewed.  Vent. rate 119 BPM PR interval 128 ms QRS duration 89 ms QT/QTcB 466/656 ms P-R-T axes 53 -3 82 Sinus tachycardia Consider right atrial enlargement LVH by voltage Abnormal T, consider ischemia, lateral leads Prolonged QT interval  Assessment/Plan Principal Problem:   AKI (acute kidney injury) ([place]) In the setting of acute gastroenteritis. Place in circulation/telemetry. Continue IV fluids. Full liquid diet. Analgesics as needed. Protonix 40 mg IVP every 24 hours.  Active Problems:   High cholesterol Off medications. Recommended her to  follow-up with PCP.    Hypokalemia Continue potassium supplementation. Follow-up potassium level.    Normocytic anemia Monitor H&H.    Hypomagnesemia Replacement given. Follow-up level as needed.    DVT prophylaxis: Lovenox SQ. Code Status:   Full code. Family Communication:   Disposition Plan:   Patient is from:  Home.  Anticipated DC to:  Home.  Anticipated DC date:  04/25/2021.  Anticipated DC barriers: Clinical status.  Consults called:   Admission status:  Observation/telemetry.   Severity of Illness: High severity in the setting of AKI with  multiple electrolytes abnormalities.  The patient will need to stay for 24 to 48 hours for IV hydration and electrolyte replacement.  Reubin Milan MD [facility]  How to contact the [facility] Attending or Consulting provider [place] or covering provider during after hours [place], for this patient?   Check the care team in [facility] and look for a) attending/consulting [facility] provider listed and b) the [facility] team listed Log into [email or web address] and use [place]'s universal password to access. If you do not have the password, please contact the hospital operator. Locate the [facility] provider you are looking for under [facility] and page to a number that you can be directly reached. If you still have difficulty reaching the provider, please page the [facility] (Director on Call) for the Hospitalists listed on amion for assistance.  04/23/2021, 11:24 PM   This document was prepared using Dragon voice recognition software and may contain some unintended transcription errors.

## 2021-04-23 NOTE — ED Notes (Signed)
PA at bedside.

## 2021-04-24 ENCOUNTER — Encounter (HOSPITAL_COMMUNITY): Payer: Self-pay | Admitting: Internal Medicine

## 2021-04-24 DIAGNOSIS — D649 Anemia, unspecified: Secondary | ICD-10-CM | POA: Diagnosis present

## 2021-04-24 DIAGNOSIS — R5381 Other malaise: Secondary | ICD-10-CM | POA: Diagnosis present

## 2021-04-24 DIAGNOSIS — E7800 Pure hypercholesterolemia, unspecified: Secondary | ICD-10-CM

## 2021-04-24 DIAGNOSIS — E876 Hypokalemia: Secondary | ICD-10-CM

## 2021-04-24 DIAGNOSIS — Z905 Acquired absence of kidney: Secondary | ICD-10-CM | POA: Diagnosis not present

## 2021-04-24 DIAGNOSIS — Z8249 Family history of ischemic heart disease and other diseases of the circulatory system: Secondary | ICD-10-CM | POA: Diagnosis not present

## 2021-04-24 DIAGNOSIS — R5383 Other fatigue: Secondary | ICD-10-CM | POA: Diagnosis present

## 2021-04-24 DIAGNOSIS — N179 Acute kidney failure, unspecified: Secondary | ICD-10-CM | POA: Diagnosis present

## 2021-04-24 DIAGNOSIS — E860 Dehydration: Secondary | ICD-10-CM | POA: Diagnosis present

## 2021-04-24 DIAGNOSIS — R42 Dizziness and giddiness: Secondary | ICD-10-CM | POA: Diagnosis present

## 2021-04-24 DIAGNOSIS — K529 Noninfective gastroenteritis and colitis, unspecified: Secondary | ICD-10-CM | POA: Diagnosis present

## 2021-04-24 DIAGNOSIS — F17210 Nicotine dependence, cigarettes, uncomplicated: Secondary | ICD-10-CM | POA: Diagnosis present

## 2021-04-24 DIAGNOSIS — M109 Gout, unspecified: Secondary | ICD-10-CM | POA: Diagnosis present

## 2021-04-24 DIAGNOSIS — Z79899 Other long term (current) drug therapy: Secondary | ICD-10-CM | POA: Diagnosis not present

## 2021-04-24 DIAGNOSIS — Z9114 Patient's other noncompliance with medication regimen: Secondary | ICD-10-CM | POA: Diagnosis not present

## 2021-04-24 DIAGNOSIS — N390 Urinary tract infection, site not specified: Secondary | ICD-10-CM | POA: Diagnosis present

## 2021-04-24 DIAGNOSIS — Z6841 Body Mass Index (BMI) 40.0 and over, adult: Secondary | ICD-10-CM | POA: Diagnosis not present

## 2021-04-24 DIAGNOSIS — Z20822 Contact with and (suspected) exposure to covid-19: Secondary | ICD-10-CM | POA: Diagnosis present

## 2021-04-24 DIAGNOSIS — E1165 Type 2 diabetes mellitus with hyperglycemia: Secondary | ICD-10-CM | POA: Diagnosis present

## 2021-04-24 LAB — CBC WITH DIFFERENTIAL/PLATELET
Abs Immature Granulocytes: 0.12 10*3/uL — ABNORMAL HIGH (ref 0.00–0.07)
Basophils Absolute: 0.1 10*3/uL (ref 0.0–0.1)
Basophils Relative: 1 %
Eosinophils Absolute: 0.3 10*3/uL (ref 0.0–0.5)
Eosinophils Relative: 2 %
HCT: 29.4 % — ABNORMAL LOW (ref 36.0–46.0)
Hemoglobin: 9.2 g/dL — ABNORMAL LOW (ref 12.0–15.0)
Immature Granulocytes: 1 %
Lymphocytes Relative: 13 %
Lymphs Abs: 1.6 10*3/uL (ref 0.7–4.0)
MCH: 28 pg (ref 26.0–34.0)
MCHC: 31.3 g/dL (ref 30.0–36.0)
MCV: 89.6 fL (ref 80.0–100.0)
Monocytes Absolute: 1 10*3/uL (ref 0.1–1.0)
Monocytes Relative: 8 %
Neutro Abs: 9.2 10*3/uL — ABNORMAL HIGH (ref 1.7–7.7)
Neutrophils Relative %: 75 %
Platelets: 382 10*3/uL (ref 150–400)
RBC: 3.28 MIL/uL — ABNORMAL LOW (ref 3.87–5.11)
RDW: 13.7 % (ref 11.5–15.5)
WBC: 12.2 10*3/uL — ABNORMAL HIGH (ref 4.0–10.5)
nRBC: 0 % (ref 0.0–0.2)

## 2021-04-24 LAB — RESP PANEL BY RT-PCR (FLU A&B, COVID) ARPGX2
Influenza A by PCR: NEGATIVE
Influenza B by PCR: NEGATIVE
SARS Coronavirus 2 by RT PCR: NEGATIVE

## 2021-04-24 LAB — URINALYSIS, ROUTINE W REFLEX MICROSCOPIC
Bilirubin Urine: NEGATIVE
Glucose, UA: NEGATIVE mg/dL
Ketones, ur: NEGATIVE mg/dL
Nitrite: NEGATIVE
Protein, ur: NEGATIVE mg/dL
Specific Gravity, Urine: 1.01 (ref 1.005–1.030)
pH: 5 (ref 5.0–8.0)

## 2021-04-24 LAB — COMPREHENSIVE METABOLIC PANEL
ALT: 6 U/L (ref 0–44)
AST: 11 U/L — ABNORMAL LOW (ref 15–41)
Albumin: 2.5 g/dL — ABNORMAL LOW (ref 3.5–5.0)
Alkaline Phosphatase: 73 U/L (ref 38–126)
Anion gap: 9 (ref 5–15)
BUN: 27 mg/dL — ABNORMAL HIGH (ref 6–20)
CO2: 23 mmol/L (ref 22–32)
Calcium: 8.6 mg/dL — ABNORMAL LOW (ref 8.9–10.3)
Chloride: 107 mmol/L (ref 98–111)
Creatinine, Ser: 2.22 mg/dL — ABNORMAL HIGH (ref 0.44–1.00)
GFR, Estimated: 26 mL/min — ABNORMAL LOW (ref >60)
Glucose, Bld: 110 mg/dL — ABNORMAL HIGH (ref 70–99)
Potassium: 3.7 mmol/L (ref 3.5–5.1)
Sodium: 139 mmol/L (ref 135–145)
Total Bilirubin: 0.4 mg/dL (ref 0.3–1.2)
Total Protein: 6.9 g/dL (ref 6.5–8.1)

## 2021-04-24 LAB — HIV ANTIBODY (ROUTINE TESTING W REFLEX): HIV Screen 4th Generation wRfx: NONREACTIVE

## 2021-04-24 MED ORDER — MAGNESIUM SULFATE 2 GM/50ML IV SOLN
2.0000 g | Freq: Once | INTRAVENOUS | Status: AC
  Administered 2021-04-24: 2 g via INTRAVENOUS
  Filled 2021-04-24: qty 50

## 2021-04-24 MED ORDER — POTASSIUM CHLORIDE IN NACL 20-0.9 MEQ/L-% IV SOLN: INTRAVENOUS | Status: AC

## 2021-04-24 MED ORDER — PANTOPRAZOLE SODIUM 40 MG IV SOLR
40.0000 mg | INTRAVENOUS | Status: DC
  Administered 2021-04-24: 40 mg via INTRAVENOUS
  Filled 2021-04-24: qty 40

## 2021-04-24 MED ORDER — PANTOPRAZOLE SODIUM 40 MG PO TBEC
40.0000 mg | DELAYED_RELEASE_TABLET | Freq: Every day | ORAL | Status: DC
  Administered 2021-04-25 – 2021-04-27 (×3): 40 mg via ORAL
  Filled 2021-04-24 (×3): qty 1

## 2021-04-24 NOTE — Progress Notes (Signed)
PROGRESS NOTE    Courtney Dyer  [id number] DOB: 01-Jul-1966 DOA: 04/23/2021 PCP: Lucia Gaskins, MD   Chief Complaint  Patient presents with   Abdominal Pain    Brief admission Narrative:  As per H&P written by Dr. Olevia Bowens on 04/23/2021 Courtney Dyer is a 55 y.o. female with medical history significant of type 2 diabetes, gout, hyperlipidemia, class III obesity who is coming to the emergency department with a 1 week history of abdominal pain associated with multiple episodes of emesis, diarrhea, decreased appetite, mild lightheadedness, fatigue and malaise.  She denies coffee-ground or hematemesis, melena, hematochezia or mucus in stool.  No dysuria, flank pain, frequency or hematuria.  No sick contacts at home and no travel history.  She denies headache, rhinorrhea, sore throat, dyspnea, wheezing or hemoptysis.  No chest pain, palpitations, diaphoresis, PND, orthopnea, but stated she occasionally gets lower extremity edema.  She denies polyuria, polydipsia, polyphagia or blurred vision.  Denies pruritus or rashes.   ED Course: Initial vital signs were temperature 99.2 F, pulse 116, respiration 20, BP 111/68 mmHg O2 sat 95% on room air.  The patient t received 1000 mL NS bolus, KCl 40 mEq p.o. x1, KCl 10 mEq IVPB x1, Maalox with lidocaine, dicyclomine 920 mg IM, casein sulfate 2 g IVPB and ondansetron.   Lab work: CBC showed a white count of 17.1, hemoglobin 10.5 g/dL and platelets 419.  Her sodium was 133, potassium 2.9 mmol/L.  The rest of the electrolytes were normal when calcium was corrected to albumin.  Glucose 184, BUN 29, creatinine 2.42, magnesium 1.0 and calcium 8.7 mg/dL.  Total protein 7.5 and albumin 3.0 g/dL.  The rest of the LFTs are unremarkable.  Lipase was normal.  Imaging: CT abdomen/pelvis without contrast did not visualize the appendix, but there were no appendicitis signs.  Her cecum was tucked in the RUQ.  There were no acute findings.  Please see images and  phonated report for further detail.  Assessment & Plan: 1-acute renal failure -In the setting of prerenal azotemia and dehydration from GI losses -Improving with fluid resuscitation -Patient denies dysuria or urinary retention symptoms. -Still with intermittent nausea but no further vomiting. -Continue advancing diet, continue IV fluids and follow renal function trend.    2-High cholesterol -Patient currently off medication -Outpatient follow-up with PCP with repeat lipid panel recommended -Heart healthy diet encouraged.  3-Hypokalemia and hypomagnesemia -In the setting of dehydration and GI losses. -Continue to follow electrolytes and further replete as needed.  4-morbid obesity -Body mass index is 46.07 kg/m. -Low calorie diet, portion control and increase physical activity discussed with patient.  5-Normocytic anemia -No signs of overt bleeding -Will continue to monitor Hgb trend   6-hyperglycemia -No prior history of diabetes -Will recommend outpatient follow-up patient's CBGs fasting levels and to check A1c. -Weight loss and modify carbohydrates diet recommended.   DVT prophylaxis: Lovenox Code Status: Full code Family Communication: No family at bedside. Disposition:   Status is: Inpatient  Remains inpatient appropriate because:IV treatments appropriate due to intensity of illness or inability to take PO  Dispo: The patient is from: Home              Anticipated d/c is to: Home              Patient currently is not medically stable to d/c.   Difficult to place patient No       Consultants:  None  Procedures:  See below for x-ray reports.  Antimicrobials:  None  Subjective: No fever, chest pain, no shortness of breath or further vomiting.  Patient reports still experiencing intermittent nausea. Will liketo have diet advanced.  Objective: Vitals:   04/24/21 0749 04/24/21 0911 04/24/21 1214 04/24/21 1402  BP: 108/66 (!) 97/57 126/80 113/81   Pulse: 89 85 79 84  Resp: 18   20  Temp: 98.2 F (36.8 C)   98.2 F (36.8 C)  TempSrc: Oral   Oral  SpO2: 97% 99% 98% 100%  Weight:      Height:        Intake/Output Summary (Last 24 hours) at 04/24/2021 1537 Last data filed at 04/24/2021 1300 Gross per 24 hour  Intake 2495.77 ml  Output 2 ml  Net 2493.77 ml   Filed Weights   04/23/21 1924  Weight: 107 kg    Examination:  General exam: Appears calm and comfortable; Reporting intermittent nausea but no further vomiting.  No chest pain, no SOB. Respiratory system: Clear to auscultation. Respiratory effort normal. No using accessory muscles. Cardiovascular system: S1 & S2 heard, RRR.  No rubs, no gallops, unable to properly assess for JVD due to body habitus. Gastrointestinal system: Abdomen is obese, nondistended, soft and without guarding. Normal bowel sounds heard. Central nervous system: Alert and oriented. No focal neurological deficits. Extremities: no cyanosis, no clubbing Skin: No petechiae.  Psychiatry: Mood & affect appropriate.     Data Reviewed: I have personally reviewed following labs and imaging studies  CBC: Recent Labs  Lab 04/23/21 2016 04/24/21 0509  WBC 17.1* 12.2*  NEUTROABS  --  9.2*  HGB 10.5* 9.2*  HCT 32.9* 29.4*  MCV 88.7 89.6  PLT 419* [id number]    Basic Metabolic Panel: Recent Labs  Lab 04/23/21 2016 04/24/21 0509  NA 133* 139  K 2.9* 3.7  CL 98 107  CO2 23 23  GLUCOSE 184* 110*  BUN 29* 27*  CREATININE 2.42* 2.22*  CALCIUM 8.7* 8.6*  MG 1.0*  --     GFR: Estimated Creatinine Clearance: 32.4 mL/min (A) (by C-G formula based on SCr of 2.22 mg/dL (H)).  Liver Function Tests: Recent Labs  Lab 04/23/21 2016 04/24/21 0509  AST 13* 11*  ALT 6 6  ALKPHOS 82 73  BILITOT 0.6 0.4  PROT 8.5* 6.9  ALBUMIN 3.0* 2.5*    CBG: No results for input(s): GLUCAP in the last 168 hours.   Recent Results (from the past 240 hour(s))  Resp Panel by RT-PCR (Flu A&B, Covid)  Nasopharyngeal Swab     Status: None   Collection Time: 04/23/21 11:07 PM   Specimen: Nasopharyngeal Swab; Nasopharyngeal(NP) swabs in vial transport medium  Result Value Ref Range Status   SARS Coronavirus 2 by RT PCR NEGATIVE NEGATIVE Final    Comment: (NOTE) SARS-CoV-2 target nucleic acids are NOT DETECTED.  The SARS-CoV-2 RNA is generally detectable in upper respiratory specimens during the acute phase of infection. The lowest concentration of SARS-CoV-2 viral copies this assay can detect is 138 copies/mL. A negative result does not preclude SARS-Cov-2 infection and should not be used as the sole basis for treatment or other patient management decisions. A negative result may occur with  improper specimen collection/handling, submission of specimen other than nasopharyngeal swab, presence of viral mutation(s) within the areas targeted by this assay, and inadequate number of viral copies(<138 copies/mL). A negative result must be combined with clinical observations, patient history, and epidemiological information. The expected result is Negative.  Fact Sheet for Patients:  EntrepreneurPulse.com.au  Fact Sheet for Healthcare Providers:  IncredibleEmployment.be  This test is no t yet approved or cleared by the [place] FDA and  has been authorized for detection and/or diagnosis of SARS-CoV-2 by FDA under an Emergency Use Authorization (EUA). This EUA will remain  in effect (meaning this test can be used) for the duration of the COVID-19 declaration under Section 564(b)(1) of the Act, 21 U.S.C.section 360bbb-3(b)(1), unless the authorization is terminated  or revoked sooner.       Influenza A by PCR NEGATIVE NEGATIVE Final   Influenza B by PCR NEGATIVE NEGATIVE Final    Comment: (NOTE) The Xpert Xpress SARS-CoV-2/FLU/RSV plus assay is intended as an aid in the diagnosis of influenza from Nasopharyngeal swab specimens and should not be  used as a sole basis for treatment. Nasal washings and aspirates are unacceptable for Xpert Xpress SARS-CoV-2/FLU/RSV testing.  Fact Sheet for Patients: EntrepreneurPulse.com.au  Fact Sheet for Healthcare Providers: IncredibleEmployment.be  This test is not yet approved or cleared by the [place] FDA and has been authorized for detection and/or diagnosis of SARS-CoV-2 by FDA under an Emergency Use Authorization (EUA). This EUA will remain in effect (meaning this test can be used) for the duration of the COVID-19 declaration under Section 564(b)(1) of the Act, 21 U.S.C. section 360bbb-3(b)(1), unless the authorization is terminated or revoked.  Performed at [facility], [address] [place], [place] [address]      Radiology Studies: CT ABDOMEN PELVIS WO CONTRAST  Result Date: 04/23/2021 CLINICAL DATA:  Abdominal pain for 3 weeks. EXAM: CT ABDOMEN AND PELVIS WITHOUT CONTRAST TECHNIQUE: Multidetector CT imaging of the abdomen and pelvis was performed following the standard protocol without IV contrast. COMPARISON:  None. FINDINGS: Lower chest: Lung bases are clear. Hepatobiliary: No focal hepatic lesion. No biliary duct dilatation. Common bile duct is normal. Pancreas: Pancreas is normal. No ductal dilatation. No pancreatic inflammation. Spleen: Normal spleen Adrenals/urinary tract: Low-attenuation enlargement of the LEFT adrenal gland to 1.8 by 2.3 cm. Lesion does not meet criteria for benign adenoma on this noncontrast exam. The RIGHT kidney is atrophic. LEFT kidney appears normal. Stomach/Bowel: Stomach, duodenum small-bowel normal. Cecum is normal. Cecum is in the RIGHT upper quadrant beneath the RIGHT hepatic lobe. Appendix is not identified however there are no secondary signs of appendicitis. The transverse colon and descending colon are normal. Rectosigmoid colon normal. Vascular/Lymphatic: Abdominal aorta is normal caliber. No periportal or  retroperitoneal adenopathy. No pelvic adenopathy. Reproductive: Uterus and adnexa unremarkable. Other: No free fluid.  Small fat filled RIGHT umbilical hernia. Musculoskeletal: No aggressive osseous lesion. Degenerative osteophytosis of the spine. IMPRESSION: 1. While the appendix not identified, no secondary signs of appendicitis. The cecum is tucked in the RIGHT upper quadrant. 2. No acute findings in the abdomen pelvis. 3. Low-density enlargement of the LEFT adrenal gland is favored benign however cannot be fully characterized on current exam. 4. RIGHT kidney is atrophic. Electronically Signed   By: Suzy Bouchard M.D.   On: 04/23/2021 21:57     Scheduled Meds:  enoxaparin (LOVENOX) injection  40 mg Subcutaneous Q24H   [START ON 04/25/2021] pantoprazole  40 mg Oral Daily   Continuous Infusions:  0.9 % NaCl with KCl 20 mEq / L       LOS: 0 days    Time spent: 30 minutes    Barton Dubois, MD [facility]   To contact the attending provider between 7A-7P or the covering provider during after hours 7P-7A, please log into the web  site [email or web address] and access using universal [place] password for that web site. If you do not have the password, please call the hospital operator.  04/24/2021, 3:37 PM

## 2021-04-25 LAB — BASIC METABOLIC PANEL
Anion gap: 6 (ref 5–15)
BUN: 22 mg/dL — ABNORMAL HIGH (ref 6–20)
CO2: 23 mmol/L (ref 22–32)
Calcium: 8.3 mg/dL — ABNORMAL LOW (ref 8.9–10.3)
Chloride: 108 mmol/L (ref 98–111)
Creatinine, Ser: 2.05 mg/dL — ABNORMAL HIGH (ref 0.44–1.00)
GFR, Estimated: 28 mL/min — ABNORMAL LOW (ref >60)
Glucose, Bld: 102 mg/dL — ABNORMAL HIGH (ref 70–99)
Potassium: 4 mmol/L (ref 3.5–5.1)
Sodium: 137 mmol/L (ref 135–145)

## 2021-04-25 LAB — HEMOGLOBIN A1C
Hgb A1c MFr Bld: 5.9 % — ABNORMAL HIGH (ref 4.8–5.6)
Mean Plasma Glucose: 123 mg/dL

## 2021-04-25 NOTE — Progress Notes (Signed)
PROGRESS NOTE  Courtney Dyer [id number] DOB: Dec 15, 1965 DOA: 04/23/2021 PCP: Lucia Gaskins, MD  HPI/Recap of past 24 hours: Patient seen and examined at bedside.  She stated she is doing better she does not have any more nausea or vomiting.  She was able to tolerate her full liquid  Assessment/Plan: Principal Problem:   AKI (acute kidney injury) ([place]) Active Problems:   High cholesterol   Hypokalemia   Normocytic anemia   Hypomagnesemia  #1 acute kidney failure Due to prerenal azotemia from dehydration and GI loss due to nausea vomiting  2.  Hypercholesterolemia continue current medication  3.  Hyperglycemia.  Patient had no prior history patient will need outpatient follow-up for more definitive treatment Patient advised to lose weight  4.  Morbid obesity.  Patient was engaged in low-calorie control portion diet  Code Status: Full  Severity of Illness: The appropriate patient status for this patient is INPATIENT. Inpatient status is judged to be reasonable and necessary in order to provide the required intensity of service to ensure the patient's safety. The patient's presenting symptoms, physical exam findings, and initial radiographic and laboratory data in the context of their chronic comorbidities is felt to place them at high risk for further clinical deterioration. Furthermore, it is not anticipated that the patient will be medically stable for discharge from the hospital within 2 midnights of admission. The following factors support the patient status of inpatient.   " The patient's presenting symptoms include continue to require inpatient we will slowly advance her diet    * I certify that at the point of admission it is my clinical judgment that the patient will require inpatient hospital care spanning beyond 2 midnights from the point of admission due to high intensity of service, high risk for further deterioration and high frequency of surveillance  required.*   Family Communication: Patient  Disposition Plan: Table   Consultants: Nephrology  Procedures: None  Antimicrobials: None  DVT prophylaxis: Lovenox   Objective: Vitals:   04/24/21 2236 04/25/21 0629 04/25/21 1433 04/25/21 1958  BP: (!) 147/76 140/81 129/83 125/87  Pulse: 81 84 87 86  Resp: '20 18 18 16  '$ Temp: (!) 97.2 F (36.2 C) 98.6 F (37 C) 98.7 F (37.1 C) 99.1 F (37.3 C)  TempSrc:  Oral Oral Oral  SpO2: 100% 100% 100% 100%  Weight:      Height:        Intake/Output Summary (Last 24 hours) at 04/25/2021 2319 Last data filed at 04/25/2021 1700 Gross per 24 hour  Intake 3008.13 ml  Output 5 ml  Net 3003.13 ml   Filed Weights   04/23/21 1924  Weight: 107 kg   Body mass index is 46.07 kg/m.  Exam:  General: 55 y.o. year-old female well developed well nourished in no acute distress.  Alert and oriented x3.  Obese.  No distress Cardiovascular: Regular rate and rhythm with no rubs or gallops.  No thyromegaly or JVD noted.   Respiratory: Clear to auscultation with no wheezes or rales. Good inspiratory effort. Abdomen: Soft nontender nondistended with normal bowel sounds x4 quadrants. Musculoskeletal: No lower extremity edema. 2/4 pulses in all 4 extremities. Skin: No ulcerative lesions noted or rashes, Psychiatry: Mood is appropriate for condition and setting    Data Reviewed: CBC: Recent Labs  Lab 04/23/21 2016 04/24/21 0509  WBC 17.1* 12.2*  NEUTROABS  --  9.2*  HGB 10.5* 9.2*  HCT 32.9* 29.4*  MCV 88.7 89.6  PLT 419* 382  Basic Metabolic Panel: Recent Labs  Lab 04/23/21 2016 04/24/21 0509 04/25/21 0418  NA 133* 139 137  K 2.9* 3.7 4.0  CL 98 107 108  CO2 '23 23 23  '$ GLUCOSE 184* 110* 102*  BUN 29* 27* 22*  CREATININE 2.42* 2.22* 2.05*  CALCIUM 8.7* 8.6* 8.3*  MG 1.0*  --   --    GFR: Estimated Creatinine Clearance: 35.1 mL/min (A) (by C-G formula based on SCr of 2.05 mg/dL (H)). Liver Function Tests: Recent Labs   Lab 04/23/21 2016 04/24/21 0509  AST 13* 11*  ALT 6 6  ALKPHOS 82 73  BILITOT 0.6 0.4  PROT 8.5* 6.9  ALBUMIN 3.0* 2.5*   Recent Labs  Lab 04/23/21 2016  LIPASE 25   No results for input(s): AMMONIA in the last 168 hours. Coagulation Profile: No results for input(s): INR, PROTIME in the last 168 hours. Cardiac Enzymes: No results for input(s): CKTOTAL, CKMB, CKMBINDEX, TROPONINI in the last 168 hours. BNP (last 3 results) No results for input(s): PROBNP in the last 8760 hours. HbA1C: Recent Labs    04/24/21 0509  HGBA1C 5.9*   CBG: No results for input(s): GLUCAP in the last 168 hours. Lipid Profile: No results for input(s): CHOL, HDL, LDLCALC, TRIG, CHOLHDL, LDLDIRECT in the last 72 hours. Thyroid Function Tests: No results for input(s): TSH, T4TOTAL, FREET4, T3FREE, THYROIDAB in the last 72 hours. Anemia Panel: No results for input(s): VITAMINB12, FOLATE, FERRITIN, TIBC, IRON, RETICCTPCT in the last 72 hours. Urine analysis:    Component Value Date/Time   COLORURINE YELLOW 04/24/2021 1710   APPEARANCEUR HAZY (A) 04/24/2021 1710   LABSPEC 1.010 04/24/2021 1710   PHURINE 5.0 04/24/2021 1710   GLUCOSEU NEGATIVE 04/24/2021 1710   HGBUR SMALL (A) 04/24/2021 1710   BILIRUBINUR NEGATIVE 04/24/2021 1710   KETONESUR NEGATIVE 04/24/2021 1710   PROTEINUR NEGATIVE 04/24/2021 1710   NITRITE NEGATIVE 04/24/2021 1710   LEUKOCYTESUR SMALL (A) 04/24/2021 1710   Sepsis Labs: '@LABRCNTIP'$ (procalcitonin:4,lacticidven:4)  ) Recent Results (from the past 240 hour(s))  Resp Panel by RT-PCR (Flu A&B, Covid) Nasopharyngeal Swab     Status: None   Collection Time: 04/23/21 11:07 PM   Specimen: Nasopharyngeal Swab; Nasopharyngeal(NP) swabs in vial transport medium  Result Value Ref Range Status   SARS Coronavirus 2 by RT PCR NEGATIVE NEGATIVE Final    Comment: (NOTE) SARS-CoV-2 target nucleic acids are NOT DETECTED.  The SARS-CoV-2 RNA is generally detectable in upper  respiratory specimens during the acute phase of infection. The lowest concentration of SARS-CoV-2 viral copies this assay can detect is 138 copies/mL. A negative result does not preclude SARS-Cov-2 infection and should not be used as the sole basis for treatment or other patient management decisions. A negative result may occur with  improper specimen collection/handling, submission of specimen other than nasopharyngeal swab, presence of viral mutation(s) within the areas targeted by this assay, and inadequate number of viral copies(<138 copies/mL). A negative result must be combined with clinical observations, patient history, and epidemiological information. The expected result is Negative.  Fact Sheet for Patients:  EntrepreneurPulse.com.au  Fact Sheet for Healthcare Providers:  IncredibleEmployment.be  This test is no t yet approved or cleared by the [place] FDA and  has been authorized for detection and/or diagnosis of SARS-CoV-2 by FDA under an Emergency Use Authorization (EUA). This EUA will remain  in effect (meaning this test can be used) for the duration of the COVID-19 declaration under Section 564(b)(1) of the Act, 21 U.S.C.section 360bbb-3(b)(1), unless  the authorization is terminated  or revoked sooner.       Influenza A by PCR NEGATIVE NEGATIVE Final   Influenza B by PCR NEGATIVE NEGATIVE Final    Comment: (NOTE) The Xpert Xpress SARS-CoV-2/FLU/RSV plus assay is intended as an aid in the diagnosis of influenza from Nasopharyngeal swab specimens and should not be used as a sole basis for treatment. Nasal washings and aspirates are unacceptable for Xpert Xpress SARS-CoV-2/FLU/RSV testing.  Fact Sheet for Patients: EntrepreneurPulse.com.au  Fact Sheet for Healthcare Providers: IncredibleEmployment.be  This test is not yet approved or cleared by the [place] FDA and has been  authorized for detection and/or diagnosis of SARS-CoV-2 by FDA under an Emergency Use Authorization (EUA). This EUA will remain in effect (meaning this test can be used) for the duration of the COVID-19 declaration under Section 564(b)(1) of the Act, 21 U.S.C. section 360bbb-3(b)(1), unless the authorization is terminated or revoked.  Performed at [facility], [address] [place], [place] [address]       Studies: No results found.  Scheduled Meds:  enoxaparin (LOVENOX) injection  40 mg Subcutaneous Q24H   pantoprazole  40 mg Oral Daily    Continuous Infusions:   LOS: 1 day     Cristal Deer, MD [facility]  To reach me or the doctor on call, go to: [email or web address] Password [facility]  04/25/2021, 11:19 PM

## 2021-04-26 LAB — CBC WITH DIFFERENTIAL/PLATELET
Abs Immature Granulocytes: 0.09 10*3/uL — ABNORMAL HIGH (ref 0.00–0.07)
Basophils Absolute: 0.1 10*3/uL (ref 0.0–0.1)
Basophils Relative: 1 %
Eosinophils Absolute: 0.3 10*3/uL (ref 0.0–0.5)
Eosinophils Relative: 3 %
HCT: 28.9 % — ABNORMAL LOW (ref 36.0–46.0)
Hemoglobin: 9.1 g/dL — ABNORMAL LOW (ref 12.0–15.0)
Immature Granulocytes: 1 %
Lymphocytes Relative: 14 %
Lymphs Abs: 1.7 10*3/uL (ref 0.7–4.0)
MCH: 28.4 pg (ref 26.0–34.0)
MCHC: 31.5 g/dL (ref 30.0–36.0)
MCV: 90.3 fL (ref 80.0–100.0)
Monocytes Absolute: 1 10*3/uL (ref 0.1–1.0)
Monocytes Relative: 8 %
Neutro Abs: 9.3 10*3/uL — ABNORMAL HIGH (ref 1.7–7.7)
Neutrophils Relative %: 73 %
Platelets: 364 10*3/uL (ref 150–400)
RBC: 3.2 MIL/uL — ABNORMAL LOW (ref 3.87–5.11)
RDW: 13.9 % (ref 11.5–15.5)
WBC: 12.5 10*3/uL — ABNORMAL HIGH (ref 4.0–10.5)
nRBC: 0 % (ref 0.0–0.2)

## 2021-04-26 LAB — GLUCOSE, CAPILLARY: Glucose-Capillary: 146 mg/dL — ABNORMAL HIGH (ref 70–99)

## 2021-04-26 LAB — URINALYSIS, ROUTINE W REFLEX MICROSCOPIC
Bilirubin Urine: NEGATIVE
Glucose, UA: NEGATIVE mg/dL
Hgb urine dipstick: NEGATIVE
Ketones, ur: NEGATIVE mg/dL
Nitrite: NEGATIVE
Protein, ur: NEGATIVE mg/dL
Specific Gravity, Urine: 1.012 (ref 1.005–1.030)
pH: 5 (ref 5.0–8.0)

## 2021-04-26 MED ORDER — LISINOPRIL 10 MG PO TABS
10.0000 mg | ORAL_TABLET | Freq: Every day | ORAL | Status: DC
  Administered 2021-04-26 – 2021-04-27 (×2): 10 mg via ORAL
  Filled 2021-04-26 (×3): qty 1

## 2021-04-26 MED ORDER — SODIUM CHLORIDE 0.9 % IV SOLN
1.0000 g | INTRAVENOUS | Status: DC
  Administered 2021-04-26: 1 g via INTRAVENOUS
  Filled 2021-04-26: qty 10

## 2021-04-26 MED ORDER — INSULIN ASPART 100 UNIT/ML IJ SOLN: 0.0000 [IU] | Freq: Three times a day (TID) | INTRAMUSCULAR | Status: DC

## 2021-04-26 NOTE — Progress Notes (Signed)
Pt. Has been asked all night by nursing staff if she needs to use , and encouraging her to get up to use the bedside commode, and she has refused. '@[id number]'$  nursing staff and NT making rounds and the entire bed, floor soaked of urine, Pt was assisted to bed side chair, and assisted to shower to address hygiene. This is the second night in a row that similar occurrence has happened. Notified on Call Dr. Olevia Bowens , of loud urine smell  and concern of UTI, and awaiting orders.

## 2021-04-26 NOTE — Progress Notes (Signed)
Sent on call Dr. Olevia Bowens message bp 169/115 awaiting orders

## 2021-04-26 NOTE — Progress Notes (Signed)
PROGRESS NOTE  Courtney Dyer [id number] DOB: 05-16-1966 DOA: 04/23/2021 PCP: Lucia Gaskins, MD  HPI/Recap of past 24 hours: Patient seen and examined at bedside.  She stated she is doing better she does not have any more nausea or vomiting.  She was able to tolerate her full liquid  Update  04/26/2021 night shift reported that pt had foul smelling urine and was confused over night Patient seen and examined at bedside she is sitting in the chair she stated she is doing better she was able to eat her food would like to advance her food.  Nurse noted that she was a little weak.  Will obtain physical therapy evaluation  Assessment/Plan: Principal Problem:   AKI (acute kidney injury) ([place]) Active Problems:   High cholesterol   Hypokalemia   Normocytic anemia   Hypomagnesemia  #1 acute kidney failure Due to prerenal azotemia from dehydration and GI loss due to nausea vomiting  2.  Hypercholesterolemia continue current medication  3.  Hyperglycemia.  Patient had no prior history patient will need outpatient follow-up for more definitive treatment Patient advised to lose weight  4.  Morbid obesity.  Patient was engaged in low-calorie control portion diet  5.  Possible urinary tract infection nurse noted foul-smelling urine UA has been ordered initially she has a UTI I will start her on Rocephin  6.  Slight decrease endurance.  I will obtain physical therapy evaluation  Code Status: Full  Severity of Illness: The appropriate patient status for this patient is INPATIENT. Inpatient status is judged to be reasonable and necessary in order to provide the required intensity of service to ensure the patient's safety. The patient's presenting symptoms, physical exam findings, and initial radiographic and laboratory data in the context of their chronic comorbidities is felt to place them at high risk for further clinical deterioration. Furthermore, it is not anticipated that the  patient will be medically stable for discharge from the hospital within 2 midnights of admission. The following factors support the patient status of inpatient.   " The patient's presenting symptoms include continue to require inpatient we will slowly advance her diet    * I certify that at the point of admission it is my clinical judgment that the patient will require inpatient hospital care spanning beyond 2 midnights from the point of admission due to high intensity of service, high risk for further deterioration and high frequency of surveillance required.*   Family Communication: Patient  Disposition Plan: Table   Consultants: Nephrology  Procedures: None  Antimicrobials: None  DVT prophylaxis: Lovenox   Objective: Vitals:   04/25/21 1433 04/25/21 1958 04/26/21 0502 04/26/21 0953  BP: 129/83 125/87 (!) 169/115 120/74  Pulse: 87 86 97 96  Resp: '18 16 18 16  '$ Temp: 98.7 F (37.1 C) 99.1 F (37.3 C) 98.1 F (36.7 C)   TempSrc: Oral Oral Oral   SpO2: 100% 100% 100%   Weight:      Height:        Intake/Output Summary (Last 24 hours) at 04/26/2021 1052 Last data filed at 04/26/2021 0900 Gross per 24 hour  Intake 2145.2 ml  Output --  Net 2145.2 ml    Filed Weights   04/23/21 1924  Weight: 107 kg   Body mass index is 46.07 kg/m.  Exam:  General: 55 y.o. year-old female well developed well nourished in no acute distress.  Alert and oriented x3.  Obese.  No distress Cardiovascular: Regular rate and rhythm with no  rubs or gallops.  No thyromegaly or JVD noted.   Respiratory: Clear to auscultation with no wheezes or rales. Good inspiratory effort. Abdomen: Soft nontender nondistended with normal bowel sounds x4 quadrants. Musculoskeletal: No lower extremity edema. 2/4 pulses in all 4 extremities. Skin: No ulcerative lesions noted or rashes, Psychiatry: Mood is appropriate for condition and setting    Data Reviewed: CBC: Recent Labs  Lab 04/23/21 2016  04/24/21 0509 04/26/21 0157  WBC 17.1* 12.2* 12.5*  NEUTROABS  --  9.2* 9.3*  HGB 10.5* 9.2* 9.1*  HCT 32.9* 29.4* 28.9*  MCV 88.7 89.6 90.3  PLT 419* 382 [id number]    Basic Metabolic Panel: Recent Labs  Lab 04/23/21 2016 04/24/21 0509 04/25/21 0418  NA 133* 139 137  K 2.9* 3.7 4.0  CL 98 107 108  CO2 '23 23 23  '$ GLUCOSE 184* 110* 102*  BUN 29* 27* 22*  CREATININE 2.42* 2.22* 2.05*  CALCIUM 8.7* 8.6* 8.3*  MG 1.0*  --   --     GFR: Estimated Creatinine Clearance: 35.1 mL/min (A) (by C-G formula based on SCr of 2.05 mg/dL (H)). Liver Function Tests: Recent Labs  Lab 04/23/21 2016 04/24/21 0509  AST 13* 11*  ALT 6 6  ALKPHOS 82 73  BILITOT 0.6 0.4  PROT 8.5* 6.9  ALBUMIN 3.0* 2.5*    Recent Labs  Lab 04/23/21 2016  LIPASE 25    No results for input(s): AMMONIA in the last 168 hours. Coagulation Profile: No results for input(s): INR, PROTIME in the last 168 hours. Cardiac Enzymes: No results for input(s): CKTOTAL, CKMB, CKMBINDEX, TROPONINI in the last 168 hours. BNP (last 3 results) No results for input(s): PROBNP in the last 8760 hours. HbA1C: Recent Labs    04/24/21 0509  HGBA1C 5.9*    CBG: No results for input(s): GLUCAP in the last 168 hours. Lipid Profile: No results for input(s): CHOL, HDL, LDLCALC, TRIG, CHOLHDL, LDLDIRECT in the last 72 hours. Thyroid Function Tests: No results for input(s): TSH, T4TOTAL, FREET4, T3FREE, THYROIDAB in the last 72 hours. Anemia Panel: No results for input(s): VITAMINB12, FOLATE, FERRITIN, TIBC, IRON, RETICCTPCT in the last 72 hours. Urine analysis:    Component Value Date/Time   COLORURINE YELLOW 04/24/2021 1710   APPEARANCEUR HAZY (A) 04/24/2021 1710   LABSPEC 1.010 04/24/2021 1710   PHURINE 5.0 04/24/2021 1710   GLUCOSEU NEGATIVE 04/24/2021 1710   HGBUR SMALL (A) 04/24/2021 1710   BILIRUBINUR NEGATIVE 04/24/2021 1710   KETONESUR NEGATIVE 04/24/2021 1710   PROTEINUR NEGATIVE 04/24/2021 1710   NITRITE  NEGATIVE 04/24/2021 1710   LEUKOCYTESUR SMALL (A) 04/24/2021 1710   Sepsis Labs: '@LABRCNTIP'$ (procalcitonin:4,lacticidven:4)  ) Recent Results (from the past 240 hour(s))  Resp Panel by RT-PCR (Flu A&B, Covid) Nasopharyngeal Swab     Status: None   Collection Time: 04/23/21 11:07 PM   Specimen: Nasopharyngeal Swab; Nasopharyngeal(NP) swabs in vial transport medium  Result Value Ref Range Status   SARS Coronavirus 2 by RT PCR NEGATIVE NEGATIVE Final    Comment: (NOTE) SARS-CoV-2 target nucleic acids are NOT DETECTED.  The SARS-CoV-2 RNA is generally detectable in upper respiratory specimens during the acute phase of infection. The lowest concentration of SARS-CoV-2 viral copies this assay can detect is 138 copies/mL. A negative result does not preclude SARS-Cov-2 infection and should not be used as the sole basis for treatment or other patient management decisions. A negative result may occur with  improper specimen collection/handling, submission of specimen other than nasopharyngeal swab, presence of  viral mutation(s) within the areas targeted by this assay, and inadequate number of viral copies(<138 copies/mL). A negative result must be combined with clinical observations, patient history, and epidemiological information. The expected result is Negative.  Fact Sheet for Patients:  EntrepreneurPulse.com.au  Fact Sheet for Healthcare Providers:  IncredibleEmployment.be  This test is no t yet approved or cleared by the [place] FDA and  has been authorized for detection and/or diagnosis of SARS-CoV-2 by FDA under an Emergency Use Authorization (EUA). This EUA will remain  in effect (meaning this test can be used) for the duration of the COVID-19 declaration under Section 564(b)(1) of the Act, 21 U.S.C.section 360bbb-3(b)(1), unless the authorization is terminated  or revoked sooner.       Influenza A by PCR NEGATIVE NEGATIVE Final    Influenza B by PCR NEGATIVE NEGATIVE Final    Comment: (NOTE) The Xpert Xpress SARS-CoV-2/FLU/RSV plus assay is intended as an aid in the diagnosis of influenza from Nasopharyngeal swab specimens and should not be used as a sole basis for treatment. Nasal washings and aspirates are unacceptable for Xpert Xpress SARS-CoV-2/FLU/RSV testing.  Fact Sheet for Patients: EntrepreneurPulse.com.au  Fact Sheet for Healthcare Providers: IncredibleEmployment.be  This test is not yet approved or cleared by the [place] FDA and has been authorized for detection and/or diagnosis of SARS-CoV-2 by FDA under an Emergency Use Authorization (EUA). This EUA will remain in effect (meaning this test can be used) for the duration of the COVID-19 declaration under Section 564(b)(1) of the Act, 21 U.S.C. section 360bbb-3(b)(1), unless the authorization is terminated or revoked.  Performed at [facility], [address], [place], [place] [address]       Studies: No results found.  Scheduled Meds:  enoxaparin (LOVENOX) injection  40 mg Subcutaneous Q24H   lisinopril  10 mg Oral Daily   pantoprazole  40 mg Oral Daily    Continuous Infusions:   LOS: 2 days     Cristal Deer, MD [facility]  To reach me or the doctor on call, go to: [email or web address] Password [email or web address]  04/26/2021, 10:52 AM

## 2021-04-26 NOTE — Progress Notes (Signed)
[facility] night shift.  The staff reports that the patient's most recent blood pressure is 169/115 mmHg with a heart rate of 97 bpm.  She has a history of diabetes so I will start her on lisinopril 10 mg p.o. daily.  Tennis Must, MD.

## 2021-04-27 LAB — GLUCOSE, CAPILLARY
Glucose-Capillary: 105 mg/dL — ABNORMAL HIGH (ref 70–99)
Glucose-Capillary: 96 mg/dL (ref 70–99)
Glucose-Capillary: 92 mg/dL (ref 70–99)

## 2021-04-27 LAB — BASIC METABOLIC PANEL
Anion gap: 6 (ref 5–15)
BUN: 17 mg/dL (ref 6–20)
CO2: 23 mmol/L (ref 22–32)
Calcium: 8.7 mg/dL — ABNORMAL LOW (ref 8.9–10.3)
Chloride: 103 mmol/L (ref 98–111)
Creatinine, Ser: 1.79 mg/dL — ABNORMAL HIGH (ref 0.44–1.00)
GFR, Estimated: 33 mL/min — ABNORMAL LOW (ref >60)
Glucose, Bld: 111 mg/dL — ABNORMAL HIGH (ref 70–99)
Potassium: 3.8 mmol/L (ref 3.5–5.1)
Sodium: 132 mmol/L — ABNORMAL LOW (ref 135–145)

## 2021-04-27 MED ORDER — LISINOPRIL 10 MG PO TABS: 10.0000 mg | ORAL_TABLET | Freq: Every day | ORAL | 2 refills | Status: DC

## 2021-04-27 MED ORDER — ONDANSETRON HCL 4 MG PO TABS: 4.0000 mg | ORAL_TABLET | Freq: Four times a day (QID) | ORAL | 0 refills | Status: DC | PRN

## 2021-04-27 MED ORDER — FOSFOMYCIN TROMETHAMINE 3 G PO PACK
3.0000 g | PACK | Freq: Once | ORAL | Status: AC
  Administered 2021-04-27: 3 g via ORAL
  Filled 2021-04-27: qty 3

## 2021-04-27 NOTE — Care Management Important Message (Signed)
Important Message  Patient Details  Name: Courtney Dyer MRN: [id number] Date of Birth: [birth year]   Medicare Important Message Given:  Yes     Tommy Medal 04/27/2021, 1:12 PM

## 2021-04-27 NOTE — TOC Transition Note (Signed)
Transition of Care [facility]) - CM/SW Discharge Note   Patient Details  Name: Courtney Dyer MRN: [id number] Date of Birth: 03-12-1966  Transition of Care [facility]) CM/SW Contact:  Iona Beard, [place] Phone Number: 04/27/2021, 2:23 PM   Clinical Narrative:    TOC updated that pt was in need of [place] PT and a walker. CSW spoke with pt in room to complete assessment. Pt states that she lives with her aunt. Pt states that she is independent in completing her ADLs. Pt states that she does not drive but can find transportation when needed. Pt states that she had [place] once in the past but does not know what company was used. CSW inquired about pts interest in being set up with [facility] services. Pt states she is not interested in [facility] services at this time. Pt is agreeable to a walker. CSW spoke to [place] with [employer] to make referral. CSW to deliver walker to pt in room. CSW to scan delivery ticket to [employer]. TOC signing off.   Final next level of care: Home/Self Care Barriers to Discharge: No Barriers Identified   Patient Goals and CMS Choice Patient states their goals for this hospitalization and ongoing recovery are:: Go home CMS Medicare.gov Compare Post Acute Care list provided to:: Patient Choice offered to / list presented to : Patient  Discharge Placement                    Patient and family notified of of transfer: 04/27/21  Discharge Plan and Services                DME Arranged: Gilford Rile DME Agency: [employer] Date DME Agency Contacted: 04/27/21 Time DME Agency Contacted: [phone] Representative spoke with at DME Agency: Jodell Cipro HH Arranged: NA [place] Agency: NA        Social Determinants of Health ([place]) Interventions     Readmission Risk Interventions Readmission Risk Prevention Plan 04/27/2021  Medication Screening Complete  Transportation Screening Complete  Some recent data might be hidden

## 2021-04-27 NOTE — Plan of Care (Addendum)
  Problem: Acute Rehab PT Goals(only PT should resolve) Goal: Patient Will Transfer Sit To/From Stand Outcome: Progressing Flowsheets (Taken 04/27/2021 1542) Patient will transfer sit to/from stand:  with min guard assist  with supervision Goal: Pt Will Transfer Bed To Chair/Chair To Bed Outcome: Progressing Flowsheets (Taken 04/27/2021 1542) Pt will Transfer Bed to Chair/Chair to Bed:  min guard assist  with supervision Goal: Pt Will Ambulate Outcome: Progressing Flowsheets (Taken 04/27/2021 1542) Pt will Ambulate:  50 feet  with modified independence  with rolling walker   3:42 PM, 04/27/21 Jeneen Rinks Cousler SPT  3:45 PM, 04/27/21 Lonell Grandchild, MPT Physical Therapist with [facility] [address] [phone] office [phone] mobile phone

## 2021-04-27 NOTE — Discharge Summary (Signed)
Physician Discharge Summary  Courtney Dyer [id number] DOB: [birth year] DOA: 04/23/2021  PCP: Lucia Gaskins, MD  Admit date: 04/23/2021  Discharge date: 04/27/2021  Admitted From:Home  Disposition:  Home  Recommendations for Outpatient Follow-up:  Follow up with PCP in 1-2 weeks Continue on lisinopril 10 mg daily for blood pressure control and follow-up with PCP Zofran provided as needed for any nausea or vomiting  Home Health: Declines  Equipment/Devices: Home Walker  Discharge Condition:Stable  CODE STATUS: Full  Diet recommendation: Heart Healthy  Brief/Interim Summary: Courtney Dyer is a 55 y.o. female with medical history significant of type 2 diabetes, gout, hyperlipidemia, class III obesity who came to the emergency department with a 1 week history of abdominal pain associated with multiple episodes of emesis, diarrhea, decreased appetite, mild lightheadedness, fatigue and malaise.  She was admitted with prerenal AKI in the setting of dehydration with GI loss and was noted to have some gastroenteritis which has now resolved.  She is tolerating a soft diet.  She was noted to have some elevated blood pressure readings as well and was started on lisinopril while here.  She was also thought to have some urinary tract infection which was asymptomatic and was started on Rocephin and was given fosfomycin on the day of discharge.  No other acute events noted throughout the course of this admission.  She is overall stable for discharge today.  Discharge Diagnoses:  Principal Problem:   AKI (acute kidney injury) ([place]) Active Problems:   High cholesterol   Hypokalemia   Normocytic anemia   Hypomagnesemia  Principal discharge diagnosis: Prerenal AKI in the setting of acute gastroenteritis along with UTI.  Discharge Instructions  Discharge Instructions     Diet - low sodium heart healthy   Complete by: As directed    Increase activity slowly   Complete by: As  directed       Allergies as of 04/27/2021   No Known Allergies      Medication List     STOP taking these medications    sulfamethoxazole-trimethoprim 800-160 MG tablet Commonly known as: BACTRIM DS       TAKE these medications    lisinopril 10 MG tablet Commonly known as: ZESTRIL Take 1 tablet (10 mg total) by mouth daily. Start taking on: April 28, 2021   ondansetron 4 MG tablet Commonly known as: ZOFRAN Take 1 tablet (4 mg total) by mouth every 6 (six) hours as needed for nausea.               Durable Medical Equipment  (From admission, onward)           Start     Ordered   04/27/21 1435  For home use only DME Walker rolling  Once       Question Answer Comment  Walker: With 5 Inch Wheels   Patient needs a walker to treat with the following condition Weakness      04/27/21 1434            Follow-up Information     Lucia Gaskins, MD. Schedule an appointment as soon as possible for a visit in 1 week(s).   Specialty: Internal Medicine Contact information: [place] [phone]                No Known Allergies  Consultations: None   Procedures/Studies: CT ABDOMEN PELVIS WO CONTRAST  Result Date: 04/23/2021 CLINICAL DATA:  Abdominal pain for 3 weeks. EXAM: CT  ABDOMEN AND PELVIS WITHOUT CONTRAST TECHNIQUE: Multidetector CT imaging of the abdomen and pelvis was performed following the standard protocol without IV contrast. COMPARISON:  None. FINDINGS: Lower chest: Lung bases are clear. Hepatobiliary: No focal hepatic lesion. No biliary duct dilatation. Common bile duct is normal. Pancreas: Pancreas is normal. No ductal dilatation. No pancreatic inflammation. Spleen: Normal spleen Adrenals/urinary tract: Low-attenuation enlargement of the LEFT adrenal gland to 1.8 by 2.3 cm. Lesion does not meet criteria for benign adenoma on this noncontrast exam. The RIGHT kidney is atrophic. LEFT kidney appears  normal. Stomach/Bowel: Stomach, duodenum small-bowel normal. Cecum is normal. Cecum is in the RIGHT upper quadrant beneath the RIGHT hepatic lobe. Appendix is not identified however there are no secondary signs of appendicitis. The transverse colon and descending colon are normal. Rectosigmoid colon normal. Vascular/Lymphatic: Abdominal aorta is normal caliber. No periportal or retroperitoneal adenopathy. No pelvic adenopathy. Reproductive: Uterus and adnexa unremarkable. Other: No free fluid.  Small fat filled RIGHT umbilical hernia. Musculoskeletal: No aggressive osseous lesion. Degenerative osteophytosis of the spine. IMPRESSION: 1. While the appendix not identified, no secondary signs of appendicitis. The cecum is tucked in the RIGHT upper quadrant. 2. No acute findings in the abdomen pelvis. 3. Low-density enlargement of the LEFT adrenal gland is favored benign however cannot be fully characterized on current exam. 4. RIGHT kidney is atrophic. Electronically Signed   By: Suzy Bouchard M.D.   On: 04/23/2021 21:57     Discharge Exam: Vitals:   04/27/21 0804 04/27/21 1333  BP: 111/74 113/79  Pulse:  89  Resp:  18  Temp:  97.8 F (36.6 C)  SpO2:  100%   Vitals:   04/27/21 0440 04/27/21 0440 04/27/21 0804 04/27/21 1333  BP: 119/74 119/74 111/74 113/79  Pulse: (!) 105 (!) 105  89  Resp: '16 16  18  '$ Temp: 98.4 F (36.9 C) 98.4 F (36.9 C)  97.8 F (36.6 C)  TempSrc: Oral Oral  Oral  SpO2: 99% 99%  100%  Weight:      Height:        General: Pt is alert, awake, not in acute distress, obese Cardiovascular: RRR, S1/S2 +, no rubs, no gallops Respiratory: CTA bilaterally, no wheezing, no rhonchi Abdominal: Soft, NT, ND, bowel sounds + Extremities: no edema, no cyanosis    The results of significant diagnostics from this hospitalization (including imaging, microbiology, ancillary and laboratory) are listed below for reference.     Microbiology: Recent Results (from the past 240  hour(s))  Resp Panel by RT-PCR (Flu A&B, Covid) Nasopharyngeal Swab     Status: None   Collection Time: 04/23/21 11:07 PM   Specimen: Nasopharyngeal Swab; Nasopharyngeal(NP) swabs in vial transport medium  Result Value Ref Range Status   SARS Coronavirus 2 by RT PCR NEGATIVE NEGATIVE Final    Comment: (NOTE) SARS-CoV-2 target nucleic acids are NOT DETECTED.  The SARS-CoV-2 RNA is generally detectable in upper respiratory specimens during the acute phase of infection. The lowest concentration of SARS-CoV-2 viral copies this assay can detect is 138 copies/mL. A negative result does not preclude SARS-Cov-2 infection and should not be used as the sole basis for treatment or other patient management decisions. A negative result may occur with  improper specimen collection/handling, submission of specimen other than nasopharyngeal swab, presence of viral mutation(s) within the areas targeted by this assay, and inadequate number of viral copies(<138 copies/mL). A negative result must be combined with clinical observations, patient history, and epidemiological information. The expected result is  Negative.  Fact Sheet for Patients:  EntrepreneurPulse.com.au  Fact Sheet for Healthcare Providers:  IncredibleEmployment.be  This test is no t yet approved or cleared by the [place] FDA and  has been authorized for detection and/or diagnosis of SARS-CoV-2 by FDA under an Emergency Use Authorization (EUA). This EUA will remain  in effect (meaning this test can be used) for the duration of the COVID-19 declaration under Section 564(b)(1) of the Act, 21 U.S.C.section 360bbb-3(b)(1), unless the authorization is terminated  or revoked sooner.       Influenza A by PCR NEGATIVE NEGATIVE Final   Influenza B by PCR NEGATIVE NEGATIVE Final    Comment: (NOTE) The Xpert Xpress SARS-CoV-2/FLU/RSV plus assay is intended as an aid in the diagnosis of influenza from  Nasopharyngeal swab specimens and should not be used as a sole basis for treatment. Nasal washings and aspirates are unacceptable for Xpert Xpress SARS-CoV-2/FLU/RSV testing.  Fact Sheet for Patients: EntrepreneurPulse.com.au  Fact Sheet for Healthcare Providers: IncredibleEmployment.be  This test is not yet approved or cleared by the [place] FDA and has been authorized for detection and/or diagnosis of SARS-CoV-2 by FDA under an Emergency Use Authorization (EUA). This EUA will remain in effect (meaning this test can be used) for the duration of the COVID-19 declaration under Section 564(b)(1) of the Act, 21 U.S.C. section 360bbb-3(b)(1), unless the authorization is terminated or revoked.  Performed at [facility], [address] [place], [place] [address]      Labs: BNP (last 3 results) No results for input(s): BNP in the last 8760 hours. Basic Metabolic Panel: Recent Labs  Lab 04/23/21 2016 04/24/21 0509 04/25/21 0418 04/27/21 0432  NA 133* 139 137 132*  K 2.9* 3.7 4.0 3.8  CL 98 107 108 103  CO2 '23 23 23 23  '$ GLUCOSE 184* 110* 102* 111*  BUN 29* 27* 22* 17  CREATININE 2.42* 2.22* 2.05* 1.79*  CALCIUM 8.7* 8.6* 8.3* 8.7*  MG 1.0*  --   --   --    Liver Function Tests: Recent Labs  Lab 04/23/21 2016 04/24/21 0509  AST 13* 11*  ALT 6 6  ALKPHOS 82 73  BILITOT 0.6 0.4  PROT 8.5* 6.9  ALBUMIN 3.0* 2.5*   Recent Labs  Lab 04/23/21 2016  LIPASE 25   No results for input(s): AMMONIA in the last 168 hours. CBC: Recent Labs  Lab 04/23/21 2016 04/24/21 0509 04/26/21 0157  WBC 17.1* 12.2* 12.5*  NEUTROABS  --  9.2* 9.3*  HGB 10.5* 9.2* 9.1*  HCT 32.9* 29.4* 28.9*  MCV 88.7 89.6 90.3  PLT 419* 382 364   Cardiac Enzymes: No results for input(s): CKTOTAL, CKMB, CKMBINDEX, TROPONINI in the last 168 hours. BNP: Invalid input(s): POCBNP CBG: Recent Labs  Lab 04/26/21 1533 04/27/21 1125  GLUCAP 146* 96    D-Dimer No results for input(s): DDIMER in the last 72 hours. Hgb A1c No results for input(s): HGBA1C in the last 72 hours. Lipid Profile No results for input(s): CHOL, HDL, LDLCALC, TRIG, CHOLHDL, LDLDIRECT in the last 72 hours. Thyroid function studies No results for input(s): TSH, T4TOTAL, T3FREE, THYROIDAB in the last 72 hours.  Invalid input(s): FREET3 Anemia work up No results for input(s): VITAMINB12, FOLATE, FERRITIN, TIBC, IRON, RETICCTPCT in the last 72 hours. Urinalysis    Component Value Date/Time   COLORURINE YELLOW 04/26/2021 1156   APPEARANCEUR HAZY (A) 04/26/2021 1156   LABSPEC 1.012 04/26/2021 1156   PHURINE 5.0 04/26/2021 1156   GLUCOSEU NEGATIVE  04/26/2021 [place] 04/26/2021 [place] 04/26/2021 [place] 04/26/2021 1156   PROTEINUR NEGATIVE 04/26/2021 1156   NITRITE NEGATIVE 04/26/2021 1156   LEUKOCYTESUR MODERATE (A) 04/26/2021 1156   Sepsis Labs Invalid input(s): PROCALCITONIN,  WBC,  LACTICIDVEN Microbiology Recent Results (from the past 240 hour(s))  Resp Panel by RT-PCR (Flu A&B, Covid) Nasopharyngeal Swab     Status: None   Collection Time: 04/23/21 11:07 PM   Specimen: Nasopharyngeal Swab; Nasopharyngeal(NP) swabs in vial transport medium  Result Value Ref Range Status   SARS Coronavirus 2 by RT PCR NEGATIVE NEGATIVE Final    Comment: (NOTE) SARS-CoV-2 target nucleic acids are NOT DETECTED.  The SARS-CoV-2 RNA is generally detectable in upper respiratory specimens during the acute phase of infection. The lowest concentration of SARS-CoV-2 viral copies this assay can detect is 138 copies/mL. A negative result does not preclude SARS-Cov-2 infection and should not be used as the sole basis for treatment or other patient management decisions. A negative result may occur with  improper specimen collection/handling, submission of specimen other than nasopharyngeal swab, presence of viral mutation(s)  within the areas targeted by this assay, and inadequate number of viral copies(<138 copies/mL). A negative result must be combined with clinical observations, patient history, and epidemiological information. The expected result is Negative.  Fact Sheet for Patients:  EntrepreneurPulse.com.au  Fact Sheet for Healthcare Providers:  IncredibleEmployment.be  This test is no t yet approved or cleared by the [place] FDA and  has been authorized for detection and/or diagnosis of SARS-CoV-2 by FDA under an Emergency Use Authorization (EUA). This EUA will remain  in effect (meaning this test can be used) for the duration of the COVID-19 declaration under Section 564(b)(1) of the Act, 21 U.S.C.section 360bbb-3(b)(1), unless the authorization is terminated  or revoked sooner.       Influenza A by PCR NEGATIVE NEGATIVE Final   Influenza B by PCR NEGATIVE NEGATIVE Final    Comment: (NOTE) The Xpert Xpress SARS-CoV-2/FLU/RSV plus assay is intended as an aid in the diagnosis of influenza from Nasopharyngeal swab specimens and should not be used as a sole basis for treatment. Nasal washings and aspirates are unacceptable for Xpert Xpress SARS-CoV-2/FLU/RSV testing.  Fact Sheet for Patients: EntrepreneurPulse.com.au  Fact Sheet for Healthcare Providers: IncredibleEmployment.be  This test is not yet approved or cleared by the [place] FDA and has been authorized for detection and/or diagnosis of SARS-CoV-2 by FDA under an Emergency Use Authorization (EUA). This EUA will remain in effect (meaning this test can be used) for the duration of the COVID-19 declaration under Section 564(b)(1) of the Act, 21 U.S.C. section 360bbb-3(b)(1), unless the authorization is terminated or revoked.  Performed at [facility], [address] [place], [place] [address]      Time coordinating discharge: 35  minutes  SIGNED:   Rodena Goldmann, DO [facility] 04/27/2021, 2:37 PM  If 7PM-7AM, please contact night-coverage [email or web address]

## 2021-04-27 NOTE — Evaluation (Addendum)
Physical Therapy Evaluation Patient Details Name: Courtney Dyer MRN: [id number] DOB: [birth year] Today's Date: 04/27/2021   History of Present Illness  Courtney Dyer is a 55 y.o. female with medical history significant of type 2 diabetes, gout, hyperlipidemia, class III obesity who is coming to the emergency department with a 1 week history of abdominal pain associated with multiple episodes of emesis, diarrhea, decreased appetite, mild lightheadedness, fatigue and malaise.  She denies coffee-ground or hematemesis, melena, hematochezia or mucus in stool.  No dysuria, flank pain, frequency or hematuria.  No sick contacts at home and no travel history.  She denies headache, rhinorrhea, sore throat, dyspnea, wheezing or hemoptysis.  No chest pain, palpitations, diaphoresis, PND, orthopnea, but stated she occasionally gets lower extremity edema.  She denies polyuria, polydipsia, polyphagia or blurred vision.  Denies pruritus or rashes.   Clinical Impression  Patient presents alert, oriented and agreeable to therapy. Patient demonstrated good bed mobility and sitting balance at EOB. Patient required the use of RW to transfer to the commode prior to walking. Patient required verbal cues on hand placement for walker during transfer. Patient demonstrated good return to bed after ambulation with a RW, patient reported that her limited factor was the pain in her legs from the swelling but is better. Patient was able to transfer to chair without assistance. Patient as encouraged to remains sitting up for a few hours - nursing staff notified. Patient will benefit from continued physical therapy in hospital and recommended venue below to increase strength, balance, endurance for safe ADLs and gait.     Follow Up Recommendations Home health PT    Equipment Recommendations  Rolling walker with 5" wheels    Recommendations for Other Services       Precautions / Restrictions Precautions Precautions:  Fall Restrictions Weight Bearing Restrictions: No      Mobility  Bed Mobility Overal bed mobility: Modified Independent             General bed mobility comments: slow labored movements Patient Response: Cooperative  Transfers Overall transfer level: Modified independent Equipment used: Rolling walker (2 wheeled)             General transfer comment: Slow labored movement and used forearms on ther walker to help push herself up  Ambulation/Gait Ambulation/Gait assistance: Min guard Gait Distance (Feet): 30 Feet Assistive device: Rolling walker (2 wheeled) Gait Pattern/deviations: Decreased step length - left;Decreased stance time - right;Decreased step length - right;Decreased stance time - left;Decreased stride length Gait velocity: decreased   General Gait Details: slow labored movements with a kyphotic posture  Stairs            Wheelchair Mobility    Modified Rankin (Stroke Patients Only)       Balance Overall balance assessment: Modified Independent;Needs assistance Sitting-balance support: Feet supported Sitting balance-Leahy Scale: Fair Sitting balance - Comments: Fair/poor at EOB     Standing balance-Leahy Scale: Poor Standing balance comment: Poor/fair with RW                             Pertinent Vitals/Pain Pain Assessment: No/denies pain    Home Living Family/patient expects to be discharged to:: Private residence Living Arrangements: Other relatives Available Help at Discharge: Available PRN/intermittently Type of Home: House Home Access: Stairs to enter Entrance Stairs-Rails: None Entrance Stairs-Number of Steps: 1 Home Layout: One level Home Equipment: Cane - single point      Prior  Function Level of Independence: Independent               Hand Dominance        Extremity/Trunk Assessment   Upper Extremity Assessment Upper Extremity Assessment: Generalized weakness    Lower Extremity  Assessment Lower Extremity Assessment: Generalized weakness    Cervical / Trunk Assessment Cervical / Trunk Assessment: Kyphotic  Communication   Communication: No difficulties  Cognition Arousal/Alertness: Awake/alert Behavior During Therapy: WFL for tasks assessed/performed Overall Cognitive Status: Within Functional Limits for tasks assessed                                        General Comments      Exercises     Assessment/Plan    PT Assessment All further PT needs can be met in the next venue of care  PT Problem List Decreased strength;Decreased activity tolerance;Decreased balance;Decreased mobility;Decreased coordination       PT Treatment Interventions DME instruction;Gait training;Functional mobility training;Therapeutic activities;Therapeutic exercise;Balance training;Patient/family education    PT Goals (Current goals can be found in the Care Plan section)  Acute Rehab PT Goals Patient Stated Goal: return home PT Goal Formulation: With patient Time For Goal Achievement: 05/11/21 Potential to Achieve Goals: Good    Frequency Min 3X/week   Barriers to discharge        Co-evaluation               AM-PAC PT "6 Clicks" Mobility  Outcome Measure Help needed turning from your back to your side while in a flat bed without using bedrails?: None Help needed moving from lying on your back to sitting on the side of a flat bed without using bedrails?: None Help needed moving to and from a bed to a chair (including a wheelchair)?: A Little Help needed standing up from a chair using your arms (e.g., wheelchair or bedside chair)?: A Lot Help needed to walk in hospital room?: A Little Help needed climbing 3-5 steps with a railing? : A Lot 6 Click Score: 18    End of Session   Activity Tolerance: Patient tolerated treatment well;Patient limited by fatigue Patient left: in chair;with call bell/phone within reach Nurse Communication: Mobility  status PT Visit Diagnosis: Unsteadiness on feet (R26.81);Other abnormalities of gait and mobility (R26.89);Muscle weakness (generalized) (M62.81)    Time: 1340-1403 PT Time Calculation (min) (ACUTE ONLY): 23 min   Charges:   PT Evaluation $PT Eval Moderate Complexity: 1 Mod PT Treatments $Therapeutic Activity: 23-37 mins       3:46 PM, 04/27/21 Jeneen Rinks Cousler SPT  3:46 PM, 04/27/21 Lonell Grandchild, MPT Physical Therapist with [facility] [phone] office [phone] mobile phone

## 2021-08-06 ENCOUNTER — Emergency Department (HOSPITAL_COMMUNITY): Payer: Medicare Other

## 2021-08-06 ENCOUNTER — Inpatient Hospital Stay (HOSPITAL_COMMUNITY)
Admission: EM | Admit: 2021-08-06 | Discharge: 2021-08-09 | DRG: 683 | Disposition: A | Discharge status: Home Health | Payer: Medicare Other | Attending: Internal Medicine | Admitting: Internal Medicine

## 2021-08-06 ENCOUNTER — Other Ambulatory Visit: Payer: Self-pay

## 2021-08-06 DIAGNOSIS — Z79899 Other long term (current) drug therapy: Secondary | ICD-10-CM

## 2021-08-06 DIAGNOSIS — D649 Anemia, unspecified: Secondary | ICD-10-CM | POA: Diagnosis not present

## 2021-08-06 DIAGNOSIS — E8729 Other acidosis: Secondary | ICD-10-CM

## 2021-08-06 DIAGNOSIS — R9431 Abnormal electrocardiogram [ECG] [EKG]: Secondary | ICD-10-CM | POA: Diagnosis present

## 2021-08-06 DIAGNOSIS — E7800 Pure hypercholesterolemia, unspecified: Secondary | ICD-10-CM | POA: Diagnosis present

## 2021-08-06 DIAGNOSIS — E876 Hypokalemia: Secondary | ICD-10-CM

## 2021-08-06 DIAGNOSIS — F17210 Nicotine dependence, cigarettes, uncomplicated: Secondary | ICD-10-CM | POA: Diagnosis present

## 2021-08-06 DIAGNOSIS — E279 Disorder of adrenal gland, unspecified: Secondary | ICD-10-CM | POA: Diagnosis present

## 2021-08-06 DIAGNOSIS — M109 Gout, unspecified: Secondary | ICD-10-CM | POA: Diagnosis present

## 2021-08-06 DIAGNOSIS — E860 Dehydration: Secondary | ICD-10-CM | POA: Diagnosis present

## 2021-08-06 DIAGNOSIS — Z20822 Contact with and (suspected) exposure to covid-19: Secondary | ICD-10-CM | POA: Diagnosis present

## 2021-08-06 DIAGNOSIS — Z905 Acquired absence of kidney: Secondary | ICD-10-CM | POA: Diagnosis not present

## 2021-08-06 DIAGNOSIS — N183 Chronic kidney disease, stage 3 unspecified: Secondary | ICD-10-CM

## 2021-08-06 DIAGNOSIS — N390 Urinary tract infection, site not specified: Secondary | ICD-10-CM | POA: Diagnosis present

## 2021-08-06 DIAGNOSIS — Q614 Renal dysplasia: Secondary | ICD-10-CM

## 2021-08-06 DIAGNOSIS — E278 Other specified disorders of adrenal gland: Secondary | ICD-10-CM | POA: Diagnosis present

## 2021-08-06 DIAGNOSIS — B372 Candidiasis of skin and nail: Secondary | ICD-10-CM | POA: Diagnosis present

## 2021-08-06 DIAGNOSIS — E872 Acidosis: Secondary | ICD-10-CM

## 2021-08-06 DIAGNOSIS — R1110 Vomiting, unspecified: Secondary | ICD-10-CM | POA: Diagnosis not present

## 2021-08-06 DIAGNOSIS — A084 Viral intestinal infection, unspecified: Secondary | ICD-10-CM | POA: Diagnosis present

## 2021-08-06 DIAGNOSIS — I10 Essential (primary) hypertension: Secondary | ICD-10-CM | POA: Diagnosis present

## 2021-08-06 DIAGNOSIS — Z6831 Body mass index (BMI) 31.0-31.9, adult: Secondary | ICD-10-CM

## 2021-08-06 DIAGNOSIS — N179 Acute kidney failure, unspecified: Principal | ICD-10-CM | POA: Diagnosis present

## 2021-08-06 DIAGNOSIS — E119 Type 2 diabetes mellitus without complications: Secondary | ICD-10-CM | POA: Diagnosis present

## 2021-08-06 DIAGNOSIS — E669 Obesity, unspecified: Secondary | ICD-10-CM | POA: Diagnosis present

## 2021-08-06 DIAGNOSIS — N1832 Chronic kidney disease, stage 3b: Secondary | ICD-10-CM | POA: Diagnosis not present

## 2021-08-06 DIAGNOSIS — D638 Anemia in other chronic diseases classified elsewhere: Secondary | ICD-10-CM | POA: Diagnosis present

## 2021-08-06 LAB — MAGNESIUM: Magnesium: 1.2 mg/dL — ABNORMAL LOW (ref 1.7–2.4)

## 2021-08-06 LAB — RESP PANEL BY RT-PCR (FLU A&B, COVID) ARPGX2
Influenza A by PCR: NEGATIVE
Influenza B by PCR: NEGATIVE
SARS Coronavirus 2 by RT PCR: NEGATIVE

## 2021-08-06 LAB — CBC WITH DIFFERENTIAL/PLATELET
Abs Immature Granulocytes: 0.09 10*3/uL — ABNORMAL HIGH (ref 0.00–0.07)
Basophils Absolute: 0 10*3/uL (ref 0.0–0.1)
Basophils Relative: 0 %
Eosinophils Absolute: 0 10*3/uL (ref 0.0–0.5)
Eosinophils Relative: 0 %
HCT: 30.8 % — ABNORMAL LOW (ref 36.0–46.0)
Hemoglobin: 9.9 g/dL — ABNORMAL LOW (ref 12.0–15.0)
Immature Granulocytes: 1 %
Lymphocytes Relative: 6 %
Lymphs Abs: 1.1 10*3/uL (ref 0.7–4.0)
MCH: 26.8 pg (ref 26.0–34.0)
MCHC: 32.1 g/dL (ref 30.0–36.0)
MCV: 83.5 fL (ref 80.0–100.0)
Monocytes Absolute: 0.6 10*3/uL (ref 0.1–1.0)
Monocytes Relative: 3 %
Neutro Abs: 16.6 10*3/uL — ABNORMAL HIGH (ref 1.7–7.7)
Neutrophils Relative %: 90 %
Platelets: 247 10*3/uL (ref 150–400)
RBC: 3.69 MIL/uL — ABNORMAL LOW (ref 3.87–5.11)
RDW: 14.7 % (ref 11.5–15.5)
WBC: 18.5 10*3/uL — ABNORMAL HIGH (ref 4.0–10.5)
nRBC: 0 % (ref 0.0–0.2)

## 2021-08-06 LAB — LACTIC ACID, PLASMA
Lactic Acid, Venous: 2.3 mmol/L (ref 0.5–1.9)
Lactic Acid, Venous: 2.3 mmol/L (ref 0.5–1.9)
Lactic Acid, Venous: 2.1 mmol/L (ref 0.5–1.9)

## 2021-08-06 LAB — URINALYSIS, ROUTINE W REFLEX MICROSCOPIC
Bilirubin Urine: NEGATIVE
Glucose, UA: NEGATIVE mg/dL
Ketones, ur: 5 mg/dL — AB
Nitrite: NEGATIVE
Protein, ur: 30 mg/dL — AB
Specific Gravity, Urine: 1.014 (ref 1.005–1.030)
WBC, UA: >50 WBC/hpf — ABNORMAL HIGH (ref 0–5)
pH: 5 (ref 5.0–8.0)

## 2021-08-06 LAB — COMPREHENSIVE METABOLIC PANEL
ALT: 8 U/L (ref 0–44)
AST: 15 U/L (ref 15–41)
Albumin: 2.9 g/dL — ABNORMAL LOW (ref 3.5–5.0)
Alkaline Phosphatase: 76 U/L (ref 38–126)
Anion gap: 22 — ABNORMAL HIGH (ref 5–15)
BUN: 56 mg/dL — ABNORMAL HIGH (ref 6–20)
CO2: 34 mmol/L — ABNORMAL HIGH (ref 22–32)
Calcium: 6.6 mg/dL — ABNORMAL LOW (ref 8.9–10.3)
Chloride: 79 mmol/L — ABNORMAL LOW (ref 98–111)
Creatinine, Ser: 5.3 mg/dL — ABNORMAL HIGH (ref 0.44–1.00)
GFR, Estimated: 9 mL/min — ABNORMAL LOW (ref >60)
Glucose, Bld: 153 mg/dL — ABNORMAL HIGH (ref 70–99)
Potassium: 2.5 mmol/L — CL (ref 3.5–5.1)
Sodium: 135 mmol/L (ref 135–145)
Total Bilirubin: 1.5 mg/dL — ABNORMAL HIGH (ref 0.3–1.2)
Total Protein: 7.7 g/dL (ref 6.5–8.1)

## 2021-08-06 LAB — PROTIME-INR
INR: 1.1 (ref 0.8–1.2)
Prothrombin Time: 13.9 seconds (ref 11.4–15.2)

## 2021-08-06 LAB — CBG MONITORING, ED: Glucose-Capillary: 142 mg/dL — ABNORMAL HIGH (ref 70–99)

## 2021-08-06 LAB — POTASSIUM: Potassium: 3.3 mmol/L — ABNORMAL LOW (ref 3.5–5.1)

## 2021-08-06 LAB — TROPONIN I (HIGH SENSITIVITY)
Troponin I (High Sensitivity): 9 ng/L (ref <18)
Troponin I (High Sensitivity): 9 ng/L (ref <18)

## 2021-08-06 LAB — APTT: aPTT: 23 seconds — ABNORMAL LOW (ref 24–36)

## 2021-08-06 MED ORDER — POTASSIUM CHLORIDE CRYS ER 20 MEQ PO TBCR
40.0000 meq | EXTENDED_RELEASE_TABLET | Freq: Once | ORAL | Status: AC
  Administered 2021-08-06: 40 meq via ORAL
  Filled 2021-08-06: qty 2

## 2021-08-06 MED ORDER — MAGNESIUM SULFATE 2 GM/50ML IV SOLN
2.0000 g | INTRAVENOUS | Status: AC
  Administered 2021-08-06: 2 g via INTRAVENOUS
  Filled 2021-08-06: qty 50

## 2021-08-06 MED ORDER — DIPHENHYDRAMINE HCL 50 MG/ML IJ SOLN
12.5000 mg | Freq: Once | INTRAMUSCULAR | Status: AC
  Administered 2021-08-06: 12.5 mg via INTRAVENOUS
  Filled 2021-08-06: qty 1

## 2021-08-06 MED ORDER — SODIUM CHLORIDE 0.9 % IV SOLN: INTRAVENOUS | Status: AC

## 2021-08-06 MED ORDER — PROMETHAZINE HCL 12.5 MG PO TABS
12.5000 mg | ORAL_TABLET | Freq: Four times a day (QID) | ORAL | Status: DC | PRN
  Administered 2021-08-06: 12.5 mg via ORAL
  Filled 2021-08-06: qty 1

## 2021-08-06 MED ORDER — SODIUM CHLORIDE 0.9 % IV BOLUS (SEPSIS)
1500.0000 mL | Freq: Once | INTRAVENOUS | Status: AC
  Administered 2021-08-06: 1500 mL via INTRAVENOUS

## 2021-08-06 MED ORDER — NYSTATIN 100000 UNIT/GM EX POWD
Freq: Three times a day (TID) | CUTANEOUS | Status: DC
  Filled 2021-08-06 (×2): qty 15

## 2021-08-06 MED ORDER — ACETAMINOPHEN 325 MG PO TABS: 650.0000 mg | ORAL_TABLET | Freq: Four times a day (QID) | ORAL | Status: DC | PRN

## 2021-08-06 MED ORDER — ACETAMINOPHEN 650 MG RE SUPP: 650.0000 mg | Freq: Four times a day (QID) | RECTAL | Status: DC | PRN

## 2021-08-06 MED ORDER — POLYETHYLENE GLYCOL 3350 17 G PO PACK: 17.0000 g | PACK | Freq: Every day | ORAL | Status: DC | PRN

## 2021-08-06 MED ORDER — SODIUM CHLORIDE 0.9 % IV BOLUS
1000.0000 mL | Freq: Once | INTRAVENOUS | Status: AC
  Administered 2021-08-06: 1000 mL via INTRAVENOUS

## 2021-08-06 MED ORDER — METOCLOPRAMIDE HCL 5 MG/ML IJ SOLN
10.0000 mg | Freq: Once | INTRAMUSCULAR | Status: AC
  Administered 2021-08-06: 10 mg via INTRAVENOUS
  Filled 2021-08-06: qty 2

## 2021-08-06 MED ORDER — POTASSIUM CHLORIDE 10 MEQ/100ML IV SOLN
10.0000 meq | INTRAVENOUS | Status: AC
  Administered 2021-08-06 (×4): 10 meq via INTRAVENOUS
  Filled 2021-08-06 (×3): qty 100

## 2021-08-06 MED ORDER — HEPARIN SODIUM (PORCINE) 5000 UNIT/ML IJ SOLN
5000.0000 [IU] | Freq: Three times a day (TID) | INTRAMUSCULAR | Status: DC
  Administered 2021-08-06 – 2021-08-09 (×8): 5000 [IU] via SUBCUTANEOUS
  Filled 2021-08-06 (×8): qty 1

## 2021-08-06 NOTE — ED Provider Notes (Signed)
[facility] EMERGENCY DEPARTMENT Provider Note   CSN: [id number] Arrival date & time: 08/06/21  1003     History Chief Complaint  Patient presents with   Weakness    CLORIA FOIST is a 55 y.o. female  with medical history significant of type 2 diabetes, gout, hyperlipidemia, class III obesity s/p nephrectomy who presents with vomiting and rash. Patient was admitted in June with aki and presistent vomiting. She presents with 2 weeks of intractable vomiting and nausea. She is vomiting innumerable times a day. She has been unable to tolerate foods, fluids or medications over the past several days. She c/o generalized abdominal pain and low back pain. She deneis urinary sxs. She has a "raw" rash that developed over the past 2 days under her breasts and abdominal pannus. She denies fever, cough or diarrhea.   The history is provided by the patient, a relative and medical records. No language interpreter was used.  Weakness Associated symptoms: abdominal pain, nausea and vomiting   Associated symptoms: no diarrhea and no fever       Past Medical History:  Diagnosis Date   Diabetes mellitus    Gout    High cholesterol     Patient Active Problem List   Diagnosis Date Noted   AKI (acute kidney injury) ([place]) 04/23/2021   Hypokalemia 04/23/2021   Normocytic anemia 04/23/2021   Hypomagnesemia 04/23/2021   High cholesterol     Past Surgical History:  Procedure Laterality Date   BREAST SURGERY     NEPHRECTOMY       OB History   No obstetric history on file.     Family History  Problem Relation Age of Onset   Hypertension Maternal Aunt     Social History   Tobacco Use   Smoking status: Every Day    Packs/day: 0.50    Types: Cigarettes   Smokeless tobacco: Never  Substance Use Topics   Alcohol use: Yes    Comment: daily   Drug use: No    Home Medications Prior to Admission medications   Medication Sig Start Date End Date Taking? Authorizing Provider   lisinopril (ZESTRIL) 10 MG tablet Take 1 tablet (10 mg total) by mouth daily. 04/28/21 05/28/21  Manuella Ghazi, Pratik D, DO  ondansetron (ZOFRAN) 4 MG tablet Take 1 tablet (4 mg total) by mouth every 6 (six) hours as needed for nausea. 04/27/21   Heath Lark D, DO    Allergies    Patient has no known allergies.  Review of Systems   Review of Systems  Constitutional:  Positive for activity change, appetite change and fatigue. Negative for chills and fever.  HENT: Negative.    Eyes: Negative.   Respiratory: Negative.    Cardiovascular: Negative.   Gastrointestinal:  Positive for abdominal pain, nausea and vomiting. Negative for diarrhea.  Genitourinary: Negative.   Musculoskeletal:  Positive for back pain.  Skin:  Positive for rash.  Neurological:  Positive for weakness and light-headedness.  Psychiatric/Behavioral:  Negative for confusion.    Physical Exam Updated Vital Signs LMP 09/26/2015   Physical Exam Vitals and nursing note reviewed.  Constitutional:      General: She is not in acute distress.    Appearance: She is well-developed. She is obese. She is ill-appearing. She is not diaphoretic.     Comments: Actively vomiting into emesis bag  HENT:     Head: Normocephalic and atraumatic.     Right Ear: External ear normal.     Left  Ear: External ear normal.     Nose: Nose normal.     Mouth/Throat:     Mouth: Mucous membranes are dry.  Eyes:     General: No scleral icterus.    Conjunctiva/sclera: Conjunctivae normal.  Cardiovascular:     Rate and Rhythm: Regular rhythm. Tachycardia present.     Heart sounds: Normal heart sounds. No murmur heard.   No friction rub. No gallop.  Pulmonary:     Effort: Pulmonary effort is normal. No respiratory distress.     Breath sounds: Normal breath sounds.  Abdominal:     General: Bowel sounds are normal. There is no distension.     Palpations: Abdomen is soft. There is no mass.     Tenderness: There is abdominal tenderness (generalized).  There is no guarding.  Musculoskeletal:     Cervical back: Normal range of motion.  Skin:    General: Skin is warm and dry.     Findings: Rash present.     Comments: Macerated, erythematous rash with areas of denuding under the abdominal pannus and BL breasts  Neurological:     Mental Status: She is alert and oriented to person, place, and time.  Psychiatric:        Behavior: Behavior normal.    ED Results / Procedures / Treatments   Labs (all labs ordered are listed, but only abnormal results are displayed) Labs Reviewed  CBC WITH DIFFERENTIAL/PLATELET  COMPREHENSIVE METABOLIC PANEL  URINALYSIS, ROUTINE W REFLEX MICROSCOPIC  MAGNESIUM  TROPONIN I (HIGH SENSITIVITY)    EKG None  Radiology No results found.  Procedures .Critical Care Performed by: Margarita Mail, PA-C Authorized by: Margarita Mail, PA-C   Critical care provider statement:    Critical care time (minutes):  56   Critical care time was exclusive of:  Separately billable procedures and treating other patients   Critical care was necessary to treat or prevent imminent or life-threatening deterioration of the following conditions:  Renal failure and dehydration   Critical care was time spent personally by me on the following activities:  Discussions with consultants, evaluation of patient's response to treatment, examination of patient, ordering and performing treatments and interventions, ordering and review of laboratory studies, ordering and review of radiographic studies, pulse oximetry, re-evaluation of patient's condition, obtaining history from patient or surrogate and review of old charts   Medications Ordered in ED Medications  sodium chloride 0.9 % bolus 1,000 mL (has no administration in time range)  metoCLOPramide (REGLAN) injection 10 mg (has no administration in time range)  diphenhydrAMINE (BENADRYL) injection 12.5 mg (has no administration in time range)    ED Course  I have reviewed the  triage vital signs and the nursing notes.  Pertinent labs & imaging results that were available during my care of the patient were reviewed by me and considered in my medical decision making (see chart for details).    MDM Rules/Calculators/A&P 55 year old female who presents emergency department with 2 weeks of intractable vomiting.  Patient is actively vomiting upon initial evaluation family reports that she has had to bring buckets wherever she goes due to nonstop vomiting.  This appears to be a recurrent issue for the patient. The emergent differential diagnosis for vomiting includes, but is not limited to ACS/MI, Boerhaave's, DKA, Intracranial Hemorrhage, Ischemic bowel, Meningitis, Sepsis, Acute gastric dilation, Acetaminophen toxicity, Adrenal insufficiency, Appendicitis, Aspirin toxicity, Bowel obstruction/ileus, Carbon monoxide poisoning, Cholecystitis, CNS tumor. Digoxin toxicity, Electrolyte abnormalities, Elevated ICP, Gastric outlet obstruction, Hyperemesis gravidarum, Pancreatitis, Peritonitis,  Ruptured viscus, Biliary colic, Cannabinoid hyperemesis syndrome, Chemotherapy, Disulfiram effect, Erythromycin, ETOH, Gastritis, Gastroenteritis, Gastroparesis, Hepatitis, Labyrinthitis, Migraine, Motion sickness, Narcotic withdrawal,  Peptic ulcer disease, Renal colic, and UTI I ordered and reviewed labs that included CBC with elevated white blood cell count of 18.5 thousand with neutrophilia CMP with marked high pole kalemia, elevated bicarb level, acute kidney injury with near doubling of previous creatinine, elevated BUN of 56, blood glucose of 153 and anion gap acidosis likely due to to multifactorial issues including uremia, and elevated lactic acid level. PT/INR and troponin within normal limits, UA may be suggestive of infection.  Magnesium level is low at 1.2.  I ordered a CT abdomen and pelvis which shows a dysplastic and Functional right kidney which appears to be chronic.  Stable left  adrenal mass, cholelithiasis with gallbladder sludge without evidence of cholecystitis.   I was able to control patient's nausea and vomiting with and Reglan.   Patient with severe nausea and vomiting, AKI, hypokalemia, hypomagnesemia and anion gap mixed metabolic acidosis.  Patient given fluids, multiple rounds of IV potassium and magnesium.  She will need admission for severe dehydration and vomiting.  Case discussed with [facility] who will admit the patient.  Final Clinical Impression(s) / ED Diagnoses Final diagnoses:  None    Rx / DC Orders ED Discharge Orders     None        Margarita Mail, PA-C 08/07/21 [place], [place], DO 08/07/21 2003

## 2021-08-06 NOTE — H&P (Addendum)
History and Physical    Courtney Dyer [id number] DOB: [birth year] DOA: 08/06/2021  PCP: Pcp, No   Patient coming from: Home  I have personally briefly reviewed patient's old medical records in [place]  Chief Complaint: Vomiting  HPI: Courtney Dyer is a 55 y.o. female with medical history significant for anemia.  Patient presented to the ED with complaints of nausea and vomiting of 2 weeks duration.  She reports multiple episodes every day.  She reports generalized weakness also with very poor oral intake.  No diarrhea.  She also mild diffuse abdominal pain.  She lives with her aunt.  No other family members with similar symptoms. Patient also has a rash underneath her breasts and abdominal pannus has been present for several days to weeks.  ED Course: Temperature 98.4.  Tachycardic heart rate 100-110.  Respiratory rate 18-26.  Initially soft blood pressure systolic 92 improved with fluids currently 107.  WBC 18.5.  Potassium 2.5.  Magnesium 1.2.  Creatinine elevated at 5.3.  Troponin 9x2.  Lactic acid 2.3.  Portable chest x-ray without acute abnormality.  CT abdomen and pelvis without contrast-right kidney severely dysplastic and nonfunctional, no hydronephrosis or hydroureter.  Stable left adrenal mass. Fluid bolus 2.5 L given.  Electrolyte supplementation started.  Review of Systems: As per HPI all other systems reviewed and negative.  Past Medical History:  Diagnosis Date   Diabetes mellitus    Gout    High cholesterol     Past Surgical History:  Procedure Laterality Date   BREAST SURGERY     NEPHRECTOMY       reports that she has been smoking cigarettes. She has been smoking an average of .5 packs per day. She has never used smokeless tobacco. She reports current alcohol use. She reports that she does not use drugs.  No Known Allergies  Family History  Problem Relation Age of Onset   Hypertension Maternal Aunt     Prior to Admission medications    Medication Sig Start Date End Date Taking? Authorizing Provider  lisinopril (ZESTRIL) 10 MG tablet Take 1 tablet (10 mg total) by mouth daily. 04/28/21 08/06/21 Yes Shah, Pratik D, DO  ondansetron (ZOFRAN) 4 MG tablet Take 1 tablet (4 mg total) by mouth every 6 (six) hours as needed for nausea. Patient not taking: No sig reported 04/27/21   Heath Lark D, DO    Physical Exam: Vitals:   08/06/21 1224 08/06/21 1225 08/06/21 1230 08/06/21 1300  BP:  107/61 90/76 100/66  Pulse: (!) 107 (!) 103 (!) 103 100  Resp: '19 17 19 15  '$ Temp:      TempSrc:      SpO2: 100% 99% 98% 100%  Weight:      Height:        Constitutional: Poor hygiene, appears very dehydrated, calm, comfortable Vitals:   08/06/21 1224 08/06/21 1225 08/06/21 1230 08/06/21 1300  BP:  107/61 90/76 100/66  Pulse: (!) 107 (!) 103 (!) 103 100  Resp: '19 17 19 15  '$ Temp:      TempSrc:      SpO2: 100% 99% 98% 100%  Weight:      Height:       Eyes: PERRL, lids and conjunctivae normal ENMT: Mucous membranes are very dry Neck: normal, supple, no masses, no thyromegaly Respiratory: clear to auscultation bilaterally, no wheezing, no crackles. Normal respiratory effort. No accessory muscle use.  Cardiovascular: Tachycardic, regular rate and rhythm, no murmurs / rubs / gallops.  No extremity edema. 2+ pedal pulses.  Abdomen: no tenderness, no masses palpated. No hepatosplenomegaly. Bowel sounds positive.  Musculoskeletal: no clubbing / cyanosis. No joint deformity upper and lower extremities. Good ROM, no contractures.   Skin: Large areas of excoriations to underneath bilateral breasts and beneath abdominal pannus, with discharge, with obvious poor hygiene and unpleasant odor Neurologic: No apparent cranial abnormality, moving extremities Psychiatric: Normal judgment and insight. Alert and oriented x 3. Normal mood.   Labs on Admission: I have personally reviewed following labs and imaging studies  CBC: Recent Labs  Lab  08/06/21 1204  WBC 18.5*  NEUTROABS 16.6*  HGB 9.9*  HCT 30.8*  MCV 83.5  PLT [id number]   Basic Metabolic Panel: Recent Labs  Lab 08/06/21 1204  NA 135  K 2.5*  CL 79*  CO2 34*  GLUCOSE 153*  BUN 56*  CREATININE 5.30*  CALCIUM 6.6*  MG 1.2*   Liver Function Tests: Recent Labs  Lab 08/06/21 1204  AST 15  ALT 8  ALKPHOS 76  BILITOT 1.5*  PROT 7.7  ALBUMIN 2.9*   Coagulation Profile: Recent Labs  Lab 08/06/21 1354  INR 1.1   CBG: Recent Labs  Lab 08/06/21 1233  GLUCAP 142*    Radiological Exams on Admission: CT ABDOMEN PELVIS WO CONTRAST  Result Date: 08/06/2021 CLINICAL DATA:  Nausea and vomiting. Weakness. Poor food intake. Stage Ali elevated creatinine on today's labs. EXAM: CT ABDOMEN AND PELVIS WITHOUT CONTRAST TECHNIQUE: Multidetector CT imaging of the abdomen and pelvis was performed following the standard protocol without IV contrast. COMPARISON:  04/23/2021 FINDINGS: Lower chest: Linear subsegmental atelectasis or scarring in the right lower lobe. Hepatobiliary: Faint dependent heterogeneity in the gallbladder, potentially from sludge or gallstones. No gallbladder wall thickening or pericholecystic fluid. The liver appears otherwise unremarkable. Pancreas: Unremarkable Spleen: Unremarkable Adrenals/Urinary Tract: 1.9 by 2.5 cm left adrenal mass with internal density 16 Hounsfield units, indeterminate. The right adrenal gland appears normal. There is only a small nodule of tissue at the expected location of the right kidney, compatible with dysplastic kidney. Urinary bladder unremarkable. Retained fetal lobulation of the left kidney. No hydronephrosis or hydroureter. Stomach/Bowel: Fatty deposition in the wall of the cecum which is typically an incidental normal variant although has a weak association with inflammatory bowel disease. Currently the terminal ileum appears grossly unremarkable. Normal appendix. The colon is relatively empty which correlates with the  patient's history of reduced oral intake. There some scattered diverticula of the descending and proximal sigmoid colon without findings of active diverticulitis. No dilated bowel. Vascular/Lymphatic: Unremarkable Reproductive: Unremarkable Other: No supplemental non-categorized findings. Musculoskeletal: Stable mild SI joint arthropathy with suspected erosions along the SI joints. Mild to moderate lumbar spondylosis and degenerative disc disease. Small umbilical hernia contains adipose tissue. Bridging spurring in the lower thoracic spine. IMPRESSION: 1. A specific cause for the patient's symptoms is not identified. There is currently no left hydronephrosis or hydroureter to correlate with the patient's recent elevated creatinine. 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 2. Stable left adrenal mass, with nonspecific internal density. 3. Sludge versus gallstones in the gallbladder. No pericholecystic fluid or gallbladder wall thickening. 4. Suspected chronic erosions along the SI joints compatible with bilateral sacroiliitis. Electronically Signed   By: Van Clines M.D.   On: 08/06/2021 14:58   DG Chest Port 1 View  Result Date: 08/06/2021 CLINICAL DATA:  Concern for sepsis EXAM: PORTABLE CHEST 1 VIEW COMPARISON:  Chest x-ray dated November 26, 2015 FINDINGS: Cardiac and  mediastinal contours are within normal limits. Bibasilar atelectasis. Lungs otherwise clear. No evidence of pleural effusion or pneumothorax. IMPRESSION: No active disease. Electronically Signed   By: Yetta Glassman M.D.   On: 08/06/2021 13:45    EKG: Independently reviewed.  Sinus tachycardia rate 122.  QTc 517.  Nonspecific T wave abnormalities are old and unchanged from prior EKG.  Assessment/Plan Principal Problem:   AKI (acute kidney injury) ([place]) Active Problems:   Chronic anemia   Hypokalemia   Hypomagnesemia   Candidal intertrigo   Adrenal mass ([facility])   Acute kidney injury-creatinine 5.3, last  creatinine on file at the time last hospitalization in June was 1.7.  Unknown baseline.  Likely prerenal from vomiting of 2 weeks, poor oral intake.  She is on low dose lisinopril -10 mg. - 2.5 L bolus given, continue N/s @ 100cc/hr x 1 day -Hold home lisinopril  Acute gastroenteritis-nausea, vomiting.  Similar presentation in June/2022.  Abdominal CT unrevealing.  Leukocytosis of 18.5.  No diarrhea.  Abdominal exam benign.  Lactic acidosis 2.3 likely from dehydration. -Trend CBC -IV fluids -Phenergan as needed -Trend lactic acid -Bowel rest with clear liquid diet, advance as tolerated   Candidal intertrigo-extensive area beneath bilateral breasts, groins, underneath abdominal pannus.  Likely from poor hygiene. -Topical nystatin 3 times daily -Considering extensive area of involvement, would likely benefit from Diflucan, but QTC is prolonged at this time, so diflucan deferred for now  Hypokalemia, hypomagnesemia-potassium 2.5, magnesium 1.2. - Replete   Prolonged QTC-517.  Likely from electrolyte abnormalities hypokalemia and hypomagnesemia. -Check EKG in the morning  Chronic anemia-hemoglobin 9.9, appears to be at baseline. -Anemia panel in the morning  Hypertension-systolic 0000000 to [id number]. -Hold lisinopril  Adrenal mass- per CT stable.  DVT prophylaxis: Heparin Code Status: Full code Family Communication: None at bedside disposition Disposition Plan: >/~Patient doing 2days Consults called: None Admission status: Inpt, tele I certify that at the point of admission it is my clinical judgment that the patient will require inpatient hospital care spanning beyond 2 midnights from the point of admission due to high intensity of service, high risk for further deterioration and high frequency of surveillance required.   Bethena Roys MD [facility]  08/06/2021, 5:18 PM

## 2021-08-06 NOTE — ED Notes (Signed)
Nurse attempted to call pt's aunt for update

## 2021-08-06 NOTE — ED Notes (Signed)
Date and time results received: 08/06/21' \\2'$ :30 PM  Test: lactic acid Critical Value: 2.3  Name of Provider Notified: abigail,PA  Orders Received? Or Actions Taken?: PA notified

## 2021-08-06 NOTE — ED Triage Notes (Signed)
Per pt has not eaten in 2 weeks; Nausea and vomiting for 2 weeks; increase weakness

## 2021-08-07 ENCOUNTER — Encounter (HOSPITAL_COMMUNITY): Payer: Self-pay | Admitting: Internal Medicine

## 2021-08-07 LAB — CBC
HCT: 24.3 % — ABNORMAL LOW (ref 36.0–46.0)
Hemoglobin: 7.6 g/dL — ABNORMAL LOW (ref 12.0–15.0)
MCH: 26.4 pg (ref 26.0–34.0)
MCHC: 31.3 g/dL (ref 30.0–36.0)
MCV: 84.4 fL (ref 80.0–100.0)
Platelets: 182 10*3/uL (ref 150–400)
RBC: 2.88 MIL/uL — ABNORMAL LOW (ref 3.87–5.11)
RDW: 15 % (ref 11.5–15.5)
WBC: 11.9 10*3/uL — ABNORMAL HIGH (ref 4.0–10.5)
nRBC: 0 % (ref 0.0–0.2)

## 2021-08-07 LAB — IRON AND TIBC
Iron: 52 ug/dL (ref 28–170)
Saturation Ratios: 50 % — ABNORMAL HIGH (ref 10.4–31.8)
TIBC: 105 ug/dL — ABNORMAL LOW (ref 250–450)
UIBC: 53 ug/dL

## 2021-08-07 LAB — FOLATE: Folate: 5.6 ng/mL — ABNORMAL LOW (ref >5.9)

## 2021-08-07 LAB — FERRITIN: Ferritin: 265 ng/mL (ref 11–307)

## 2021-08-07 LAB — BASIC METABOLIC PANEL
Anion gap: 16 — ABNORMAL HIGH (ref 5–15)
BUN: 49 mg/dL — ABNORMAL HIGH (ref 6–20)
CO2: 28 mmol/L (ref 22–32)
Calcium: 6.9 mg/dL — ABNORMAL LOW (ref 8.9–10.3)
Chloride: 92 mmol/L — ABNORMAL LOW (ref 98–111)
Creatinine, Ser: 3.79 mg/dL — ABNORMAL HIGH (ref 0.44–1.00)
GFR, Estimated: 14 mL/min — ABNORMAL LOW (ref >60)
Glucose, Bld: 116 mg/dL — ABNORMAL HIGH (ref 70–99)
Potassium: 2.8 mmol/L — ABNORMAL LOW (ref 3.5–5.1)
Sodium: 136 mmol/L (ref 135–145)

## 2021-08-07 LAB — RETICULOCYTES
Immature Retic Fract: 14.5 % (ref 2.3–15.9)
RBC.: 2.91 MIL/uL — ABNORMAL LOW (ref 3.87–5.11)
Retic Count, Absolute: 27.1 10*3/uL (ref 19.0–186.0)
Retic Ct Pct: 0.9 % (ref 0.4–3.1)

## 2021-08-07 LAB — VITAMIN B12: Vitamin B-12: 1508 pg/mL — ABNORMAL HIGH (ref 180–914)

## 2021-08-07 LAB — MAGNESIUM: Magnesium: 2.6 mg/dL — ABNORMAL HIGH (ref 1.7–2.4)

## 2021-08-07 LAB — PHOSPHORUS: Phosphorus: 3.5 mg/dL (ref 2.5–4.6)

## 2021-08-07 MED ORDER — TRAZODONE HCL 50 MG PO TABS: 50.0000 mg | ORAL_TABLET | Freq: Every evening | ORAL | Status: DC | PRN

## 2021-08-07 MED ORDER — POTASSIUM CHLORIDE CRYS ER 20 MEQ PO TBCR
40.0000 meq | EXTENDED_RELEASE_TABLET | Freq: Once | ORAL | Status: AC
  Administered 2021-08-07: 40 meq via ORAL
  Filled 2021-08-07: qty 2

## 2021-08-07 MED ORDER — POTASSIUM CHLORIDE 10 MEQ/100ML IV SOLN
10.0000 meq | INTRAVENOUS | Status: AC
Start: 2021-08-07 — End: 2021-08-07
  Administered 2021-08-07 (×4): 10 meq via INTRAVENOUS
  Filled 2021-08-07 (×4): qty 100

## 2021-08-07 MED ORDER — FOLIC ACID 1 MG PO TABS
1.0000 mg | ORAL_TABLET | Freq: Every day | ORAL | Status: DC
  Administered 2021-08-07 – 2021-08-09 (×3): 1 mg via ORAL
  Filled 2021-08-07 (×3): qty 1

## 2021-08-07 NOTE — Evaluation (Signed)
Physical Therapy Evaluation Patient Details Name: Courtney Dyer MRN: [id number] DOB: Sep 09, 1966 Today's Date: 08/07/2021  History of Present Illness  Courtney Dyer is a 55 y.o. female with medical history significant for anemia.  Patient presented to the ED with complaints of nausea and vomiting of 2 weeks duration.  She reports multiple episodes every day.  She reports generalized weakness also with very poor oral intake.  No diarrhea.  She also mild diffuse abdominal pain.  She lives with her aunt.  No other family members with similar symptoms.  Patient also has a rash underneath her breasts and abdominal pannus has been present for several days to weeks.   Clinical Impression  Patient functioning near baseline for functional mobility and gait and limited mostly due to c/o pain in legs when standing demonstrating slow labored unsteady cadence using RW without loss of balance, attempted use of SPC, but very unsteady and safer using RW.  Patient requested to go back to bed after therapy due to fatigue.  Patient will benefit from continued physical therapy in hospital and recommended venue below to increase strength, balance, endurance for safe ADLs and gait.         Recommendations for follow up therapy are one component of a multi-disciplinary discharge planning process, led by the attending physician.  Recommendations may be updated based on patient status, additional functional criteria and insurance authorization.  Follow Up Recommendations Home health PT;Supervision for mobility/OOB;Supervision - Intermittent    Equipment Recommendations  None recommended by PT    Recommendations for Other Services       Precautions / Restrictions Precautions Precautions: Fall Restrictions Weight Bearing Restrictions: No      Mobility  Bed Mobility Overal bed mobility: Modified Independent             General bed mobility comments: requires HOB raisde due to not able to tolerate  lying flat    Transfers Overall transfer level: Needs assistance Equipment used: Rolling walker (2 wheeled) Transfers: Sit to/from [facility] Sit to Stand: Supervision Stand pivot transfers: Supervision       General transfer comment: slow labored movement  Ambulation/Gait Ambulation/Gait assistance: Supervision;Min guard Gait Distance (Feet): 20 Feet Assistive device: Rolling walker (2 wheeled);Straight cane Gait Pattern/deviations: Decreased step length - right;Decreased step length - left;Decreased stride length Gait velocity: decreased   General Gait Details: unsteady labored steps using SPC, improved balance using RW demonstrating slow labored cadence without loss of balance  Stairs            Wheelchair Mobility    Modified Rankin (Stroke Patients Only)       Balance Overall balance assessment: Needs assistance Sitting-balance support: Feet supported;No upper extremity supported Sitting balance-Leahy Scale: Good Sitting balance - Comments: seated at EOB   Standing balance support: During functional activity;Single extremity supported Standing balance-Leahy Scale: Poor Standing balance comment: fair/poor using SPC, fair using RW                             Pertinent Vitals/Pain Pain Assessment: Faces Faces Pain Scale: Hurts little more Pain Location: BLE mostly calves Pain Descriptors / Indicators: Sore;Discomfort Pain Intervention(s): Limited activity within patient's tolerance;Monitored during session;Repositioned    Home Living Family/patient expects to be discharged to:: Private residence Living Arrangements: Other relatives Available Help at Discharge: Available 24 hours/day Type of Home: House Home Access: Stairs to enter Entrance Stairs-Rails: None Entrance Stairs-Number of Steps: 1 Home  Layout: One level Home Equipment: Cane - single point;Walker - 2 wheels      Prior Function Level of Independence:  Independent with assistive device(s)         Comments: Household and short distanced community ambulator using tri-pod Mining engineer Dominance        Extremity/Trunk Assessment   Upper Extremity Assessment Upper Extremity Assessment: Defer to OT evaluation    Lower Extremity Assessment Lower Extremity Assessment: Generalized weakness    Cervical / Trunk Assessment Cervical / Trunk Assessment: Normal  Communication   Communication: No difficulties  Cognition Arousal/Alertness: Awake/alert Behavior During Therapy: WFL for tasks assessed/performed Overall Cognitive Status: Within Functional Limits for tasks assessed                                        General Comments      Exercises     Assessment/Plan    PT Assessment Patient needs continued PT services  PT Problem List Decreased strength;Decreased activity tolerance;Decreased balance;Decreased mobility       PT Treatment Interventions DME instruction;Gait training;Stair training;Functional mobility training;Therapeutic activities;Therapeutic exercise;Balance training;Patient/family education    PT Goals (Current goals can be found in the Care Plan section)  Acute Rehab PT Goals Patient Stated Goal: return home with family to assist PT Goal Formulation: With patient Time For Goal Achievement: 08/10/21 Potential to Achieve Goals: Good    Frequency Min 3X/week   Barriers to discharge        Co-evaluation               AM-PAC PT "6 Clicks" Mobility  Outcome Measure Help needed turning from your back to your side while in a flat bed without using bedrails?: None Help needed moving from lying on your back to sitting on the side of a flat bed without using bedrails?: A Little Help needed moving to and from a bed to a chair (including a wheelchair)?: A Little Help needed standing up from a chair using your arms (e.g., wheelchair or bedside chair)?: A Little Help needed to walk in  hospital room?: A Little Help needed climbing 3-5 steps with a railing? : A Lot 6 Click Score: 18    End of Session   Activity Tolerance: Patient tolerated treatment well;Patient limited by fatigue;Patient limited by pain Patient left: in bed;with call bell/phone within reach Nurse Communication: Mobility status PT Visit Diagnosis: Unsteadiness on feet (R26.81);Other abnormalities of gait and mobility (R26.89);Muscle weakness (generalized) (M62.81)    Time: [id number] PT Time Calculation (min) (ACUTE ONLY): 27 min   Charges:   PT Evaluation $PT Eval Moderate Complexity: 1 Mod PT Treatments $Therapeutic Activity: 23-37 mins        3:13 PM, 08/07/21 Lonell Grandchild, MPT Physical Therapist with [facility] [phone] office [phone] mobile phone

## 2021-08-07 NOTE — Plan of Care (Signed)
  Problem: Acute Rehab PT Goals(only PT should resolve) Goal: Pt Will Go Supine/Side To Sit Outcome: Progressing Flowsheets (Taken 08/07/2021 1515) Pt will go Supine/Side to Sit:  with modified independence  Independently Goal: Patient Will Transfer Sit To/From Stand Outcome: Progressing Flowsheets (Taken 08/07/2021 1515) Patient will transfer sit to/from stand:  with modified independence  with supervision Goal: Pt Will Transfer Bed To Chair/Chair To Bed Outcome: Progressing Flowsheets (Taken 08/07/2021 1515) Pt will Transfer Bed to Chair/Chair to Bed:  with modified independence  with supervision Goal: Pt Will Ambulate Outcome: Progressing Flowsheets (Taken 08/07/2021 1515) Pt will Ambulate:  75 feet  with modified independence  with supervision  with rolling walker   3:16 PM, 08/07/21 Lonell Grandchild, MPT Physical Therapist with [facility] [phone] office [phone] mobile phone

## 2021-08-07 NOTE — Progress Notes (Signed)
PROGRESS NOTE    Courtney Dyer  [id number] DOB: 1966/01/15 DOA: 08/06/2021 PCP: Pcp, No   Brief Narrative:  55 year old with history of anemia admitted to the hospital for nausea vomiting ongoing for about 2 weeks.  Found to be dehydrated with acute kidney injury creatinine of 5.3, baseline 1.7.  She was started on IV fluids.  She also had hypokalemia.  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   Assessment & Plan:   Principal Problem:   AKI (acute kidney injury) ([place]) Active Problems:   Hypokalemia   Chronic anemia   Hypomagnesemia   Candidal intertrigo   Adrenal mass ([facility])  Nausea vomiting, suspect gastritis - This is improved.  Diet as tolerated.  CT abdomen pelvis does not show anything acute besides severely dysplastic right kidney.  Supportive care.  Acute kidney injury - Baseline creatinine 1.7, admission creatinine 5.3 which is improving.  Continue aggressive IV fluids.  Hypokalemia - Repletion.  Check magnesium and phosphorus  Candida intertrigo - Topical nystatin  Anemia of chronic disease - Baseline hemoglobin 9.9  Essential hypertension - Lisinopril on hold in setting of AKI.  IV hydralazine as needed  Adrenal mass - Stable.  Follow-up outpatient     DVT prophylaxis: Subcu heparin Code Status: Full code Family Communication:    Status is: Inpatient  Remains inpatient appropriate because:Inpatient level of care appropriate due to severity of illness.  Renal function still elevated, continue IV fluids.  Dispo: The patient is from: Home              Anticipated d/c is to: Home              Patient currently is not medically stable to d/c.   Difficult to place patient No      Subjective: Patient tells me her nausea vomiting is little better this morning.  But over past few days she has had poor oral intake and dark urine.  She lives at home with her family and ambulates  independently but occasionally uses cane.  She has felt very weak in last few days  Review of Systems Otherwise negative except as per HPI, including: General: Denies fever, chills, night sweats or unintended weight loss. Resp: Denies cough, wheezing, shortness of breath. Cardiac: Denies chest pain, palpitations, orthopnea, paroxysmal nocturnal dyspnea. GI: Denies abdominal pain, nausea, vomiting, diarrhea or constipation GU: Denies dysuria, frequency, hesitancy or incontinence MS: Denies muscle aches, joint pain or swelling Neuro: Denies headache, neurologic deficits (focal weakness, numbness, tingling), abnormal gait Psych: Denies anxiety, depression, SI/HI/AVH Skin: Denies new rashes or lesions ID: Denies sick contacts, exotic exposures, travel  Examination:  General exam: Appears calm and comfortable, dry mouth Respiratory system: Clear to auscultation. Respiratory effort normal. Cardiovascular system: S1 & S2 heard, RRR. No JVD, murmurs, rubs, gallops or clicks. No pedal edema. Gastrointestinal system: Abdomen is nondistended, soft and nontender. No organomegaly or masses felt. Normal bowel sounds heard. Central nervous system: Alert and oriented. No focal neurological deficits. Extremities: Symmetric 5 x 5 power. Skin: Erythema noted under her pannus area, likely fungal. Psychiatry: Judgement and insight appear normal. Mood & affect appropriate.     Objective: Vitals:   08/06/21 2027 08/07/21 0043 08/07/21 0535 08/07/21 0741  BP: 101/62 97/67 113/79 126/76  Pulse: 93 87 90 78  Resp: '18 18 20 16  '$ Temp: 97.6 F (36.4 C) 98.1 F (36.7 C) 97.8 F (36.6 C) 97.8 F (36.6 C)  TempSrc: Oral  Oral Oral Oral  SpO2: 100% 100% 100% 100%  Weight:      Height:        Intake/Output Summary (Last 24 hours) at 08/07/2021 1235 Last data filed at 08/07/2021 0900 Gross per 24 hour  Intake 360 ml  Output --  Net 360 ml   Filed Weights   08/06/21 1109 08/06/21 1749  Weight: 77.1 kg  77.1 kg     Data Reviewed:   CBC: Recent Labs  Lab 08/06/21 1204 08/07/21 0559  WBC 18.5* 11.9*  NEUTROABS 16.6*  --   HGB 9.9* 7.6*  HCT 30.8* 24.3*  MCV 83.5 84.4  PLT 247 [id number]   Basic Metabolic Panel: Recent Labs  Lab 08/06/21 1204 08/06/21 2142 08/07/21 0559  NA 135  --  136  K 2.5* 3.3* 2.8*  CL 79*  --  92*  CO2 34*  --  28  GLUCOSE 153*  --  116*  BUN 56*  --  49*  CREATININE 5.30*  --  3.79*  CALCIUM 6.6*  --  6.9*  MG 1.2*  --  2.6*  PHOS  --   --  3.5   GFR: Estimated Creatinine Clearance: 16.5 mL/min (A) (by C-G formula based on SCr of 3.79 mg/dL (H)). Liver Function Tests: Recent Labs  Lab 08/06/21 1204  AST 15  ALT 8  ALKPHOS 76  BILITOT 1.5*  PROT 7.7  ALBUMIN 2.9*   No results for input(s): LIPASE, AMYLASE in the last 168 hours. No results for input(s): AMMONIA in the last 168 hours. Coagulation Profile: Recent Labs  Lab 08/06/21 1354  INR 1.1   Cardiac Enzymes: No results for input(s): CKTOTAL, CKMB, CKMBINDEX, TROPONINI in the last 168 hours. BNP (last 3 results) No results for input(s): PROBNP in the last 8760 hours. HbA1C: No results for input(s): HGBA1C in the last 72 hours. CBG: Recent Labs  Lab 08/06/21 1233  GLUCAP 142*   Lipid Profile: No results for input(s): CHOL, HDL, LDLCALC, TRIG, CHOLHDL, LDLDIRECT in the last 72 hours. Thyroid Function Tests: No results for input(s): TSH, T4TOTAL, FREET4, T3FREE, THYROIDAB in the last 72 hours. Anemia Panel: Recent Labs    08/07/21 0559  VITAMINB12 1,508*  FOLATE 5.6*  FERRITIN 265  TIBC 105*  IRON 52  RETICCTPCT 0.9   Sepsis Labs: Recent Labs  Lab 08/06/21 1354 08/06/21 1704 08/06/21 1833  LATICACIDVEN 2.3* 2.3* 2.1*    Recent Results (from the past 240 hour(s))  Blood culture (routine single)     Status: None (Preliminary result)   Collection Time: 08/06/21  1:54 PM   Specimen: BLOOD RIGHT HAND  Result Value Ref Range Status   Specimen Description BLOOD  RIGHT HAND  Final   Special Requests   Final    Blood Culture results may not be optimal due to an inadequate volume of blood received in culture bottles BOTTLES DRAWN AEROBIC AND ANAEROBIC   Culture   Final    NO GROWTH < 24 HOURS Performed at [facility], [address] [place], [place] [address]    Report Status PENDING  Incomplete  Resp Panel by RT-PCR (Flu A&B, Covid) Nasopharyngeal Swab     Status: None   Collection Time: 08/06/21  2:26 PM   Specimen: Nasopharyngeal Swab; Nasopharyngeal(NP) swabs in vial transport medium  Result Value Ref Range Status   SARS Coronavirus 2 by RT PCR NEGATIVE NEGATIVE Final    Comment: (NOTE) SARS-CoV-2 target nucleic acids are NOT DETECTED.  The SARS-CoV-2 RNA is  generally detectable in upper respiratory specimens during the acute phase of infection. The lowest concentration of SARS-CoV-2 viral copies this assay can detect is 138 copies/mL. A negative result does not preclude SARS-Cov-2 infection and should not be used as the sole basis for treatment or other patient management decisions. A negative result may occur with  improper specimen collection/handling, submission of specimen other than nasopharyngeal swab, presence of viral mutation(s) within the areas targeted by this assay, and inadequate number of viral copies(<138 copies/mL). A negative result must be combined with clinical observations, patient history, and epidemiological information. The expected result is Negative.  Fact Sheet for Patients:  EntrepreneurPulse.com.au  Fact Sheet for Healthcare Providers:  IncredibleEmployment.be  This test is no t yet approved or cleared by the [place] FDA and  has been authorized for detection and/or diagnosis of SARS-CoV-2 by FDA under an Emergency Use Authorization (EUA). This EUA will remain  in effect (meaning this test can be used) for the duration of the COVID-19 declaration under Section  564(b)(1) of the Act, 21 U.S.C.section 360bbb-3(b)(1), unless the authorization is terminated  or revoked sooner.       Influenza A by PCR NEGATIVE NEGATIVE Final   Influenza B by PCR NEGATIVE NEGATIVE Final    Comment: (NOTE) The Xpert Xpress SARS-CoV-2/FLU/RSV plus assay is intended as an aid in the diagnosis of influenza from Nasopharyngeal swab specimens and should not be used as a sole basis for treatment. Nasal washings and aspirates are unacceptable for Xpert Xpress SARS-CoV-2/FLU/RSV testing.  Fact Sheet for Patients: EntrepreneurPulse.com.au  Fact Sheet for Healthcare Providers: IncredibleEmployment.be  This test is not yet approved or cleared by the [place] FDA and has been authorized for detection and/or diagnosis of SARS-CoV-2 by FDA under an Emergency Use Authorization (EUA). This EUA will remain in effect (meaning this test can be used) for the duration of the COVID-19 declaration under Section 564(b)(1) of the Act, 21 U.S.C. section 360bbb-3(b)(1), unless the authorization is terminated or revoked.  Performed at [facility], [address], [place],  [address]          Radiology Studies: CT ABDOMEN PELVIS WO CONTRAST  Result Date: 08/06/2021 CLINICAL DATA:  Nausea and vomiting. Weakness. Poor food intake. Stage Ali elevated creatinine on today's labs. EXAM: CT ABDOMEN AND PELVIS WITHOUT CONTRAST TECHNIQUE: Multidetector CT imaging of the abdomen and pelvis was performed following the standard protocol without IV contrast. COMPARISON:  04/23/2021 FINDINGS: Lower chest: Linear subsegmental atelectasis or scarring in the right lower lobe. Hepatobiliary: Faint dependent heterogeneity in the gallbladder, potentially from sludge or gallstones. No gallbladder wall thickening or pericholecystic fluid. The liver appears otherwise unremarkable. Pancreas: Unremarkable Spleen: Unremarkable Adrenals/Urinary Tract: 1.9 by 2.5 cm  left adrenal mass with internal density 16 Hounsfield units, indeterminate. The right adrenal gland appears normal. There is only a small nodule of tissue at the expected location of the right kidney, compatible with dysplastic kidney. Urinary bladder unremarkable. Retained fetal lobulation of the left kidney. No hydronephrosis or hydroureter. Stomach/Bowel: Fatty deposition in the wall of the cecum which is typically an incidental normal variant although has a weak association with inflammatory bowel disease. Currently the terminal ileum appears grossly unremarkable. Normal appendix. The colon is relatively empty which correlates with the patient's history of reduced oral intake. There some scattered diverticula of the descending and proximal sigmoid colon without findings of active diverticulitis. No dilated bowel. Vascular/Lymphatic: Unremarkable Reproductive: Unremarkable Other: No supplemental non-categorized findings. Musculoskeletal: Stable mild SI joint arthropathy  with suspected erosions along the SI joints. Mild to moderate lumbar spondylosis and degenerative disc disease. Small umbilical hernia contains adipose tissue. Bridging spurring in the lower thoracic spine. IMPRESSION: 1. A specific cause for the patient's symptoms is not identified. There is currently no left hydronephrosis or hydroureter to correlate with the patient's recent elevated creatinine. 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 2. Stable left adrenal mass, with nonspecific internal density. 3. Sludge versus gallstones in the gallbladder. No pericholecystic fluid or gallbladder wall thickening. 4. Suspected chronic erosions along the SI joints compatible with bilateral sacroiliitis. Electronically Signed   By: Van Clines M.D.   On: 08/06/2021 14:58   DG Chest Port 1 View  Result Date: 08/06/2021 CLINICAL DATA:  Concern for sepsis EXAM: PORTABLE CHEST 1 VIEW COMPARISON:  Chest x-ray dated November 26, 2015 FINDINGS: Cardiac and mediastinal contours are within normal limits. Bibasilar atelectasis. Lungs otherwise clear. No evidence of pleural effusion or pneumothorax. IMPRESSION: No active disease. Electronically Signed   By: Yetta Glassman M.D.   On: 08/06/2021 13:45        Scheduled Meds:  folic acid  1 mg Oral Daily   heparin  5,000 Units Subcutaneous Q8H   nystatin   Topical TID   Continuous Infusions:  sodium chloride 100 mL/hr at 08/06/21 2125   potassium chloride 10 mEq (08/07/21 1149)     LOS: 1 day   Time spent= 35 mins    Elvy Mclarty Arsenio Loader, MD [facility]  If 7PM-7AM, please contact night-coverage  08/07/2021, 12:35 PM

## 2021-08-07 NOTE — Care Management Important Message (Signed)
Important Message  Patient Details  Name: Courtney Dyer MRN: [id number] Date of Birth: 11/11/1966   Medicare Important Message Given:  Yes     Tommy Medal 08/07/2021, 12:03 PM

## 2021-08-08 DIAGNOSIS — E278 Other specified disorders of adrenal gland: Secondary | ICD-10-CM

## 2021-08-08 DIAGNOSIS — N183 Chronic kidney disease, stage 3 unspecified: Secondary | ICD-10-CM

## 2021-08-08 DIAGNOSIS — N1832 Chronic kidney disease, stage 3b: Secondary | ICD-10-CM

## 2021-08-08 LAB — BASIC METABOLIC PANEL
Anion gap: 12 (ref 5–15)
BUN: 39 mg/dL — ABNORMAL HIGH (ref 6–20)
CO2: 28 mmol/L (ref 22–32)
Calcium: 8.4 mg/dL — ABNORMAL LOW (ref 8.9–10.3)
Chloride: 96 mmol/L — ABNORMAL LOW (ref 98–111)
Creatinine, Ser: 2.75 mg/dL — ABNORMAL HIGH (ref 0.44–1.00)
GFR, Estimated: 20 mL/min — ABNORMAL LOW (ref >60)
Glucose, Bld: 109 mg/dL — ABNORMAL HIGH (ref 70–99)
Potassium: 3.3 mmol/L — ABNORMAL LOW (ref 3.5–5.1)
Sodium: 136 mmol/L (ref 135–145)

## 2021-08-08 LAB — CBC
HCT: 27 % — ABNORMAL LOW (ref 36.0–46.0)
Hemoglobin: 8.5 g/dL — ABNORMAL LOW (ref 12.0–15.0)
MCH: 26.5 pg (ref 26.0–34.0)
MCHC: 31.5 g/dL (ref 30.0–36.0)
MCV: 84.1 fL (ref 80.0–100.0)
Platelets: 196 10*3/uL (ref 150–400)
RBC: 3.21 MIL/uL — ABNORMAL LOW (ref 3.87–5.11)
RDW: 14.9 % (ref 11.5–15.5)
WBC: 11.8 10*3/uL — ABNORMAL HIGH (ref 4.0–10.5)
nRBC: 0 % (ref 0.0–0.2)

## 2021-08-08 LAB — URINE CULTURE

## 2021-08-08 LAB — MAGNESIUM: Magnesium: 2 mg/dL (ref 1.7–2.4)

## 2021-08-08 MED ORDER — POTASSIUM CHLORIDE CRYS ER 20 MEQ PO TBCR
20.0000 meq | EXTENDED_RELEASE_TABLET | Freq: Once | ORAL | Status: AC
  Administered 2021-08-08: 20 meq via ORAL
  Filled 2021-08-08: qty 1

## 2021-08-08 MED ORDER — POTASSIUM CHLORIDE IN NACL 20-0.9 MEQ/L-% IV SOLN: INTRAVENOUS | Status: DC

## 2021-08-08 NOTE — Progress Notes (Signed)
PROGRESS NOTE  Courtney Dyer [id number] DOB: November 04, 1966 DOA: 08/06/2021 PCP: Pcp, No  Brief History:  55 year old with history of anemia admitted to the hospital for nausea vomiting ongoing for about 2 weeks.  Found to be dehydrated with acute kidney injury creatinine of 5.3, baseline 1.7.  She was started on IV fluids.  She also had hypokalemia.  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  Assessment/Plan: Nausea vomiting, -due to viral gastritis and UTI - This is improved.  Diet as tolerated.  CT abdomen pelvis does not show anything acute besides severely dysplastic right kidney.  Supportive care.   Acute kidney injury - Baseline creatinine 1.7-2.0,  -admission creatinine 5.3 which is improving.   -Continue IV fluids.   Hypokalemia - Repletion.  Check magnesium and phosphorus   Candida intertrigo - Topical nystatin  Anemia of chronic disease - Baseline hemoglobin ~9  Essential hypertension - Lisinopril on hold in setting of AKI.  IV hydralazine as needed -BP controlled  Adrenal mass - Stable.  Follow-up outpatient      Status is: Inpatient  Remains inpatient appropriate because:Persistent severe electrolyte disturbances  Dispo: The patient is from: Home              Anticipated d/c is to: Home              Patient currently is not medically stable to d/c.   Difficult to place patient No        Family Communication:  no Family at bedside  Consultants:  none  Code Status:  FULL   DVT Prophylaxis:  [place] Heparin   Procedures: As Listed in Progress Note Above  Antibiotics: None     Subjective:  Patient denies fevers, chills, headache, chest pain, dyspnea, nausea, vomiting, diarrhea, abdominal pain, dysuria, hematuria, hematochezia, and melena.  Objective: Vitals:   08/07/21 1306 08/07/21 2111 08/07/21 2231 08/08/21 0515  BP: 110/82 (!) 84/62 108/80 (!) 114/59  Pulse:  92 89 92 96  Resp: '18 17  17  '$ Temp: 97.8 F (36.6 C) 98.4 F (36.9 C)  98.4 F (36.9 C)  TempSrc: Oral Oral    SpO2: 100% 100%  100%  Weight:      Height:        Intake/Output Summary (Last 24 hours) at 08/08/2021 1148 Last data filed at 08/07/2021 1700 Gross per 24 hour  Intake 2482.89 ml  Output --  Net 2482.89 ml   Weight change:  Exam:  General:  Pt is alert, follows commands appropriately, not in acute distress HEENT: No icterus, No thrush, No neck mass, [place]/AT Cardiovascular: RRR, S1/S2, no rubs, no gallops Respiratory: CTA bilaterally, no wheezing, no crackles, no rhonchi Abdomen: Soft/+BS, non tender, non distended, no guarding Extremities: No edema, No lymphangitis, No petechiae, No rashes, no synovitis   Data Reviewed: I have personally reviewed following labs and imaging studies Basic Metabolic Panel: Recent Labs  Lab 08/06/21 1204 08/06/21 2142 08/07/21 0559 08/08/21 0647  NA 135  --  136 136  K 2.5* 3.3* 2.8* 3.3*  CL 79*  --  92* 96*  CO2 34*  --  28 28  GLUCOSE 153*  --  116* 109*  BUN 56*  --  49* 39*  CREATININE 5.30*  --  3.79* 2.75*  CALCIUM 6.6*  --  6.9* 8.4*  MG 1.2*  --  2.6* 2.0  PHOS  --   --  3.5  --    Liver Function Tests: Recent Labs  Lab 08/06/21 1204  AST 15  ALT 8  ALKPHOS 76  BILITOT 1.5*  PROT 7.7  ALBUMIN 2.9*   No results for input(s): LIPASE, AMYLASE in the last 168 hours. No results for input(s): AMMONIA in the last 168 hours. Coagulation Profile: Recent Labs  Lab 08/06/21 1354  INR 1.1   CBC: Recent Labs  Lab 08/06/21 1204 08/07/21 0559  WBC 18.5* 11.9*  NEUTROABS 16.6*  --   HGB 9.9* 7.6*  HCT 30.8* 24.3*  MCV 83.5 84.4  PLT 247 182   Cardiac Enzymes: No results for input(s): CKTOTAL, CKMB, CKMBINDEX, TROPONINI in the last 168 hours. BNP: Invalid input(s): POCBNP CBG: Recent Labs  Lab 08/06/21 1233  GLUCAP 142*   HbA1C: No results for input(s): HGBA1C in the last 72 hours. Urine  analysis:    Component Value Date/Time   COLORURINE YELLOW 08/06/2021 1553   APPEARANCEUR TURBID (A) 08/06/2021 1553   LABSPEC 1.014 08/06/2021 1553   PHURINE 5.0 08/06/2021 1553   GLUCOSEU NEGATIVE 08/06/2021 1553   HGBUR SMALL (A) 08/06/2021 1553   BILIRUBINUR NEGATIVE 08/06/2021 1553   KETONESUR 5 (A) 08/06/2021 1553   PROTEINUR 30 (A) 08/06/2021 1553   NITRITE NEGATIVE 08/06/2021 1553   LEUKOCYTESUR MODERATE (A) 08/06/2021 1553   Sepsis Labs: '@LABRCNTIP'$ (procalcitonin:4,lacticidven:4) ) Recent Results (from the past 240 hour(s))  Blood culture (routine single)     Status: None (Preliminary result)   Collection Time: 08/06/21  1:54 PM   Specimen: BLOOD RIGHT HAND  Result Value Ref Range Status   Specimen Description BLOOD RIGHT HAND  Final   Special Requests   Final    Blood Culture results may not be optimal due to an inadequate volume of blood received in culture bottles BOTTLES DRAWN AEROBIC AND ANAEROBIC   Culture   Final    NO GROWTH 2 DAYS Performed at [facility], [address] [place], [place] [address]    Report Status PENDING  Incomplete  Resp Panel by RT-PCR (Flu A&B, Covid) Nasopharyngeal Swab     Status: None   Collection Time: 08/06/21  2:26 PM   Specimen: Nasopharyngeal Swab; Nasopharyngeal(NP) swabs in vial transport medium  Result Value Ref Range Status   SARS Coronavirus 2 by RT PCR NEGATIVE NEGATIVE Final    Comment: (NOTE) SARS-CoV-2 target nucleic acids are NOT DETECTED.  The SARS-CoV-2 RNA is generally detectable in upper respiratory specimens during the acute phase of infection. The lowest concentration of SARS-CoV-2 viral copies this assay can detect is 138 copies/mL. A negative result does not preclude SARS-Cov-2 infection and should not be used as the sole basis for treatment or other patient management decisions. A negative result may occur with  improper specimen collection/handling, submission of specimen other than nasopharyngeal swab,  presence of viral mutation(s) within the areas targeted by this assay, and inadequate number of viral copies(<138 copies/mL). A negative result must be combined with clinical observations, patient history, and epidemiological information. The expected result is Negative.  Fact Sheet for Patients:  EntrepreneurPulse.com.au  Fact Sheet for Healthcare Providers:  IncredibleEmployment.be  This test is no t yet approved or cleared by the [place] FDA and  has been authorized for detection and/or diagnosis of SARS-CoV-2 by FDA under an Emergency Use Authorization (EUA). This EUA will remain  in effect (meaning this test can be used) for the duration of the COVID-19 declaration under Section 564(b)(1) of the Act, 21 U.S.C.section 360bbb-3(b)(1),  unless the authorization is terminated  or revoked sooner.       Influenza A by PCR NEGATIVE NEGATIVE Final   Influenza B by PCR NEGATIVE NEGATIVE Final    Comment: (NOTE) The Xpert Xpress SARS-CoV-2/FLU/RSV plus assay is intended as an aid in the diagnosis of influenza from Nasopharyngeal swab specimens and should not be used as a sole basis for treatment. Nasal washings and aspirates are unacceptable for Xpert Xpress SARS-CoV-2/FLU/RSV testing.  Fact Sheet for Patients: EntrepreneurPulse.com.au  Fact Sheet for Healthcare Providers: IncredibleEmployment.be  This test is not yet approved or cleared by the [place] FDA and has been authorized for detection and/or diagnosis of SARS-CoV-2 by FDA under an Emergency Use Authorization (EUA). This EUA will remain in effect (meaning this test can be used) for the duration of the COVID-19 declaration under Section 564(b)(1) of the Act, 21 U.S.C. section 360bbb-3(b)(1), unless the authorization is terminated or revoked.  Performed at [facility], [address], [place], [place] [address]   Urine Culture     Status:  Abnormal   Collection Time: 08/06/21  3:53 PM   Specimen: In/Out Cath Urine  Result Value Ref Range Status   Specimen Description   Final    IN/OUT CATH URINE Performed at [facility], [address], [place], [place] [address]    Special Requests   Final    NONE Performed at [facility], [address] [place], [place] [address]    Culture MULTIPLE SPECIES PRESENT, SUGGEST RECOLLECTION (A)  Final   Report Status 08/08/2021 FINAL  Final     Scheduled Meds:  folic acid  1 mg Oral Daily   heparin  5,000 Units Subcutaneous Q8H   nystatin   Topical TID   Continuous Infusions:  Procedures/Studies: CT ABDOMEN PELVIS WO CONTRAST  Result Date: 08/06/2021 CLINICAL DATA:  Nausea and vomiting. Weakness. Poor food intake. Stage Ali elevated creatinine on today's labs. EXAM: CT ABDOMEN AND PELVIS WITHOUT CONTRAST TECHNIQUE: Multidetector CT imaging of the abdomen and pelvis was performed following the standard protocol without IV contrast. COMPARISON:  04/23/2021 FINDINGS: Lower chest: Linear subsegmental atelectasis or scarring in the right lower lobe. Hepatobiliary: Faint dependent heterogeneity in the gallbladder, potentially from sludge or gallstones. No gallbladder wall thickening or pericholecystic fluid. The liver appears otherwise unremarkable. Pancreas: Unremarkable Spleen: Unremarkable Adrenals/Urinary Tract: 1.9 by 2.5 cm left adrenal mass with internal density 16 Hounsfield units, indeterminate. The right adrenal gland appears normal. There is only a small nodule of tissue at the expected location of the right kidney, compatible with dysplastic kidney. Urinary bladder unremarkable. Retained fetal lobulation of the left kidney. No hydronephrosis or hydroureter. Stomach/Bowel: Fatty deposition in the wall of the cecum which is typically an incidental normal variant although has a weak association with inflammatory bowel disease. Currently the terminal ileum appears grossly unremarkable.  Normal appendix. The colon is relatively empty which correlates with the patient's history of reduced oral intake. There some scattered diverticula of the descending and proximal sigmoid colon without findings of active diverticulitis. No dilated bowel. Vascular/Lymphatic: Unremarkable Reproductive: Unremarkable Other: No supplemental non-categorized findings. Musculoskeletal: Stable mild SI joint arthropathy with suspected erosions along the SI joints. Mild to moderate lumbar spondylosis and degenerative disc disease. Small umbilical hernia contains adipose tissue. Bridging spurring in the lower thoracic spine. IMPRESSION: 1. A specific cause for the patient's symptoms is not identified. There is currently no left hydronephrosis or hydroureter to correlate with the patient's recent elevated creatinine. The right kidney remains  severely dysplastic and nonfunctional. 2. Stable left adrenal mass, with nonspecific internal density. 3. Sludge versus gallstones in the gallbladder. No pericholecystic fluid or gallbladder wall thickening. 4. Suspected chronic erosions along the SI joints compatible with bilateral sacroiliitis. Electronically Signed   By: Van Clines M.D.   On: 08/06/2021 14:58   DG Chest Port 1 View  Result Date: 08/06/2021 CLINICAL DATA:  Concern for sepsis EXAM: PORTABLE CHEST 1 VIEW COMPARISON:  Chest x-ray dated November 26, 2015 FINDINGS: Cardiac and mediastinal contours are within normal limits. Bibasilar atelectasis. Lungs otherwise clear. No evidence of pleural effusion or pneumothorax. IMPRESSION: No active disease. Electronically Signed   By: Yetta Glassman M.D.   On: 08/06/2021 13:45    Orson Eva, DO  [facility]  If 7PM-7AM, please contact night-coverage [email or web address] Password [email or web address] 08/08/2021, 11:48 AM   LOS: 2 days

## 2021-08-09 LAB — BASIC METABOLIC PANEL
Anion gap: 8 (ref 5–15)
BUN: 34 mg/dL — ABNORMAL HIGH (ref 6–20)
CO2: 28 mmol/L (ref 22–32)
Calcium: 8 mg/dL — ABNORMAL LOW (ref 8.9–10.3)
Chloride: 101 mmol/L (ref 98–111)
Creatinine, Ser: 2.31 mg/dL — ABNORMAL HIGH (ref 0.44–1.00)
GFR, Estimated: 25 mL/min — ABNORMAL LOW (ref >60)
Glucose, Bld: 127 mg/dL — ABNORMAL HIGH (ref 70–99)
Potassium: 3.7 mmol/L (ref 3.5–5.1)
Sodium: 137 mmol/L (ref 135–145)

## 2021-08-09 LAB — MAGNESIUM: Magnesium: 1.6 mg/dL — ABNORMAL LOW (ref 1.7–2.4)

## 2021-08-09 MED ORDER — MAGNESIUM OXIDE -MG SUPPLEMENT 400 (240 MG) MG PO TABS: 400.0000 mg | ORAL_TABLET | Freq: Every day | ORAL | 0 refills | Status: DC

## 2021-08-09 MED ORDER — FOLIC ACID 1 MG PO TABS: 1.0000 mg | ORAL_TABLET | Freq: Every day | ORAL | Status: DC

## 2021-08-09 MED ORDER — CEFDINIR 300 MG PO CAPS
300.0000 mg | ORAL_CAPSULE | Freq: Every day | ORAL | Status: DC
  Administered 2021-08-09: 300 mg via ORAL
  Filled 2021-08-09: qty 1

## 2021-08-09 MED ORDER — MAGNESIUM OXIDE -MG SUPPLEMENT 400 (240 MG) MG PO TABS
400.0000 mg | ORAL_TABLET | Freq: Every day | ORAL | Status: DC
  Administered 2021-08-09: 400 mg via ORAL
  Filled 2021-08-09: qty 1

## 2021-08-09 MED ORDER — CEFDINIR 300 MG PO CAPS: 300.0000 mg | ORAL_CAPSULE | Freq: Every day | ORAL | 0 refills | Status: DC

## 2021-08-09 NOTE — Clinical Social Work Note (Signed)
CSW contacted patient to discuss [place]. Patient requested CSW call patient's cell phone. CSW called patient's cell phone 3x and received no answer. CSW attempted to call room phone back. Patient had been discharged. CSW LVM with patient.

## 2021-08-09 NOTE — Progress Notes (Signed)
Nsg Discharge Note  Admit Date:  08/06/2021 Discharge date: 08/09/2021   Courtney Dyer to be D/C'd Home per MD order.  AVS completed.  Copy for chart, and copy for patient signed, and dated. Patient/caregiver able to verbalize understanding. IV removed. Discharge paper work given and reviewed with patient. Patient stable upon discharge.   Discharge Medication: Allergies as of 08/09/2021   No Known Allergies      Medication List     STOP taking these medications    lisinopril 10 MG tablet Commonly known as: ZESTRIL   ondansetron 4 MG tablet Commonly known as: ZOFRAN       TAKE these medications    cefdinir 300 MG capsule Commonly known as: OMNICEF Take 1 capsule (300 mg total) by mouth daily. Start taking on: September 26, [id number]   folic acid 1 MG tablet Commonly known as: FOLVITE Take 1 tablet (1 mg total) by mouth daily. Start taking on: August 10, 2021   magnesium oxide 400 (240 Mg) MG tablet Commonly known as: MAG-OX Take 1 tablet (400 mg total) by mouth daily.        Discharge Assessment: Vitals:   08/09/21 0546 08/09/21 0600  BP: 117/88 117/88  Pulse: (!) 122 88  Resp: 18 16  Temp: 98 F (36.7 C) 98 F (36.7 C)  SpO2: 100% 100%   Skin clean, dry and intact without evidence of skin break down, no evidence of skin tears noted. IV catheter discontinued intact. Site without signs and symptoms of complications - no redness or edema noted at insertion site, patient denies c/o pain - only slight tenderness at site.  Dressing with slight pressure applied.  D/c Instructions-Education: Discharge instructions given to patient/family with verbalized understanding. D/c education completed with patient/family including follow up instructions, medication list, d/c activities limitations if indicated, with other d/c instructions as indicated by MD - patient able to verbalize understanding, all questions fully answered. Patient instructed to return to ED, call  911, or call MD for any changes in condition.  Patient escorted via [place], and D/C home via private auto.  Zenaida Deed, RN 08/09/2021 12:49 PM

## 2021-08-09 NOTE — Discharge Summary (Signed)
Physician Discharge Summary  Courtney Dyer [id number] DOB: 12-20-1965 DOA: 08/06/2021  PCP: Merryl Hacker, No  Admit date: 08/06/2021 Discharge date: 08/09/2021  Admitted From: Home Disposition:  Home   Recommendations for Outpatient Follow-up:  Follow up with PCP in 1-2 weeks Please obtain BMP/CBC in one week   Home Health:[facility]PT  Discharge Condition: Stable CODE STATUS:FULL Diet recommendation: Regular   Brief/Interim Summary: 55 year old with history of anemia admitted to the hospital for nausea vomiting ongoing for about 2 weeks.  Found to be dehydrated with acute kidney injury creatinine of 5.3, baseline 1.7.  She was started on IV fluids.  She also had hypokalemia.  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  She was treated with conservative measures including bowel rest.  Her n/v improved and her diet was advanced which she tolerated.  Discharge Diagnoses:  Nausea vomiting, -due to viral gastritis and UTI - This is improved.  Diet as tolerated.  CT abdomen pelvis does not show anything acute besides severely dysplastic right kidney.  Supportive care. -started cefdinir x 5 days   Acute kidney injury - Baseline creatinine 1.7-2.0,  -admission creatinine 5.3 which is improving.   -Continued IV fluids. -serum creatinine 2.31 on day of d/c -repeat BMP one week after d/c   Hypokalemia - Repletion.  Check magnesium and phosphorus   Candida intertrigo - Topical nystatin  Anemia of chronic disease - Baseline hemoglobin ~9  Essential hypertension - Lisinopril on hold in setting of AKI.  IV hydralazine as needed -BP remains controlled  Adrenal mass - Stable.  Follow-up outpatient  Hypomagnesemia -mag ox daily  Low Folate -replete       Discharge Instructions   Allergies as of 08/09/2021   No Known Allergies      Medication List     STOP taking these medications    lisinopril 10 MG  tablet Commonly known as: ZESTRIL   ondansetron 4 MG tablet Commonly known as: ZOFRAN       TAKE these medications    cefdinir 300 MG capsule Commonly known as: OMNICEF Take 1 capsule (300 mg total) by mouth daily. Start taking on: September 26, 123456   folic acid 1 MG tablet Commonly known as: FOLVITE Take 1 tablet (1 mg total) by mouth daily. Start taking on: August 10, 2021   magnesium oxide 400 (240 Mg) MG tablet Commonly known as: MAG-OX Take 1 tablet (400 mg total) by mouth daily.        No Known Allergies  Consultations: none   Procedures/Studies: CT ABDOMEN PELVIS WO CONTRAST  Result Date: 08/06/2021 CLINICAL DATA:  Nausea and vomiting. Weakness. Poor food intake. Stage Ali elevated creatinine on today's labs. EXAM: CT ABDOMEN AND PELVIS WITHOUT CONTRAST TECHNIQUE: Multidetector CT imaging of the abdomen and pelvis was performed following the standard protocol without IV contrast. COMPARISON:  04/23/2021 FINDINGS: Lower chest: Linear subsegmental atelectasis or scarring in the right lower lobe. Hepatobiliary: Faint dependent heterogeneity in the gallbladder, potentially from sludge or gallstones. No gallbladder wall thickening or pericholecystic fluid. The liver appears otherwise unremarkable. Pancreas: Unremarkable Spleen: Unremarkable Adrenals/Urinary Tract: 1.9 by 2.5 cm left adrenal mass with internal density 16 Hounsfield units, indeterminate. The right adrenal gland appears normal. There is only a small nodule of tissue at the expected location of the right kidney, compatible with dysplastic kidney. Urinary bladder unremarkable. Retained fetal lobulation of the left kidney. No hydronephrosis or hydroureter. Stomach/Bowel: Fatty deposition in the wall of the cecum  which is typically an incidental normal variant although has a weak association with inflammatory bowel disease. Currently the terminal ileum appears grossly unremarkable. Normal appendix. The colon is  relatively empty which correlates with the patient's history of reduced oral intake. There some scattered diverticula of the descending and proximal sigmoid colon without findings of active diverticulitis. No dilated bowel. Vascular/Lymphatic: Unremarkable Reproductive: Unremarkable Other: No supplemental non-categorized findings. Musculoskeletal: Stable mild SI joint arthropathy with suspected erosions along the SI joints. Mild to moderate lumbar spondylosis and degenerative disc disease. Small umbilical hernia contains adipose tissue. Bridging spurring in the lower thoracic spine. IMPRESSION: 1. A specific cause for the patient's symptoms is not identified. There is currently no left hydronephrosis or hydroureter to correlate with the patient's recent elevated creatinine. 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 2. Stable left adrenal mass, with nonspecific internal density. 3. Sludge versus gallstones in the gallbladder. No pericholecystic fluid or gallbladder wall thickening. 4. Suspected chronic erosions along the SI joints compatible with bilateral sacroiliitis. Electronically Signed   By: Van Clines M.D.   On: 08/06/2021 14:58   DG Chest Port 1 View  Result Date: 08/06/2021 CLINICAL DATA:  Concern for sepsis EXAM: PORTABLE CHEST 1 VIEW COMPARISON:  Chest x-ray dated November 26, 2015 FINDINGS: Cardiac and mediastinal contours are within normal limits. Bibasilar atelectasis. Lungs otherwise clear. No evidence of pleural effusion or pneumothorax. IMPRESSION: No active disease. Electronically Signed   By: Yetta Glassman M.D.   On: 08/06/2021 13:45        Discharge Exam: Vitals:   08/09/21 0546 08/09/21 0600  BP: 117/88 117/88  Pulse: (!) 122 88  Resp: 18 16  Temp: 98 F (36.7 C) 98 F (36.7 C)  SpO2: 100% 100%   Vitals:   08/08/21 2113 08/08/21 2130 08/09/21 0546 08/09/21 0600  BP: 100/74 112/90 117/88 117/88  Pulse: (!) 101  (!) 122 88  Resp: '16 16 18 16   '$ Temp: 97.8 F (36.6 C) 97.8 F (36.6 C) 98 F (36.7 C) 98 F (36.7 C)  TempSrc: Oral Oral Oral Oral  SpO2: 96% 96% 100% 100%  Weight:      Height:        General: Pt is alert, awake, not in acute distress Cardiovascular: RRR, S1/S2 +, no rubs, no gallops Respiratory: CTA bilaterally, no wheezing, no rhonchi Abdominal: Soft, NT, ND, bowel sounds + Extremities: no edema, no cyanosis   The results of significant diagnostics from this hospitalization (including imaging, microbiology, ancillary and laboratory) are listed below for reference.    Significant Diagnostic Studies: CT ABDOMEN PELVIS WO CONTRAST  Result Date: 08/06/2021 CLINICAL DATA:  Nausea and vomiting. Weakness. Poor food intake. Stage Ali elevated creatinine on today's labs. EXAM: CT ABDOMEN AND PELVIS WITHOUT CONTRAST TECHNIQUE: Multidetector CT imaging of the abdomen and pelvis was performed following the standard protocol without IV contrast. COMPARISON:  04/23/2021 FINDINGS: Lower chest: Linear subsegmental atelectasis or scarring in the right lower lobe. Hepatobiliary: Faint dependent heterogeneity in the gallbladder, potentially from sludge or gallstones. No gallbladder wall thickening or pericholecystic fluid. The liver appears otherwise unremarkable. Pancreas: Unremarkable Spleen: Unremarkable Adrenals/Urinary Tract: 1.9 by 2.5 cm left adrenal mass with internal density 16 Hounsfield units, indeterminate. The right adrenal gland appears normal. There is only a small nodule of tissue at the expected location of the right kidney, compatible with dysplastic kidney. Urinary bladder unremarkable. Retained fetal lobulation of the left kidney. No hydronephrosis or hydroureter. Stomach/Bowel: Fatty deposition in the wall  of the cecum which is typically an incidental normal variant although has a weak association with inflammatory bowel disease. Currently the terminal ileum appears grossly unremarkable. Normal appendix. The colon is  relatively empty which correlates with the patient's history of reduced oral intake. There some scattered diverticula of the descending and proximal sigmoid colon without findings of active diverticulitis. No dilated bowel. Vascular/Lymphatic: Unremarkable Reproductive: Unremarkable Other: No supplemental non-categorized findings. Musculoskeletal: Stable mild SI joint arthropathy with suspected erosions along the SI joints. Mild to moderate lumbar spondylosis and degenerative disc disease. Small umbilical hernia contains adipose tissue. Bridging spurring in the lower thoracic spine. IMPRESSION: 1. A specific cause for the patient's symptoms is not identified. There is currently no left hydronephrosis or hydroureter to correlate with the patient's recent elevated creatinine. 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 2. Stable left adrenal mass, with nonspecific internal density. 3. Sludge versus gallstones in the gallbladder. No pericholecystic fluid or gallbladder wall thickening. 4. Suspected chronic erosions along the SI joints compatible with bilateral sacroiliitis. Electronically Signed   By: Van Clines M.D.   On: 08/06/2021 14:58   DG Chest Port 1 View  Result Date: 08/06/2021 CLINICAL DATA:  Concern for sepsis EXAM: PORTABLE CHEST 1 VIEW COMPARISON:  Chest x-ray dated November 26, 2015 FINDINGS: Cardiac and mediastinal contours are within normal limits. Bibasilar atelectasis. Lungs otherwise clear. No evidence of pleural effusion or pneumothorax. IMPRESSION: No active disease. Electronically Signed   By: Yetta Glassman M.D.   On: 08/06/2021 13:45    Microbiology: Recent Results (from the past 240 hour(s))  Blood culture (routine single)     Status: None (Preliminary result)   Collection Time: 08/06/21  1:54 PM   Specimen: BLOOD RIGHT HAND  Result Value Ref Range Status   Specimen Description BLOOD RIGHT HAND  Final   Special Requests   Final    Blood Culture results  may not be optimal due to an inadequate volume of blood received in culture bottles BOTTLES DRAWN AEROBIC AND ANAEROBIC   Culture   Final    NO GROWTH 3 DAYS Performed at [facility], [address] [place], [place] [address]    Report Status PENDING  Incomplete  Resp Panel by RT-PCR (Flu A&B, Covid) Nasopharyngeal Swab     Status: None   Collection Time: 08/06/21  2:26 PM   Specimen: Nasopharyngeal Swab; Nasopharyngeal(NP) swabs in vial transport medium  Result Value Ref Range Status   SARS Coronavirus 2 by RT PCR NEGATIVE NEGATIVE Final    Comment: (NOTE) SARS-CoV-2 target nucleic acids are NOT DETECTED.  The SARS-CoV-2 RNA is generally detectable in upper respiratory specimens during the acute phase of infection. The lowest concentration of SARS-CoV-2 viral copies this assay can detect is 138 copies/mL. A negative result does not preclude SARS-Cov-2 infection and should not be used as the sole basis for treatment or other patient management decisions. A negative result may occur with  improper specimen collection/handling, submission of specimen other than nasopharyngeal swab, presence of viral mutation(s) within the areas targeted by this assay, and inadequate number of viral copies(<138 copies/mL). A negative result must be combined with clinical observations, patient history, and epidemiological information. The expected result is Negative.  Fact Sheet for Patients:  EntrepreneurPulse.com.au  Fact Sheet for Healthcare Providers:  IncredibleEmployment.be  This test is no t yet approved or cleared by the [place] FDA and  has been authorized for detection and/or diagnosis of SARS-CoV-2 by FDA under an Emergency Use  Authorization (EUA). This EUA will remain  in effect (meaning this test can be used) for the duration of the COVID-19 declaration under Section 564(b)(1) of the Act, 21 U.S.C.section 360bbb-3(b)(1), unless the authorization  is terminated  or revoked sooner.       Influenza A by PCR NEGATIVE NEGATIVE Final   Influenza B by PCR NEGATIVE NEGATIVE Final    Comment: (NOTE) The Xpert Xpress SARS-CoV-2/FLU/RSV plus assay is intended as an aid in the diagnosis of influenza from Nasopharyngeal swab specimens and should not be used as a sole basis for treatment. Nasal washings and aspirates are unacceptable for Xpert Xpress SARS-CoV-2/FLU/RSV testing.  Fact Sheet for Patients: EntrepreneurPulse.com.au  Fact Sheet for Healthcare Providers: IncredibleEmployment.be  This test is not yet approved or cleared by the [place] FDA and has been authorized for detection and/or diagnosis of SARS-CoV-2 by FDA under an Emergency Use Authorization (EUA). This EUA will remain in effect (meaning this test can be used) for the duration of the COVID-19 declaration under Section 564(b)(1) of the Act, 21 U.S.C. section 360bbb-3(b)(1), unless the authorization is terminated or revoked.  Performed at [facility], [address] [place], [place] [address]   Urine Culture     Status: Abnormal   Collection Time: 08/06/21  3:53 PM   Specimen: In/Out Cath Urine  Result Value Ref Range Status   Specimen Description   Final    IN/OUT CATH URINE Performed at [facility], [address], [place], [place] [address]    Special Requests   Final    NONE Performed at [facility], [address] [place], [place] [address]    Culture MULTIPLE SPECIES PRESENT, SUGGEST RECOLLECTION (A)  Final   Report Status 08/08/2021 FINAL  Final     Labs: Basic Metabolic Panel: Recent Labs  Lab 08/06/21 1204 08/06/21 2142 08/07/21 0559 08/08/21 0647 08/09/21 0619  NA 135  --  136 136 137  K 2.5*   < > 2.8* 3.3* 3.7  CL 79*  --  92* 96* 101  CO2 34*  --  '28 28 28  '$ GLUCOSE 153*  --  116* 109* 127*  BUN 56*  --  49* 39* 34*  CREATININE 5.30*  --  3.79* 2.75* 2.31*  CALCIUM 6.6*  --  6.9* 8.4* 8.0*   MG 1.2*  --  2.6* 2.0 1.6*  PHOS  --   --  3.5  --   --    < > = values in this interval not displayed.   Liver Function Tests: Recent Labs  Lab 08/06/21 1204  AST 15  ALT 8  ALKPHOS 76  BILITOT 1.5*  PROT 7.7  ALBUMIN 2.9*   No results for input(s): LIPASE, AMYLASE in the last 168 hours. No results for input(s): AMMONIA in the last 168 hours. CBC: Recent Labs  Lab 08/06/21 1204 08/07/21 0559 08/08/21 1219  WBC 18.5* 11.9* 11.8*  NEUTROABS 16.6*  --   --   HGB 9.9* 7.6* 8.5*  HCT 30.8* 24.3* 27.0*  MCV 83.5 84.4 84.1  PLT 247 182 196   Cardiac Enzymes: No results for input(s): CKTOTAL, CKMB, CKMBINDEX, TROPONINI in the last 168 hours. BNP: Invalid input(s): POCBNP CBG: Recent Labs  Lab 08/06/21 1233  GLUCAP 142*    Time coordinating discharge:  36 minutes  Signed:  Orson Eva, DO [facility] Pager: [phone] 08/09/2021, 11:44 AM

## 2021-08-11 LAB — CULTURE, BLOOD (SINGLE): Culture: NO GROWTH

## 2021-08-26 ENCOUNTER — Inpatient Hospital Stay (HOSPITAL_COMMUNITY)
Admission: EM | Admit: 2021-08-26 | Discharge: 2021-09-01 | DRG: 369 | Disposition: A | Discharge status: Skilled Nursing Facility | Payer: Medicare Other | Attending: Family Medicine | Admitting: Family Medicine

## 2021-08-26 ENCOUNTER — Encounter (HOSPITAL_COMMUNITY): Payer: Self-pay

## 2021-08-26 ENCOUNTER — Emergency Department (HOSPITAL_COMMUNITY): Payer: Medicare Other

## 2021-08-26 ENCOUNTER — Other Ambulatory Visit: Payer: Self-pay

## 2021-08-26 DIAGNOSIS — E785 Hyperlipidemia, unspecified: Secondary | ICD-10-CM | POA: Diagnosis present

## 2021-08-26 DIAGNOSIS — K2101 Gastro-esophageal reflux disease with esophagitis, with bleeding: Secondary | ICD-10-CM | POA: Diagnosis present

## 2021-08-26 DIAGNOSIS — Z6841 Body Mass Index (BMI) 40.0 and over, adult: Secondary | ICD-10-CM | POA: Diagnosis not present

## 2021-08-26 DIAGNOSIS — E871 Hypo-osmolality and hyponatremia: Secondary | ICD-10-CM | POA: Diagnosis present

## 2021-08-26 DIAGNOSIS — E876 Hypokalemia: Principal | ICD-10-CM | POA: Diagnosis present

## 2021-08-26 DIAGNOSIS — D649 Anemia, unspecified: Secondary | ICD-10-CM | POA: Diagnosis present

## 2021-08-26 DIAGNOSIS — K2980 Duodenitis without bleeding: Secondary | ICD-10-CM | POA: Diagnosis present

## 2021-08-26 DIAGNOSIS — Z8249 Family history of ischemic heart disease and other diseases of the circulatory system: Secondary | ICD-10-CM

## 2021-08-26 DIAGNOSIS — F17210 Nicotine dependence, cigarettes, uncomplicated: Secondary | ICD-10-CM | POA: Diagnosis present

## 2021-08-26 DIAGNOSIS — R531 Weakness: Secondary | ICD-10-CM

## 2021-08-26 DIAGNOSIS — R338 Other retention of urine: Secondary | ICD-10-CM | POA: Diagnosis not present

## 2021-08-26 DIAGNOSIS — R079 Chest pain, unspecified: Secondary | ICD-10-CM

## 2021-08-26 DIAGNOSIS — Z79899 Other long term (current) drug therapy: Secondary | ICD-10-CM

## 2021-08-26 DIAGNOSIS — Z20822 Contact with and (suspected) exposure to covid-19: Secondary | ICD-10-CM | POA: Diagnosis present

## 2021-08-26 DIAGNOSIS — Q614 Renal dysplasia: Secondary | ICD-10-CM | POA: Diagnosis not present

## 2021-08-26 DIAGNOSIS — R1310 Dysphagia, unspecified: Secondary | ICD-10-CM | POA: Diagnosis not present

## 2021-08-26 DIAGNOSIS — E7800 Pure hypercholesterolemia, unspecified: Secondary | ICD-10-CM | POA: Diagnosis present

## 2021-08-26 DIAGNOSIS — E878 Other disorders of electrolyte and fluid balance, not elsewhere classified: Secondary | ICD-10-CM | POA: Diagnosis present

## 2021-08-26 DIAGNOSIS — R339 Retention of urine, unspecified: Secondary | ICD-10-CM | POA: Diagnosis present

## 2021-08-26 DIAGNOSIS — K922 Gastrointestinal hemorrhage, unspecified: Secondary | ICD-10-CM | POA: Diagnosis not present

## 2021-08-26 DIAGNOSIS — E46 Unspecified protein-calorie malnutrition: Secondary | ICD-10-CM | POA: Diagnosis present

## 2021-08-26 DIAGNOSIS — K2970 Gastritis, unspecified, without bleeding: Secondary | ICD-10-CM | POA: Diagnosis present

## 2021-08-26 DIAGNOSIS — L8990 Pressure ulcer of unspecified site, unspecified stage: Secondary | ICD-10-CM | POA: Insufficient documentation

## 2021-08-26 DIAGNOSIS — K209 Esophagitis, unspecified without bleeding: Secondary | ICD-10-CM | POA: Diagnosis not present

## 2021-08-26 DIAGNOSIS — D62 Acute posthemorrhagic anemia: Secondary | ICD-10-CM

## 2021-08-26 DIAGNOSIS — R000 Tachycardia, unspecified: Secondary | ICD-10-CM | POA: Diagnosis present

## 2021-08-26 DIAGNOSIS — E860 Dehydration: Secondary | ICD-10-CM | POA: Diagnosis present

## 2021-08-26 DIAGNOSIS — K8020 Calculus of gallbladder without cholecystitis without obstruction: Secondary | ICD-10-CM | POA: Diagnosis present

## 2021-08-26 DIAGNOSIS — F1010 Alcohol abuse, uncomplicated: Secondary | ICD-10-CM | POA: Diagnosis present

## 2021-08-26 DIAGNOSIS — R109 Unspecified abdominal pain: Secondary | ICD-10-CM | POA: Diagnosis not present

## 2021-08-26 DIAGNOSIS — R9389 Abnormal findings on diagnostic imaging of other specified body structures: Secondary | ICD-10-CM

## 2021-08-26 DIAGNOSIS — R112 Nausea with vomiting, unspecified: Secondary | ICD-10-CM | POA: Diagnosis present

## 2021-08-26 DIAGNOSIS — K2091 Esophagitis, unspecified with bleeding: Secondary | ICD-10-CM | POA: Diagnosis not present

## 2021-08-26 DIAGNOSIS — D500 Iron deficiency anemia secondary to blood loss (chronic): Secondary | ICD-10-CM | POA: Diagnosis not present

## 2021-08-26 LAB — COMPREHENSIVE METABOLIC PANEL
ALT: 6 U/L (ref 0–44)
AST: 18 U/L (ref 15–41)
Albumin: 2.1 g/dL — ABNORMAL LOW (ref 3.5–5.0)
Alkaline Phosphatase: 74 U/L (ref 38–126)
Anion gap: 15 (ref 5–15)
BUN: 21 mg/dL — ABNORMAL HIGH (ref 6–20)
CO2: 28 mmol/L (ref 22–32)
Calcium: 5.2 mg/dL — CL (ref 8.9–10.3)
Chloride: 90 mmol/L — ABNORMAL LOW (ref 98–111)
Creatinine, Ser: 2.11 mg/dL — ABNORMAL HIGH (ref 0.44–1.00)
GFR, Estimated: 27 mL/min — ABNORMAL LOW (ref >60)
Glucose, Bld: 206 mg/dL — ABNORMAL HIGH (ref 70–99)
Potassium: 2.4 mmol/L — CL (ref 3.5–5.1)
Sodium: 133 mmol/L — ABNORMAL LOW (ref 135–145)
Total Bilirubin: 0.8 mg/dL (ref 0.3–1.2)
Total Protein: 6.4 g/dL — ABNORMAL LOW (ref 6.5–8.1)

## 2021-08-26 LAB — BLOOD GAS, VENOUS
Acid-Base Excess: 9.7 mmol/L — ABNORMAL HIGH (ref 0.0–2.0)
Bicarbonate: 32.1 mmol/L — ABNORMAL HIGH (ref 20.0–28.0)
FIO2: 21
O2 Saturation: 48 %
Patient temperature: 36.8
pCO2, Ven: 53.5 mmHg (ref 44.0–60.0)
pH, Ven: 7.424 (ref 7.250–7.430)
pO2, Ven: 31.7 mmHg — CL (ref 32.0–45.0)

## 2021-08-26 LAB — URINALYSIS, ROUTINE W REFLEX MICROSCOPIC
Bilirubin Urine: NEGATIVE
Glucose, UA: NEGATIVE mg/dL
Ketones, ur: NEGATIVE mg/dL
Leukocytes,Ua: NEGATIVE
Nitrite: NEGATIVE
Protein, ur: NEGATIVE mg/dL
Specific Gravity, Urine: 1.012 (ref 1.005–1.030)
pH: 5 (ref 5.0–8.0)

## 2021-08-26 LAB — CBC WITH DIFFERENTIAL/PLATELET
Abs Immature Granulocytes: 0.16 10*3/uL — ABNORMAL HIGH (ref 0.00–0.07)
Basophils Absolute: 0 10*3/uL (ref 0.0–0.1)
Basophils Relative: 0 %
Eosinophils Absolute: 0 10*3/uL (ref 0.0–0.5)
Eosinophils Relative: 0 %
HCT: 25.6 % — ABNORMAL LOW (ref 36.0–46.0)
Hemoglobin: 8.5 g/dL — ABNORMAL LOW (ref 12.0–15.0)
Immature Granulocytes: 1 %
Lymphocytes Relative: 7 %
Lymphs Abs: 1.1 10*3/uL (ref 0.7–4.0)
MCH: 26.5 pg (ref 26.0–34.0)
MCHC: 33.2 g/dL (ref 30.0–36.0)
MCV: 79.8 fL — ABNORMAL LOW (ref 80.0–100.0)
Monocytes Absolute: 0.6 10*3/uL (ref 0.1–1.0)
Monocytes Relative: 4 %
Neutro Abs: 13.5 10*3/uL — ABNORMAL HIGH (ref 1.7–7.7)
Neutrophils Relative %: 88 %
Platelets: 147 10*3/uL — ABNORMAL LOW (ref 150–400)
RBC: 3.21 MIL/uL — ABNORMAL LOW (ref 3.87–5.11)
RDW: 14.6 % (ref 11.5–15.5)
Smear Review: NONE SEEN
WBC: 15.4 10*3/uL — ABNORMAL HIGH (ref 4.0–10.5)
nRBC: 0 % (ref 0.0–0.2)

## 2021-08-26 LAB — CBG MONITORING, ED
Glucose-Capillary: 194 mg/dL — ABNORMAL HIGH (ref 70–99)
Glucose-Capillary: 210 mg/dL — ABNORMAL HIGH (ref 70–99)

## 2021-08-26 LAB — TROPONIN I (HIGH SENSITIVITY)
Troponin I (High Sensitivity): 14 ng/L (ref <18)
Troponin I (High Sensitivity): 18 ng/L — ABNORMAL HIGH (ref <18)

## 2021-08-26 LAB — MAGNESIUM: Magnesium: 0.7 mg/dL — CL (ref 1.7–2.4)

## 2021-08-26 LAB — LIPASE, BLOOD: Lipase: 20 U/L (ref 11–51)

## 2021-08-26 LAB — RESP PANEL BY RT-PCR (FLU A&B, COVID) ARPGX2
Influenza A by PCR: NEGATIVE
Influenza B by PCR: NEGATIVE
SARS Coronavirus 2 by RT PCR: NEGATIVE

## 2021-08-26 LAB — GLUCOSE, CAPILLARY: Glucose-Capillary: 140 mg/dL — ABNORMAL HIGH (ref 70–99)

## 2021-08-26 LAB — NA AND K (SODIUM & POTASSIUM), RAND UR
Potassium Urine: 25 mmol/L
Sodium, Ur: 14 mmol/L

## 2021-08-26 MED ORDER — THIAMINE HCL 100 MG PO TABS
100.0000 mg | ORAL_TABLET | Freq: Every day | ORAL | Status: DC
  Administered 2021-08-27 – 2021-09-01 (×5): 100 mg via ORAL
  Filled 2021-08-26 (×6): qty 1

## 2021-08-26 MED ORDER — MAGNESIUM SULFATE 2 GM/50ML IV SOLN
2.0000 g | Freq: Once | INTRAVENOUS | Status: AC
  Administered 2021-08-26: 2 g via INTRAVENOUS
  Filled 2021-08-26: qty 50

## 2021-08-26 MED ORDER — ADULT MULTIVITAMIN W/MINERALS CH
1.0000 | ORAL_TABLET | Freq: Every day | ORAL | Status: DC
  Administered 2021-08-27 – 2021-09-01 (×5): 1 via ORAL
  Filled 2021-08-26 (×6): qty 1

## 2021-08-26 MED ORDER — CALCIUM GLUCONATE-NACL 1-0.675 GM/50ML-% IV SOLN
1.0000 g | Freq: Once | INTRAVENOUS | Status: AC
  Administered 2021-08-26: 1000 mg via INTRAVENOUS
  Filled 2021-08-26: qty 50

## 2021-08-26 MED ORDER — ONDANSETRON HCL 4 MG/2ML IJ SOLN
4.0000 mg | Freq: Once | INTRAMUSCULAR | Status: AC
  Administered 2021-08-26: 4 mg via INTRAVENOUS
  Filled 2021-08-26: qty 2

## 2021-08-26 MED ORDER — ONDANSETRON HCL 4 MG PO TABS: 4.0000 mg | ORAL_TABLET | Freq: Four times a day (QID) | ORAL | Status: DC | PRN

## 2021-08-26 MED ORDER — INSULIN ASPART 100 UNIT/ML IJ SOLN
0.0000 [IU] | Freq: Three times a day (TID) | INTRAMUSCULAR | Status: DC
  Administered 2021-08-27: 5 [IU] via SUBCUTANEOUS
  Administered 2021-08-27: 3 [IU] via SUBCUTANEOUS
  Administered 2021-08-29 – 2021-08-30 (×2): 2 [IU] via SUBCUTANEOUS
  Administered 2021-08-31 – 2021-09-01 (×2): 3 [IU] via SUBCUTANEOUS

## 2021-08-26 MED ORDER — ACETAMINOPHEN 650 MG RE SUPP: 650.0000 mg | Freq: Four times a day (QID) | RECTAL | Status: DC | PRN

## 2021-08-26 MED ORDER — LORAZEPAM 1 MG PO TABS
0.0000 mg | ORAL_TABLET | Freq: Two times a day (BID) | ORAL | Status: AC
  Administered 2021-08-29: 1 mg via ORAL
  Filled 2021-08-26: qty 1

## 2021-08-26 MED ORDER — ACETAMINOPHEN 325 MG PO TABS: 650.0000 mg | ORAL_TABLET | Freq: Four times a day (QID) | ORAL | Status: DC | PRN

## 2021-08-26 MED ORDER — LORAZEPAM 1 MG PO TABS
0.0000 mg | ORAL_TABLET | Freq: Four times a day (QID) | ORAL | Status: AC
Start: 2021-08-27 — End: 2021-08-28

## 2021-08-26 MED ORDER — NYSTATIN 100000 UNIT/GM EX POWD
Freq: Three times a day (TID) | CUTANEOUS | Status: DC
  Filled 2021-08-26 (×3): qty 15

## 2021-08-26 MED ORDER — ONDANSETRON HCL 4 MG/2ML IJ SOLN: 4.0000 mg | Freq: Four times a day (QID) | INTRAMUSCULAR | Status: DC | PRN

## 2021-08-26 MED ORDER — POTASSIUM CHLORIDE 10 MEQ/100ML IV SOLN
10.0000 meq | INTRAVENOUS | Status: AC
  Administered 2021-08-26 (×3): 10 meq via INTRAVENOUS
  Filled 2021-08-26 (×3): qty 100

## 2021-08-26 MED ORDER — SODIUM CHLORIDE 0.9 % IV BOLUS
1000.0000 mL | Freq: Once | INTRAVENOUS | Status: AC
Start: 2021-08-26 — End: 2021-08-26
  Administered 2021-08-26: 1000 mL via INTRAVENOUS

## 2021-08-26 MED ORDER — MAGNESIUM OXIDE -MG SUPPLEMENT 400 (240 MG) MG PO TABS
400.0000 mg | ORAL_TABLET | Freq: Every day | ORAL | Status: DC
  Administered 2021-08-27 – 2021-09-01 (×5): 400 mg via ORAL
  Filled 2021-08-26 (×6): qty 1

## 2021-08-26 MED ORDER — INSULIN ASPART 100 UNIT/ML IJ SOLN
0.0000 [IU] | Freq: Every day | INTRAMUSCULAR | Status: DC
  Administered 2021-08-30: 2 [IU] via SUBCUTANEOUS

## 2021-08-26 MED ORDER — POTASSIUM CHLORIDE CRYS ER 20 MEQ PO TBCR
40.0000 meq | EXTENDED_RELEASE_TABLET | Freq: Once | ORAL | Status: DC
  Filled 2021-08-26: qty 2

## 2021-08-26 MED ORDER — FOLIC ACID 1 MG PO TABS
1.0000 mg | ORAL_TABLET | Freq: Every day | ORAL | Status: DC
Start: 2021-08-27 — End: 2021-08-26

## 2021-08-26 MED ORDER — MORPHINE SULFATE (PF) 2 MG/ML IV SOLN
2.0000 mg | INTRAVENOUS | Status: DC | PRN
Start: 2021-08-26 — End: 2021-08-27

## 2021-08-26 MED ORDER — POTASSIUM CHLORIDE 2 MEQ/ML IV SOLN
INTRAVENOUS | Status: DC
  Filled 2021-08-26 (×2): qty 1000

## 2021-08-26 MED ORDER — LORAZEPAM 1 MG PO TABS: 1.0000 mg | ORAL_TABLET | ORAL | Status: AC | PRN

## 2021-08-26 MED ORDER — THIAMINE HCL 100 MG/ML IJ SOLN
100.0000 mg | Freq: Every day | INTRAMUSCULAR | Status: DC
  Filled 2021-08-26: qty 2

## 2021-08-26 MED ORDER — HEPARIN SODIUM (PORCINE) 5000 UNIT/ML IJ SOLN
5000.0000 [IU] | Freq: Three times a day (TID) | INTRAMUSCULAR | Status: DC
  Administered 2021-08-26 – 2021-08-27 (×3): 5000 [IU] via SUBCUTANEOUS
  Filled 2021-08-26 (×3): qty 1

## 2021-08-26 MED ORDER — FOLIC ACID 1 MG PO TABS
1.0000 mg | ORAL_TABLET | Freq: Every day | ORAL | Status: DC
  Administered 2021-08-27 – 2021-09-01 (×5): 1 mg via ORAL
  Filled 2021-08-26 (×6): qty 1

## 2021-08-26 MED ORDER — LORAZEPAM 2 MG/ML IJ SOLN: 1.0000 mg | INTRAMUSCULAR | Status: AC | PRN

## 2021-08-26 NOTE — ED Provider Notes (Signed)
[facility] EMERGENCY DEPARTMENT Provider Note   CSN: [id number] Arrival date & time: 08/26/21  1331     History Chief Complaint  Patient presents with   Hyperglycemia    Courtney Dyer is a 55 y.o. female.  HPI  Patient with significant medical history of diabetes type 2, gout, hyperlipidemia, obesity presents to the emergency department with chief complaint of nausea and generalized weakness.  She states that over the last couple days she has not been feeling well, states that she has had multiple episodes of nausea vomiting and diarrhea, she denies seeing any hematemesis, coffee-ground emesis, hematochezia or melena.  She states that she has been unable to tolerate p.o. as she becomes very nauseous.  She states that she has some epigastric tenderness, the pain does not radiate, she has no significant abdominal history, no history of PUD, diverticulitis, bowel obstruction, pancreatitis, she denies any history of NSAID use, alcohol use.  She has no other complaints, she is a poor historian and was difficult to obtain full history.  She does not endorse headaches, fevers, chills, shortness of breath, chest pain, worsening pedal edema.  After reviewing patient's charts patient has been seen in the past for similar presentation abdominal pain with  intractable nausea and vomiting, it was thought that she was having gastritis, imaging was unremarkable, no GI consult was obtained at that time.  she recovered with fluid resuscitation and was later sent home.  Patient has history of type 2 diabetes but looking through her chart it does not appear that she is taking diabetes medications, she states that she was taking a shot of some kind but is unsure what she was taking on the last time she had this.  She does not endorse increased thirst, or urinary frequency, has no other complaints.  Past Medical History:  Diagnosis Date   Diabetes mellitus    Gout    High cholesterol     Patient Active  Problem List   Diagnosis Date Noted   Intractable nausea and vomiting 08/26/2021   CKD (chronic kidney disease) stage 3, GFR 30-59 ml/min (HCC) 08/08/2021   Candidal intertrigo 08/06/2021   Adrenal mass ([place]) 08/06/2021   Intractable vomiting    AKI (acute kidney injury) ([place]) 04/23/2021   Hypokalemia 04/23/2021   Chronic anemia 04/23/2021   Hypomagnesemia 04/23/2021   High cholesterol     Past Surgical History:  Procedure Laterality Date   BREAST SURGERY     NEPHRECTOMY       OB History   No obstetric history on file.     Family History  Problem Relation Age of Onset   Hypertension Maternal Aunt     Social History   Tobacco Use   Smoking status: Every Day    Packs/day: 0.50    Types: Cigarettes   Smokeless tobacco: Never  Substance Use Topics   Alcohol use: Yes    Comment: daily   Drug use: No    Home Medications Prior to Admission medications   Medication Sig Start Date End Date Taking? Authorizing Provider  cefdinir (OMNICEF) 300 MG capsule Take 1 capsule (300 mg total) by mouth daily. 08/10/21  Yes Tat, Shanon Brow, MD  magnesium oxide (MAG-OX) 400 (240 Mg) MG tablet Take 1 tablet (400 mg total) by mouth daily. 08/09/21  Yes Tat, Shanon Brow, MD  folic acid (FOLVITE) 1 MG tablet Take 1 tablet (1 mg total) by mouth daily. 08/10/21   Orson Eva, MD    Allergies  Patient has no known allergies.  Review of Systems   Review of Systems  Constitutional:  Positive for fatigue. Negative for chills and fever.  HENT:  Negative for congestion.   Respiratory:  Negative for shortness of breath.   Cardiovascular:  Negative for chest pain.  Gastrointestinal:  Positive for abdominal pain, diarrhea, nausea and vomiting.  Genitourinary:  Negative for dysuria and enuresis.  Musculoskeletal:  Negative for back pain.  Skin:  Negative for rash.  Neurological:  Negative for dizziness and headaches.  Hematological:  Does not bruise/bleed easily.   Physical Exam Updated Vital  Signs BP 105/61   Pulse 93   Temp 98.5 F (36.9 C) (Oral)   Resp (!) 21   Ht '5\' 4"'  (1.626 m)   Wt 108.9 kg   LMP 09/26/2015   SpO2 100%   BMI 41.20 kg/m   Physical Exam Vitals and nursing note reviewed.  Constitutional:      General: She is not in acute distress.    Appearance: She is not ill-appearing.  HENT:     Head: Normocephalic and atraumatic.     Nose: No congestion.     Mouth/Throat:     Mouth: Mucous membranes are moist.     Pharynx: Oropharynx is clear. No oropharyngeal exudate or posterior oropharyngeal erythema.  Eyes:     Conjunctiva/sclera: Conjunctivae normal.  Cardiovascular:     Rate and Rhythm: Regular rhythm. Tachycardia present.     Pulses: Normal pulses.     Heart sounds: No murmur heard.   No friction rub. No gallop.  Pulmonary:     Effort: No respiratory distress.     Breath sounds: No wheezing, rhonchi or rales.  Abdominal:     Palpations: Abdomen is soft.     Tenderness: There is abdominal tenderness. There is no right CVA tenderness or left CVA tenderness.     Comments: Abdomen nondistended, normal bowel sounds, dull to percussion, she had noted epigastric tenderness, no right upper quadrant tenderness, no Murphy sign or McBurney point, negative guarding, rebound tenderness, peritoneal sign.  Musculoskeletal:     Right lower leg: No edema.     Left lower leg: No edema.  Skin:    General: Skin is warm and dry.  Neurological:     Mental Status: She is alert.     Comments: No facial asymmetry, able to follow two-step commands, no slurring of words, no difficult word finding, no unilateral weakness present.  Psychiatric:        Mood and Affect: Mood normal.    ED Results / Procedures / Treatments   Labs (all labs ordered are listed, but only abnormal results are displayed) Labs Reviewed  COMPREHENSIVE METABOLIC PANEL - Abnormal; Notable for the following components:      Result Value   Sodium 133 (*)    Potassium 2.4 (*)    Chloride 90  (*)    Glucose, Bld 206 (*)    BUN 21 (*)    Creatinine, Ser 2.11 (*)    Calcium 5.2 (*)    Total Protein 6.4 (*)    Albumin 2.1 (*)    GFR, Estimated 27 (*)    All other components within normal limits  CBC WITH DIFFERENTIAL/PLATELET - Abnormal; Notable for the following components:   WBC 15.4 (*)    RBC 3.21 (*)    Hemoglobin 8.5 (*)    HCT 25.6 (*)    MCV 79.8 (*)    Platelets 147 (*)  Neutro Abs 13.5 (*)    Abs Immature Granulocytes 0.16 (*)    All other components within normal limits  BLOOD GAS, VENOUS - Abnormal; Notable for the following components:   pO2, Ven 31.7 (*)    Bicarbonate 32.1 (*)    Acid-Base Excess 9.7 (*)    All other components within normal limits  MAGNESIUM - Abnormal; Notable for the following components:   Magnesium 0.7 (*)    All other components within normal limits  CBG MONITORING, ED - Abnormal; Notable for the following components:   Glucose-Capillary 210 (*)    All other components within normal limits  CBG MONITORING, ED - Abnormal; Notable for the following components:   Glucose-Capillary 194 (*)    All other components within normal limits  TROPONIN I (HIGH SENSITIVITY) - Abnormal; Notable for the following components:   Troponin I (High Sensitivity) 18 (*)    All other components within normal limits  RESP PANEL BY RT-PCR (FLU A&B, COVID) ARPGX2  LIPASE, BLOOD  URINALYSIS, ROUTINE W REFLEX MICROSCOPIC  TROPONIN I (HIGH SENSITIVITY)    EKG EKG Interpretation  Date/Time:  Wednesday August 26 2021 14:12:01 EDT Ventricular Rate:  106 PR Interval:  139 QRS Duration: 86 QT Interval:  366 QTC Calculation: 486 R Axis:   -2 Text Interpretation: Sinus tachycardia Probable left atrial enlargement Abnormal R-wave progression, early transition Abnormal T, consider ischemia, lateral leads new ischemic changes compared with prior 9/22 Confirmed by Aletta Edouard [phone]) on 08/26/2021 2:18:26 PM  Radiology CT ABDOMEN PELVIS WO  CONTRAST  Result Date: 08/26/2021 CLINICAL DATA:  Nausea, weakness, abdominal pain EXAM: CT ABDOMEN AND PELVIS WITHOUT CONTRAST TECHNIQUE: Multidetector CT imaging of the abdomen and pelvis was performed following the standard protocol without IV contrast. COMPARISON:  08/06/2021 FINDINGS: Lower chest: No acute pleural or parenchymal lung disease. Hepatobiliary: Multiple small gallstones are identified, with no evidence of acute cholecystitis. Unenhanced imaging of the liver is unremarkable. Pancreas: Unremarkable. No pancreatic ductal dilatation or surrounding inflammatory changes. Spleen: Normal in size without focal abnormality. Adrenals/Urinary Tract: Stable dysplastic right kidney. Scattered areas of cortical scarring are again seen within the left kidney, unchanged. No left-sided calculi or obstructive uropathy. Stable low-attenuation left adrenal nodule measuring 1.7 cm consistent with adenoma. The right adrenals unremarkable. Stomach/Bowel: No bowel obstruction or ileus. Normal appendix right lower quadrant. Scattered sigmoid diverticulosis without diverticulitis. No bowel wall thickening or inflammatory change. Vascular/Lymphatic: Aortic atherosclerosis. No enlarged abdominal or pelvic lymph nodes. Reproductive: Uterus and bilateral adnexa are unremarkable. Other: No free fluid or free gas. Stable small fat containing umbilical hernia. Musculoskeletal: No acute or destructive bony lesions. Reconstructed images demonstrate no additional findings. IMPRESSION: 1. Cholelithiasis without cholecystitis. 2. Minimal sigmoid diverticulosis without diverticulitis. 3. No bowel obstruction or ileus. 4. No urinary tract calculi or obstructive uropathy. Dysplastic right kidney unchanged. Electronically Signed   By: Randa Ngo M.D.   On: 08/26/2021 18:35   DG Chest Port 1 View  Result Date: 08/26/2021 CLINICAL DATA:  Nausea and generalized weakness. EXAM: PORTABLE CHEST 1 VIEW COMPARISON:  August 06, 2021  FINDINGS: Low lung volumes are seen. There is no evidence of acute infiltrate, pleural effusion or pneumothorax. The heart size and mediastinal contours are within normal limits. Stable chronic and degenerative changes seen throughout the thoracic spine and bilateral shoulders. IMPRESSION: Low lung volumes without acute or active cardiopulmonary disease. Electronically Signed   By: Virgina Norfolk M.D.   On: 08/26/2021 15:25    Procedures .Critical Care E&M  Performed by: Marcello Fennel, PA-C  Critical care provider statement:    Critical care time (minutes):  45   Critical care time was exclusive of:  Separately billable procedures and treating other patients   Critical care was necessary to treat or prevent imminent or life-threatening deterioration of the following conditions:  Metabolic crisis   Critical care was time spent personally by me on the following activities:  Blood draw for specimens, ordering and performing treatments and interventions, ordering and review of laboratory studies, ordering and review of radiographic studies, re-evaluation of patient's condition, review of old charts, examination of patient and evaluation of patient's response to treatment   I assumed direction of critical care for this patient from another provider in my specialty: no     Care discussed with: admitting provider   After initial E/M assessment, critical care services were subsequently performed that were exclusive of separately billable procedures or treatment.     Medications Ordered in ED Medications  potassium chloride 10 mEq in 100 mL IVPB (10 mEq Intravenous New Bag/Given 08/26/21 1825)  potassium chloride SA (KLOR-CON) CR tablet 40 mEq (40 mEq Oral Patient Refused/Not Given 08/26/21 1651)  calcium gluconate 1 g/ 50 mL sodium chloride IVPB (0 mg Intravenous Stopped 08/26/21 1821)  sodium chloride 0.9 % bolus 1,000 mL (1,000 mLs Intravenous New Bag/Given 08/26/21 1714)  magnesium sulfate  IVPB 2 g 50 mL (2 g Intravenous New Bag/Given 08/26/21 1822)  ondansetron (ZOFRAN) injection 4 mg (4 mg Intravenous Given 08/26/21 1823)    ED Course  I have reviewed the triage vital signs and the nursing notes.  Pertinent labs & imaging results that were available during my care of the patient were reviewed by me and considered in my medical decision making (see chart for details).  Clinical Course as of 08/26/21 1924  Wed Oct 12, 543  5150 55 year old female brought in from home for evaluation of elevated blood sugar.  Family member here said she has not had any medicine since she was recently discharged.  Patient unable to provide much history.  Getting labs imaging fluids.  Anticipate will need admission. [MB]    Clinical Course User Index [MB] Hayden Rasmussen, MD   MDM Rules/Calculators/A&P                          Initial impression-patient presents with generalized weakness.  She is alert, does not appear in acute stress, vital signs are for tachycardia.  I am concerned for possible DKA due to her noncompliance with diabetes medication will obtain basic lab work-up and reassess.  Work-up-CBC shows leukocytosis of 15.4, normocytic anemia hemoglobin 8.5 at baseline for patient, CMP shows hypokalemia of 2.4, glucose of 206, BUN 21, creatinine 2.11 appears to be at baseline, calcium 5.2, GFR 27, magnesium 0.7, for troponin is 14, venous blood gas shows O2 31, bicarb 32, respiratory panel unremarkable.  CT scan reveals Coley lithiasis cholecystitis, minimal sigmoid diverticulosis without diverticulitis no bowel obstruction or ileus no urinary calcaneus or obstruction present.  Reassessment-noted that patient has severe hypokalemia and low calcium, likely this is secondary due to consistent nausea vomiting diarrhea, will give her p.o. and IV of potassium, obtain magnesium, also give her a gram of calcium and reassess.  Magnesium is critical at 0.7, will provide patient with IV  magnesium, patient still tender on my exam the epigastric region, will obtain CT scan without contrast due to her GFR for further evaluation.  Patient was reassessed states she feels unchanged, vital signs remained stable, will recommend admission due to severe electrolyte derailments patient agreement this plan.  Will consult hospitalist team.  Noted the patient has a distended bladder on CT imaging, patient she has been able to urinate difficult, no history of bladder obstruction, bladder scan was performed shows that she has 500 cc of fluids will In-N-Out catheter patient.  Consult-spoke with Dr. Nori Riis who will admit the patient.  Rule out-I have low suspicion for DKA or HHS as patient's pH is within normal limits, bicarb within normal limits, no significant increase in her glucose, no anion gap present.  I have low suspicion for liver or gallbladder abnormality as she has no right upper quadrant tenderness, liver enzymes, alk phos, T bili all within normal limits.  I have low suspicion for PUD and she has low risk factors for this, no peritoneal sign present my exam, hemoglobin is stable at 8.5.  I have low suspicion for complicated diverticulitis as presentation is atypical of etiology, she has no history of diverticulosis.  Low suspicion for systemic infection as patient nontoxic-appearing, vital signs are reassuring.  She does have leukocytosis though slightly tachycardic but I suspect this is likely secondary due to dehydration from inability to tolerate p.o..  I have low suspicion for ACS presentation is atypical, patient has no cardiac history,  EKG no ST elevation present, she does have abnormal T waves which is new from prior EKGs with a slightly uptrending troponin of 18 from 14, I suspect this is likely secondary due to poor excretion as she has an AKI, EKG changes could be explained by electrolyte derailments, recommend continue monitoring.  Plan-patient has known severe electrolyte  abnormalities likely secondary due to nausea vomiting diarrhea, will need electrolyte replenishment and further work-up possible GI consult to determine cause of nausea vomiting diarrhea.    Final Clinical Impression(s) / ED Diagnoses Final diagnoses:  Hypokalemia  Hypomagnesemia  Low calcium levels    Rx / DC Orders ED Discharge Orders     None        Marcello Fennel, PA-C 08/26/21 1924    Hayden Rasmussen, MD 08/26/21 1940

## 2021-08-26 NOTE — ED Notes (Signed)
In and out catheter done with 775m urine return.

## 2021-08-26 NOTE — ED Triage Notes (Signed)
Pt presents to ED via [facility] for nausea and generalized weakness. Pt is diabetic, not had insulin for a while. Had episode where she has went unconscious for brief period of time per family. CBG 331

## 2021-08-26 NOTE — ED Notes (Signed)
Pt stated that she did not want to put a gown on.

## 2021-08-26 NOTE — H&P (Signed)
TRH H&P    Patient Demographics:    Courtney Dyer, is a 55 y.o. female  MRN: [id number]  DOB - 04-24-1966  Admit Date - 08/26/2021  Referring MD/NP/PA: Ileene Patrick  Outpatient Primary MD for the patient is Pcp, No  Patient coming from: Home - lives with aunt  Chief complaint- Generalized weakness   HPI:    Courtney Dyer  is a 55 y.o. female, with history of DMII, HLD, gout, and more presents to the ED with a chief complaint of generalized weakness.  Patient reports that this has been going on for about a week.  Today she was so weak she could not walk.  Patient normally ambulates with a cane.  She reports that sitting in her chair she felt woozy.  She admits to chronic nausea and vomiting, the last episode of emesis being yesterday.  Emesis is nonbloody.  Patient reports normal appetite.  Patient denies any chest pain, shortness of breath, cough.  She admits to epigastric abdominal pain that feels sharp.  Nothing makes it worse.  It was improved but not relieved with Pepto-Bismol.  Patient has no dysuria, and has noticed no change in urine output.  Patient does report chronic loose stools as well.  Her last bowel movement was day before yesterday, liquid, brown.  Having a bowel movement does make her abdominal pain better.  The nausea, and loose stools have been present on and off for 2 weeks.  The belly pain has been present for 1 week.  The weakness has been progressively worse for an unknown amount of time, but acutely worse today.  The only other complaint patient has is that her glucose was high in the 300s today.  This was postprandial.  Last hemoglobin A1c was 5.9, so 300 would be quite high for her.  Patient quit smoking and quit drinking 1 week ago.  She reports that up until then she was drinking 2 bottles of beer per day.  She has not had any tremors, hallucinations, seizures.  She reports that she does not  use illicit drugs.  She is vaccinated for COVID.  Patient is full code.  In the ED Temp 98.5, heart rate 93-108, respiratory rate 16-22, blood pressure 105/61, satting at 100% VBG shows normal pH at 7.424 There is a leukocytosis of 15.4 platelets 147 Slight hyponatremia 133, hypokalemia 2.4, hypochloremia at 90, bicarb 28, BUN is 21, creatinine 2.11-at baseline, glucose 206 Magnesium 0.7, calcium 5.2 which is down from 8.0 at last admission Negative respiratory panel Troponin stable at 14 and 18 Lipase 20 CT abdomen shows cholelithiasis without cholecystitis, diverticulosis without diverticulitis, no bowel obstruction, no obstructing calculi, but does show dysplastic right kidney that is unchanged from before. Patient was given 1 L of normal saline, 40 mEq p.o. potassium, 30 mEq IV potassium, Zofran, magnesium 2 g, calcium gluconate 1 g, bladder scan showed 700 mL of urine, in and out cath done.    Review of systems:    In addition to the HPI above,  No Fever-chills, No Headache, No changes  with Vision or hearing, No problems swallowing food or Liquids, No Chest pain, Cough or Shortness of Breath, Admits to abdominal pain, nausea, vomiting, loose stools, No Blood in stool or Urine, No dysuria, No new skin rashes or bruises, No new joints pains-aches,  No new weakness, tingling, numbness in any extremity, No recent weight gain or loss, No polyuria, polydypsia or polyphagia, No significant Mental Stressors.  All other systems reviewed and are negative.    Past History of the following :    Past Medical History:  Diagnosis Date   Diabetes mellitus    Gout    High cholesterol       Past Surgical History:  Procedure Laterality Date   BREAST SURGERY     NEPHRECTOMY        Social History:      Social History   Tobacco Use   Smoking status: Every Day    Packs/day: 0.50    Types: Cigarettes   Smokeless tobacco: Never  Substance Use Topics   Alcohol use: Yes     Comment: daily       Family History :     Family History  Problem Relation Age of Onset   Hypertension Maternal Aunt       Home Medications:   Prior to Admission medications   Medication Sig Start Date End Date Taking? Authorizing Provider  cefdinir (OMNICEF) 300 MG capsule Take 1 capsule (300 mg total) by mouth daily. 08/10/21  Yes Tat, Shanon Brow, MD  magnesium oxide (MAG-OX) 400 (240 Mg) MG tablet Take 1 tablet (400 mg total) by mouth daily. 08/09/21  Yes Tat, Shanon Brow, MD  folic acid (FOLVITE) 1 MG tablet Take 1 tablet (1 mg total) by mouth daily. 08/10/21   Orson Eva, MD     Allergies:    No Known Allergies   Physical Exam:   Vitals  Blood pressure 105/61, pulse 93, temperature 98.5 F (36.9 C), temperature source Oral, resp. rate (!) 21, height '5\' 4"'$  (1.626 m), weight 108.9 kg, last menstrual period 09/26/2015, SpO2 100 %.  1.  General: Patient lying supine in bed,  no acute distress   2. Psychiatric: Alert and oriented x 3, mood and behavior normal for situation, pleasant and cooperative with exam   3. Neurologic: Speech and language are normal, face is symmetric, moves all 4 extremities voluntarily, at baseline without acute deficits on limited exam   4. HEENMT:  Head is atraumatic, normocephalic, pupils reactive to light, neck is supple, trachea is midline, mucous membranes are moist   5. Respiratory : Lungs are clear to auscultation bilaterally without wheezing, rhonchi, rales, no cyanosis, no increase in work of breathing or accessory muscle use   6. Cardiovascular : Heart rate normal, rhythm is regular, no murmurs, rubs or gallops, no peripheral edema, peripheral pulses palpated   7. Gastrointestinal:  Abdomen is soft, nondistended, tender to palpation in the epigastric region without guarding, negative Murphy sign, bowel sounds active, no masses or organomegaly palpated   8. Skin:  Skin is warm, dry and intact without rashes, acute lesions, or ulcers on  limited exam   9.Musculoskeletal:  No acute deformities or trauma, no asymmetry in tone, no peripheral edema, peripheral pulses palpated, no tenderness to palpation in the extremities     Data Review:    CBC Recent Labs  Lab 08/26/21 1503  WBC 15.4*  HGB 8.5*  HCT 25.6*  PLT 147*  MCV 79.8*  MCH 26.5  MCHC 33.2  RDW 14.6  LYMPHSABS 1.1  MONOABS 0.6  EOSABS 0.0  BASOSABS 0.0   ------------------------------------------------------------------------------------------------------------------  Results for orders placed or performed during the hospital encounter of 08/26/21 (from the past 48 hour(s))  CBG monitoring, ED     Status: Abnormal   Collection Time: 08/26/21  3:02 PM  Result Value Ref Range   Glucose-Capillary 210 (H) 70 - 99 mg/dL    Comment: Glucose reference range applies only to samples taken after fasting for at least 8 hours.  Comprehensive metabolic panel     Status: Abnormal   Collection Time: 08/26/21  3:03 PM  Result Value Ref Range   Sodium 133 (L) 135 - 145 mmol/L   Potassium 2.4 (LL) 3.5 - 5.1 mmol/L    Comment: CRITICAL RESULT CALLED TO, READ BACK BY AND VERIFIED WITH: SITES,K ON 08/26/21 AT 1600 BY LOY,C    Chloride 90 (L) 98 - 111 mmol/L   CO2 28 22 - 32 mmol/L   Glucose, Bld 206 (H) 70 - 99 mg/dL    Comment: Glucose reference range applies only to samples taken after fasting for at least 8 hours.   BUN 21 (H) 6 - 20 mg/dL   Creatinine, Ser 2.11 (H) 0.44 - 1.00 mg/dL   Calcium 5.2 (LL) 8.9 - 10.3 mg/dL    Comment: CRITICAL RESULT CALLED TO, READ BACK BY AND VERIFIED WITH: SITES,K ON 08/26/21 AT 1600 BY LOY,C    Total Protein 6.4 (L) 6.5 - 8.1 g/dL   Albumin 2.1 (L) 3.5 - 5.0 g/dL   AST 18 15 - 41 U/L   ALT 6 0 - 44 U/L   Alkaline Phosphatase 74 38 - 126 U/L   Total Bilirubin 0.8 0.3 - 1.2 mg/dL   GFR, Estimated 27 (L) >60 mL/min    Comment: (NOTE) Calculated using the CKD-EPI Creatinine Equation (2021)    Anion gap 15 5 - 15     Comment: Performed at [facility], [address] [place],  [address]  CBC with Differential     Status: Abnormal   Collection Time: 08/26/21  3:03 PM  Result Value Ref Range   WBC 15.4 (H) 4.0 - 10.5 K/uL   RBC 3.21 (L) 3.87 - 5.11 MIL/uL   Hemoglobin 8.5 (L) 12.0 - 15.0 g/dL   HCT 25.6 (L) 36.0 - 46.0 %   MCV 79.8 (L) 80.0 - 100.0 fL   MCH 26.5 26.0 - 34.0 pg   MCHC 33.2 30.0 - 36.0 g/dL   RDW 14.6 11.5 - 15.5 %   Platelets 147 (L) 150 - 400 K/uL    Comment: SPECIMEN CHECKED FOR CLOTS REPEATED TO VERIFY PLATELET COUNT CONFIRMED BY SMEAR    nRBC 0.0 0.0 - 0.2 %   Neutrophils Relative % 88 %   Neutro Abs 13.5 (H) 1.7 - 7.7 K/uL   Lymphocytes Relative 7 %   Lymphs Abs 1.1 0.7 - 4.0 K/uL   Monocytes Relative 4 %   Monocytes Absolute 0.6 0.1 - 1.0 K/uL   Eosinophils Relative 0 %   Eosinophils Absolute 0.0 0.0 - 0.5 K/uL   Basophils Relative 0 %   Basophils Absolute 0.0 0.0 - 0.1 K/uL   Smear Review NO PLATELET CLUMPS SEEN BF    Immature Granulocytes 1 %   Abs Immature Granulocytes 0.16 (H) 0.00 - 0.07 K/uL    Comment: Performed at [facility], [address] [place], [place] [address]  Troponin I (High Sensitivity)     Status: None   Collection Time:  08/26/21  3:03 PM  Result Value Ref Range   Troponin I (High Sensitivity) 14 <18 ng/L    Comment: (NOTE) Elevated high sensitivity troponin I (hsTnI) values and significant  changes across serial measurements may suggest ACS but many other  chronic and acute conditions are known to elevate hsTnI results.  Refer to the Links section for chest pain algorithms and additional  guidance. Performed at [facility], [address], [place], [place] [address]   Magnesium     Status: Abnormal   Collection Time: 08/26/21  3:03 PM  Result Value Ref Range   Magnesium 0.7 (LL) 1.7 - 2.4 mg/dL    Comment: CRITICAL RESULT CALLED TO, READ BACK BY AND VERIFIED WITH: SITES,K ON 08/26/21 AT 1650 BY LOY,C Performed at [facility], [address], [place], [place] [address]   CBG monitoring, ED     Status: Abnormal   Collection Time: 08/26/21  3:14 PM  Result Value Ref Range   Glucose-Capillary 194 (H) 70 - 99 mg/dL    Comment: Glucose reference range applies only to samples taken after fasting for at least 8 hours.  Blood gas, venous (at [facility] and [facility], not at [facility])     Status: Abnormal   Collection Time: 08/26/21  3:55 PM  Result Value Ref Range   FIO2 21.00    pH, Ven 7.424 7.250 - 7.430   pCO2, Ven 53.5 44.0 - 60.0 mmHg   pO2, Ven 31.7 (LL) 32.0 - 45.0 mmHg    Comment: CRITICAL RESULT CALLED TO, READ BACK BY AND VERIFIED WITH: CRAWFORD,H ON 08/26/21 AT 1620 BY LOY,C    Bicarbonate 32.1 (H) 20.0 - 28.0 mmol/L   Acid-Base Excess 9.7 (H) 0.0 - 2.0 mmol/L   O2 Saturation 48.0 %   Patient temperature 36.8     Comment: Performed at [facility], [address] [place], [place] [address]  Resp Panel by RT-PCR (Flu A&B, Covid) Nasopharyngeal Swab     Status: None   Collection Time: 08/26/21  4:39 PM   Specimen: Nasopharyngeal Swab; Nasopharyngeal(NP) swabs in vial transport medium  Result Value Ref Range   SARS Coronavirus 2 by RT PCR NEGATIVE NEGATIVE    Comment: (NOTE) SARS-CoV-2 target nucleic acids are NOT DETECTED.  The SARS-CoV-2 RNA is generally detectable in upper respiratory specimens during the acute phase of infection. The lowest concentration of SARS-CoV-2 viral copies this assay can detect is 138 copies/mL. A negative result does not preclude SARS-Cov-2 infection and should not be used as the sole basis for treatment or other patient management decisions. A negative result may occur with  improper specimen collection/handling, submission of specimen other than nasopharyngeal swab, presence of viral mutation(s) within the areas targeted by this assay, and inadequate number of viral copies(<138 copies/mL). A negative result must be combined with clinical observations, patient history, and  epidemiological information. The expected result is Negative.  Fact Sheet for Patients:  EntrepreneurPulse.com.au  Fact Sheet for Healthcare Providers:  IncredibleEmployment.be  This test is no t yet approved or cleared by the [place] FDA and  has been authorized for detection and/or diagnosis of SARS-CoV-2 by FDA under an Emergency Use Authorization (EUA). This EUA will remain  in effect (meaning this test can be used) for the duration of the COVID-19 declaration under Section 564(b)(1) of the Act, 21 U.S.C.section 360bbb-3(b)(1), unless the authorization is terminated  or revoked sooner.       Influenza A by PCR NEGATIVE NEGATIVE   Influenza B  by PCR NEGATIVE NEGATIVE    Comment: (NOTE) The Xpert Xpress SARS-CoV-2/FLU/RSV plus assay is intended as an aid in the diagnosis of influenza from Nasopharyngeal swab specimens and should not be used as a sole basis for treatment. Nasal washings and aspirates are unacceptable for Xpert Xpress SARS-CoV-2/FLU/RSV testing.  Fact Sheet for Patients: EntrepreneurPulse.com.au  Fact Sheet for Healthcare Providers: IncredibleEmployment.be  This test is not yet approved or cleared by the [place] FDA and has been authorized for detection and/or diagnosis of SARS-CoV-2 by FDA under an Emergency Use Authorization (EUA). This EUA will remain in effect (meaning this test can be used) for the duration of the COVID-19 declaration under Section 564(b)(1) of the Act, 21 U.S.C. section 360bbb-3(b)(1), unless the authorization is terminated or revoked.  Performed at [facility], [address] [place], [place] [address]   Troponin I (High Sensitivity)     Status: Abnormal   Collection Time: 08/26/21  5:36 PM  Result Value Ref Range   Troponin I (High Sensitivity) 18 (H) <18 ng/L    Comment: (NOTE) Elevated high sensitivity troponin I (hsTnI) values and significant   changes across serial measurements may suggest ACS but many other  chronic and acute conditions are known to elevate hsTnI results.  Refer to the "Links" section for chest pain algorithms and additional  guidance. Performed at [facility], [address] [place], [place] [address]   Lipase, blood     Status: None   Collection Time: 08/26/21  5:48 PM  Result Value Ref Range   Lipase 20 11 - 51 U/L    Comment: Performed at [facility], [address] [place], [place] [address]    Chemistries  Recent Labs  Lab 08/26/21 1503  NA 133*  K 2.4*  CL 90*  CO2 28  GLUCOSE 206*  BUN 21*  CREATININE 2.11*  CALCIUM 5.2*  MG 0.7*  AST 18  ALT 6  ALKPHOS 74  BILITOT 0.8   ------------------------------------------------------------------------------------------------------------------  ------------------------------------------------------------------------------------------------------------------ GFR: Estimated Creatinine Clearance: 37.2 mL/min (A) (by C-G formula based on SCr of 2.11 mg/dL (H)). Liver Function Tests: Recent Labs  Lab 08/26/21 1503  AST 18  ALT 6  ALKPHOS 74  BILITOT 0.8  PROT 6.4*  ALBUMIN 2.1*   Recent Labs  Lab 08/26/21 1748  LIPASE 20   No results for input(s): AMMONIA in the last 168 hours. Coagulation Profile: No results for input(s): INR, PROTIME in the last 168 hours. Cardiac Enzymes: No results for input(s): CKTOTAL, CKMB, CKMBINDEX, TROPONINI in the last 168 hours. BNP (last 3 results) No results for input(s): PROBNP in the last 8760 hours. HbA1C: No results for input(s): HGBA1C in the last 72 hours. CBG: Recent Labs  Lab 08/26/21 1502 08/26/21 1514  GLUCAP 210* 194*   Lipid Profile: No results for input(s): CHOL, HDL, LDLCALC, TRIG, CHOLHDL, LDLDIRECT in the last 72 hours. Thyroid Function Tests: No results for input(s): TSH, T4TOTAL, FREET4, T3FREE, THYROIDAB in the last 72 hours. Anemia Panel: No results for input(s):  VITAMINB12, FOLATE, FERRITIN, TIBC, IRON, RETICCTPCT in the last 72 hours.  --------------------------------------------------------------------------------------------------------------- Urine analysis:    Component Value Date/Time   COLORURINE YELLOW 08/06/2021 1553   APPEARANCEUR TURBID (A) 08/06/2021 1553   LABSPEC 1.014 08/06/2021 1553   PHURINE 5.0 08/06/2021 1553   GLUCOSEU NEGATIVE 08/06/2021 1553   HGBUR SMALL (A) 08/06/2021 1553   BILIRUBINUR NEGATIVE 08/06/2021 1553   KETONESUR 5 (A) 08/06/2021 1553   PROTEINUR 30 (A) 08/06/2021 1553   NITRITE  NEGATIVE 08/06/2021 1553   LEUKOCYTESUR MODERATE (A) 08/06/2021 1553      Imaging Results:    CT ABDOMEN PELVIS WO CONTRAST  Result Date: 08/26/2021 CLINICAL DATA:  Nausea, weakness, abdominal pain EXAM: CT ABDOMEN AND PELVIS WITHOUT CONTRAST TECHNIQUE: Multidetector CT imaging of the abdomen and pelvis was performed following the standard protocol without IV contrast. COMPARISON:  08/06/2021 FINDINGS: Lower chest: No acute pleural or parenchymal lung disease. Hepatobiliary: Multiple small gallstones are identified, with no evidence of acute cholecystitis. Unenhanced imaging of the liver is unremarkable. Pancreas: Unremarkable. No pancreatic ductal dilatation or surrounding inflammatory changes. Spleen: Normal in size without focal abnormality. Adrenals/Urinary Tract: Stable dysplastic right kidney. Scattered areas of cortical scarring are again seen within the left kidney, unchanged. No left-sided calculi or obstructive uropathy. Stable low-attenuation left adrenal nodule measuring 1.7 cm consistent with adenoma. The right adrenals unremarkable. Stomach/Bowel: No bowel obstruction or ileus. Normal appendix right lower quadrant. Scattered sigmoid diverticulosis without diverticulitis. No bowel wall thickening or inflammatory change. Vascular/Lymphatic: Aortic atherosclerosis. No enlarged abdominal or pelvic lymph nodes. Reproductive: Uterus  and bilateral adnexa are unremarkable. Other: No free fluid or free gas. Stable small fat containing umbilical hernia. Musculoskeletal: No acute or destructive bony lesions. Reconstructed images demonstrate no additional findings. IMPRESSION: 1. Cholelithiasis without cholecystitis. 2. Minimal sigmoid diverticulosis without diverticulitis. 3. No bowel obstruction or ileus. 4. No urinary tract calculi or obstructive uropathy. Dysplastic right kidney unchanged. Electronically Signed   By: Randa Ngo M.D.   On: 08/26/2021 18:35   DG Chest Port 1 View  Result Date: 08/26/2021 CLINICAL DATA:  Nausea and generalized weakness. EXAM: PORTABLE CHEST 1 VIEW COMPARISON:  August 06, 2021 FINDINGS: Low lung volumes are seen. There is no evidence of acute infiltrate, pleural effusion or pneumothorax. The heart size and mediastinal contours are within normal limits. Stable chronic and degenerative changes seen throughout the thoracic spine and bilateral shoulders. IMPRESSION: Low lung volumes without acute or active cardiopulmonary disease. Electronically Signed   By: Virgina Norfolk M.D.   On: 08/26/2021 15:25    My personal review of EKG: Rhythm sinus tachycardia with a rate of 106/min, nonspecific repolarization abnormality-we will monitor on telemetry   Assessment & Plan:    Active Problems:   Hypokalemia   Hypomagnesemia   Intractable nausea and vomiting   Hypocalcemia   Generalized weakness   Alcohol abuse   Acute urinary retention   Intractable nausea and vomiting Etiology is unclear at this point Patient denies any marijuana use Same complaint during the admission the end of September -attributed to acute gastroenteritis at that time Alcohol withdrawal could be playing a role in the patient reports quitting 1 week ago CT abdomen pelvis does not show any acute findings, but does redemonstrate dysplastic right kidney Continue Zofran, and clear liquid diet Hypokalemia, hypomagnesemia,  hyponatremia, hypocalcemia Potassium 2.4; 70 mEq of potassium given in the ED Continue LR with potassium additive Magnesium repleted with 2 g IV magnesium in the ED Sodium corrects for glucose to 136 Dysplastic kidney-renal ultrasound Alcohol abuse with likely also contributing Calcium repleted with 1 g calcium gluconate in the ED Continue to monitor on telemetry Generalized weakness Secondary to electrolyte derangements Alcohol abuse Patient reports drinking 2 bottles of beer per day every day She has been trying to quit for 1 week, no history of Dts CIWA protocol Acute urinary retention Get UA with reflx In and out cath returned 796m in ED If patient requires another in and out, consider foley  Nonobstructing calculi on CT Leukocytosis  Possible UTI, UA pending Possibly just an acute phase reactant Will monitor for UA, and urine culture/start antibiotics as indicated Diabetes mellitus type 2 Sliding scale coverage Update hemoglobin A1c with a.m. labs Currently clear liquid diet but advance diet to carb modified as tolerated    DVT Prophylaxis-   Heparin- SCDs   AM Labs Ordered, also please review Full Orders  Family Communication: No family at bedside  Code Status: Full  Admission status: Inpatient :The appropriate admission status for this patient is INPATIENT. Inpatient status is judged to be reasonable and necessary in order to provide the required intensity of service to ensure the patient's safety. The patient's presenting symptoms, physical exam findings, and initial radiographic and laboratory data in the context of their chronic comorbidities is felt to place them at high risk for further clinical deterioration. Furthermore, it is not anticipated that the patient will be medically stable for discharge from the hospital within 2 midnights of admission. The following factors support the admission status of inpatient.     The patient's presenting symptoms include  generalized weakness. The worrisome physical exam findings include epigastric abdominal pain. The initial radiographic and laboratory data are worrisome because of electrolyte derangements. The chronic co-morbidities include diabetes mellitus type 2, hyperlipidemia, gout.       * I certify that at the point of admission it is my clinical judgment that the patient will require inpatient hospital care spanning beyond 2 midnights from the point of admission due to high intensity of service, high risk for further deterioration and high frequency of surveillance required.*  Time spent in minutes : [place]

## 2021-08-27 ENCOUNTER — Inpatient Hospital Stay (HOSPITAL_COMMUNITY): Payer: Medicare Other

## 2021-08-27 DIAGNOSIS — R531 Weakness: Secondary | ICD-10-CM | POA: Diagnosis not present

## 2021-08-27 DIAGNOSIS — F1010 Alcohol abuse, uncomplicated: Secondary | ICD-10-CM | POA: Diagnosis not present

## 2021-08-27 DIAGNOSIS — R338 Other retention of urine: Secondary | ICD-10-CM | POA: Diagnosis not present

## 2021-08-27 DIAGNOSIS — E876 Hypokalemia: Secondary | ICD-10-CM | POA: Diagnosis not present

## 2021-08-27 DIAGNOSIS — K922 Gastrointestinal hemorrhage, unspecified: Secondary | ICD-10-CM

## 2021-08-27 DIAGNOSIS — D62 Acute posthemorrhagic anemia: Secondary | ICD-10-CM

## 2021-08-27 LAB — COMPREHENSIVE METABOLIC PANEL
ALT: 7 U/L (ref 0–44)
AST: 12 U/L — ABNORMAL LOW (ref 15–41)
Albumin: 1.8 g/dL — ABNORMAL LOW (ref 3.5–5.0)
Alkaline Phosphatase: 61 U/L (ref 38–126)
Anion gap: 11 (ref 5–15)
BUN: 20 mg/dL (ref 6–20)
CO2: 27 mmol/L (ref 22–32)
Calcium: 5.3 mg/dL — CL (ref 8.9–10.3)
Chloride: 95 mmol/L — ABNORMAL LOW (ref 98–111)
Creatinine, Ser: 1.92 mg/dL — ABNORMAL HIGH (ref 0.44–1.00)
GFR, Estimated: 31 mL/min — ABNORMAL LOW (ref >60)
Glucose, Bld: 107 mg/dL — ABNORMAL HIGH (ref 70–99)
Potassium: 2.4 mmol/L — CL (ref 3.5–5.1)
Sodium: 133 mmol/L — ABNORMAL LOW (ref 135–145)
Total Bilirubin: 0.9 mg/dL (ref 0.3–1.2)
Total Protein: 5.6 g/dL — ABNORMAL LOW (ref 6.5–8.1)

## 2021-08-27 LAB — GLUCOSE, CAPILLARY
Glucose-Capillary: 100 mg/dL — ABNORMAL HIGH (ref 70–99)
Glucose-Capillary: 195 mg/dL — ABNORMAL HIGH (ref 70–99)
Glucose-Capillary: 201 mg/dL — ABNORMAL HIGH (ref 70–99)
Glucose-Capillary: 125 mg/dL — ABNORMAL HIGH (ref 70–99)

## 2021-08-27 LAB — CBC WITH DIFFERENTIAL/PLATELET
Abs Immature Granulocytes: 0.1 10*3/uL — ABNORMAL HIGH (ref 0.00–0.07)
Basophils Absolute: 0 10*3/uL (ref 0.0–0.1)
Basophils Relative: 0 %
Eosinophils Absolute: 0.1 10*3/uL (ref 0.0–0.5)
Eosinophils Relative: 1 %
HCT: 21.1 % — ABNORMAL LOW (ref 36.0–46.0)
Hemoglobin: 6.7 g/dL — CL (ref 12.0–15.0)
Immature Granulocytes: 1 %
Lymphocytes Relative: 16 %
Lymphs Abs: 1.8 10*3/uL (ref 0.7–4.0)
MCH: 25.8 pg — ABNORMAL LOW (ref 26.0–34.0)
MCHC: 31.8 g/dL (ref 30.0–36.0)
MCV: 81.2 fL (ref 80.0–100.0)
Monocytes Absolute: 0.4 10*3/uL (ref 0.1–1.0)
Monocytes Relative: 4 %
Neutro Abs: 9.2 10*3/uL — ABNORMAL HIGH (ref 1.7–7.7)
Neutrophils Relative %: 78 %
Platelets: 121 10*3/uL — ABNORMAL LOW (ref 150–400)
RBC: 2.6 MIL/uL — ABNORMAL LOW (ref 3.87–5.11)
RDW: 14.6 % (ref 11.5–15.5)
WBC: 11.7 10*3/uL — ABNORMAL HIGH (ref 4.0–10.5)
nRBC: 0 % (ref 0.0–0.2)

## 2021-08-27 LAB — PREPARE RBC (CROSSMATCH)

## 2021-08-27 LAB — HEMOGLOBIN A1C
Hgb A1c MFr Bld: 6.3 % — ABNORMAL HIGH (ref 4.8–5.6)
Mean Plasma Glucose: 134.11 mg/dL

## 2021-08-27 LAB — MAGNESIUM: Magnesium: 1.2 mg/dL — ABNORMAL LOW (ref 1.7–2.4)

## 2021-08-27 LAB — TROPONIN I (HIGH SENSITIVITY): Troponin I (High Sensitivity): 18 ng/L — ABNORMAL HIGH (ref <18)

## 2021-08-27 LAB — ABO/RH: ABO/RH(D): A POS

## 2021-08-27 LAB — TSH: TSH: 0.858 u[IU]/mL (ref 0.350–4.500)

## 2021-08-27 MED ORDER — SODIUM CHLORIDE 0.9% IV SOLUTION: Freq: Once | INTRAVENOUS | Status: AC

## 2021-08-27 MED ORDER — PANTOPRAZOLE SODIUM 40 MG IV SOLR
40.0000 mg | Freq: Two times a day (BID) | INTRAVENOUS | Status: DC
  Administered 2021-08-27 – 2021-08-31 (×9): 40 mg via INTRAVENOUS
  Filled 2021-08-27 (×9): qty 40

## 2021-08-27 MED ORDER — CALCIUM CARBONATE ANTACID 500 MG PO CHEW
1.0000 | CHEWABLE_TABLET | Freq: Three times a day (TID) | ORAL | Status: DC
  Administered 2021-08-27 – 2021-09-01 (×13): 200 mg via ORAL
  Filled 2021-08-27 (×15): qty 1

## 2021-08-27 MED ORDER — HEPARIN SODIUM (PORCINE) 5000 UNIT/ML IJ SOLN: 5000.0000 [IU] | Freq: Three times a day (TID) | INTRAMUSCULAR | Status: DC

## 2021-08-27 MED ORDER — POTASSIUM CHLORIDE 10 MEQ/100ML IV SOLN
10.0000 meq | INTRAVENOUS | Status: AC
  Administered 2021-08-27 (×4): 10 meq via INTRAVENOUS
  Filled 2021-08-27 (×4): qty 100

## 2021-08-27 MED ORDER — PROSOURCE PLUS PO LIQD
30.0000 mL | Freq: Three times a day (TID) | ORAL | Status: DC
  Administered 2021-08-27 – 2021-09-01 (×11): 30 mL via ORAL
  Filled 2021-08-27 (×12): qty 30

## 2021-08-27 MED ORDER — POTASSIUM CHLORIDE 10 MEQ/100ML IV SOLN
10.0000 meq | INTRAVENOUS | Status: AC
Start: 2021-08-27 — End: 2021-08-27
  Administered 2021-08-27 (×2): 10 meq via INTRAVENOUS
  Filled 2021-08-27 (×2): qty 100

## 2021-08-27 MED ORDER — CALCIUM GLUCONATE-NACL 2-0.675 GM/100ML-% IV SOLN
2.0000 g | Freq: Once | INTRAVENOUS | Status: DC
  Filled 2021-08-27: qty 100

## 2021-08-27 MED ORDER — LACTATED RINGERS IV SOLN: INTRAVENOUS | Status: DC

## 2021-08-27 MED ORDER — MAGNESIUM SULFATE 4 GM/100ML IV SOLN
4.0000 g | Freq: Once | INTRAVENOUS | Status: AC
  Administered 2021-08-27: 4 g via INTRAVENOUS
  Filled 2021-08-27: qty 100

## 2021-08-27 MED ORDER — BOOST / RESOURCE BREEZE PO LIQD CUSTOM
1.0000 | Freq: Three times a day (TID) | ORAL | Status: DC
  Administered 2021-08-27 – 2021-09-01 (×8): 1 via ORAL

## 2021-08-27 MED ORDER — FENTANYL CITRATE PF 50 MCG/ML IJ SOSY: 12.5000 ug | PREFILLED_SYRINGE | INTRAMUSCULAR | Status: DC | PRN

## 2021-08-27 MED ORDER — CALCIUM GLUCONATE-NACL 1-0.675 GM/50ML-% IV SOLN
1.0000 g | INTRAVENOUS | Status: AC
  Administered 2021-08-27 (×2): 1000 mg via INTRAVENOUS
  Filled 2021-08-27 (×2): qty 50

## 2021-08-27 NOTE — TOC Initial Note (Signed)
Transition of Care [facility]) - Initial/Assessment Note    Patient Details  Name: Courtney Dyer MRN: [id number] Date of Birth: [birth year]  Transition of Care [facility]) CM/SW Contact:    Salome Arnt, [place] Phone Number: 08/27/2021, 8:29 AM  Clinical Narrative: Pt admitted with intractable nausea and vomiting. TOC received consult for substance abuse counseling. Pt reports she lives with her aunt. She ambulates with a cane or a walker at baseline. Pt indicates she is looking for PCP and may call her aunt's PCP, but is agreeable to LCSW adding [facility] to AVS for pt to call at d/c as well if needed. Pt's aunt provides transportation to appointments. Discussed substance use with pt. She states she stopped drinking a week ago on her own. She was drinking 2 bottles of beer a day prior to this. Pt reports no resources are needed at this time. TOC will continue to follow.                   Expected Discharge Plan: Home/Self Care Barriers to Discharge: Continued Medical Work up   Patient Goals and CMS Choice Patient states their goals for this hospitalization and ongoing recovery are:: return home   Choice offered to / list presented to : Patient  Expected Discharge Plan and Services Expected Discharge Plan: Home/Self Care In-house Referral: Clinical Social Work                       DME Arranged: N/A                    Prior Living Arrangements/Services   Lives with:: Relatives Patient language and need for interpreter reviewed:: Yes Do you feel safe going back to the place where you live?: Yes          Current home services: DME (cane, walker) Criminal Activity/Legal Involvement Pertinent to Current Situation/Hospitalization: No - Comment as needed  Activities of Daily Living Home Assistive Devices/Equipment: Cane (specify quad or straight) ADL Screening (condition at time of admission) Patient's cognitive ability adequate to safely complete daily  activities?: Yes Is the patient deaf or have difficulty hearing?: No Does the patient have difficulty seeing, even when wearing glasses/contacts?: No Does the patient have difficulty concentrating, remembering, or making decisions?: No Patient able to express need for assistance with ADLs?: Yes Does the patient have difficulty dressing or bathing?: No Independently performs ADLs?: Yes (appropriate for developmental age) Does the patient have difficulty walking or climbing stairs?: No Weakness of Legs: Both Weakness of Arms/Hands: None  Permission Sought/Granted                  Emotional Assessment   Attitude/Demeanor/Rapport: Engaged Affect (typically observed): Accepting Orientation: : Oriented to Place, Oriented to Self, Oriented to  Time, Oriented to Situation Alcohol / Substance Use: Alcohol Use Psych Involvement: No (comment)  Admission diagnosis:  Hypokalemia [E87.6] Hypomagnesemia [E83.42] Low calcium levels [E83.51] Abnormal CT scan [R93.89] Intractable nausea and vomiting [R11.2] Chest pain [R07.9] Patient Active Problem List   Diagnosis Date Noted   Intractable nausea and vomiting 08/26/2021   Hypocalcemia 08/26/2021   Generalized weakness 08/26/2021   Alcohol abuse 08/26/2021   Acute urinary retention 08/26/2021   CKD (chronic kidney disease) stage 3, GFR 30-59 ml/min (HCC) 08/08/2021   Candidal intertrigo 08/06/2021   Adrenal mass ([place]) 08/06/2021   Intractable vomiting    AKI (acute kidney injury) ([place]) 04/23/2021   Hypokalemia 04/23/2021  Chronic anemia 04/23/2021   Hypomagnesemia 04/23/2021   High cholesterol    PCP:  Pcp, No Pharmacy:   [facility], [place] [address] Phone: [phone] Fax: [phone]  [place] [address], [place] - [place] [address] [place] [address] [place] [address] Phone: [phone] Fax: [phone]     Social Determinants of Health  (SDOH) Interventions    Readmission Risk Interventions Readmission Risk Prevention Plan 08/27/2021 04/27/2021  Medication Screening - Complete  Transportation Screening Complete Complete  Home Care Screening Complete -  Medication Review (RN CM) Complete -  Some recent data might be hidden

## 2021-08-27 NOTE — Progress Notes (Signed)
   08/27/21 2200  Vitals  Temp 98.8 F (37.1 C)  Temp Source Oral  BP 97/76  MAP (mmHg) 84  BP Method Automatic  Pulse Rate (!) 105  Pulse Rate Source Monitor  MEWS COLOR  MEWS Score Color Yellow  Oxygen Therapy  SpO2 100 %  MEWS Score  MEWS Temp 0  MEWS Systolic 1  MEWS Pulse 1  MEWS RR 0  MEWS LOC 0  MEWS Score 2

## 2021-08-27 NOTE — Progress Notes (Signed)
Initial Nutrition Assessment  DOCUMENTATION CODES:      INTERVENTION:  Boost Breeze po TID, each supplement provides 250 kcal and 9 grams of protein   ProSource Plus 30 ml TID (each 30 ml provides 100 kcal, 15 gr protein)   NUTRITION DIAGNOSIS:   Inadequate oral intake related to nausea, vomiting as evidenced by per patient/family report.   GOAL:  Patient will meet greater than or equal to 90% of their needs   MONITOR:  Diet advancement, Supplement acceptance, Weight trends, Labs  REASON FOR ASSESSMENT:   Consult Assessment of nutrition requirement/status  ASSESSMENT: RD working remotely.  Patient is a 55 yo female presents with intractable N/V. History includes ETOH abuse, DM2 and Gout.   GI following- pt NPO after MN possible EGD and or esophageal dilation tomorrow  Patient is tolerating clears per discussion with nursing. Unable to reach pt by telephone. Will add ONS to support intake of energy and protein.   Weight encounters reviewed. Highly variable 77 kg-108.9 kg. Usual weight unknown.   Medications reviewed and include: Tums, Folic acid, Insulin, Mag-ox, MVI and Thiamine.  IVF-LR @ 75 ml/hr.   Labs: Hgb 6.7 (L). Receiving 2 Units PRBC. Calcium and potassium being repleted. BMP Latest Ref Rng & Units 08/27/2021 08/26/2021 08/09/2021  Glucose 70 - 99 mg/dL 107(H) 206(H) 127(H)  BUN 6 - 20 mg/dL 20 21(H) 34(H)  Creatinine 0.44 - 1.00 mg/dL 1.92(H) 2.11(H) 2.31(H)  Sodium 135 - 145 mmol/L 133(L) 133(L) 137  Potassium 3.5 - 5.1 mmol/L 2.4(LL) 2.4(LL) 3.7  Chloride 98 - 111 mmol/L 95(L) 90(L) 101  CO2 22 - 32 mmol/L '27 28 28  '$ Calcium 8.9 - 10.3 mg/dL 5.3(LL) 5.2(LL) 8.0(L)      NUTRITION - FOCUSED PHYSICAL EXAM: Unable to complete Nutrition-Focused physical exam at this time. RD working remotely.    Diet Order:   Diet Order             Diet NPO time specified  Diet effective midnight           Diet clear liquid Room service appropriate? Yes; Fluid  consistency: Thin  Diet effective now                   EDUCATION NEEDS:  Not appropriate for education at this time  Skin:  Skin Assessment: Reviewed RN Assessment  Last BM:  unknown  Height:   Ht Readings from Last 1 Encounters:  08/26/21 '5\' 4"'$  (1.626 m)    Weight:   Wt Readings from Last 1 Encounters:  08/26/21 108.9 kg    Ideal Body Weight:   55 kg  BMI:  Body mass index is 41.2 kg/m.  Estimated Nutritional Needs:   Kcal:  1800-2000  Protein:  99-110 gr  Fluid:  1.8-2.0 liters daily   Colman Cater MS,RD,CSG,LDN Contact: Shea Evans

## 2021-08-27 NOTE — Consult Note (Signed)
Referring Provider: Murlean Iba, MD Primary Care Physician:  Pcp, No Primary Gastroenterologist:  previously unassigned, Dr. Abbey Chatters  Reason for Consultation:  intractable N/V  HPI: Courtney Dyer is a 55 y.o. female with past medical history significant for type 2 diabetes mellitus, gout, hyperlipidemia, obesity who presented to the emergency department via EMS for nausea and generalized weakness.  Patient is poor historian. She has history of significant alcohol abuse.  Reports that she was drinking at least 80 ounces of beer daily.  States after she was in the hospital last month she decided she needed to quit.  States her stomach hurts when she tries to drink. Patient reports several days of not feeling well.  Multiple episodes of vomiting and diarrhea.  Denies hematemesis, melena, rectal bleeding.  States she does not vomit daily but frequently.  Unable to tolerate oral intake.  She did complain of some epigastric tenderness for the past several days.  She has had heartburn.  Complains of pill dysphagia.  States that she quit drinking a couple of weeks ago.  States she also quit smoking.  Denies tremors, seizures, hallucinations.  Patient denies marijuana use.  Patient was hospitalized a month with nausea/vomiting thought to be due to viral gastroenteritis and UTI.  GI was not consulted.  In the ED: Systolic blood pressures running from 95 to the 110s.  Pulse between 100-110.  Glucose 210, sodium 133, potassium 2.4, BUN 21, creatinine 2.11, calcium 5.2, albumin 2.1, total bilirubin 0.8, alkaline phosphatase 74, AST 18, ALT 6, white blood cell count 15.4, hemoglobin 8.5 (hemoglobin 8.5 last month), hematocrit 25.6, MCV 79.8, platelets 147,000, magnesium 0.7, troponin I 14--> 18--> 18.  A1c 6.3.  Today her potassium was 2.4, sodium 133, creatinine 1.92, BUN 20, calcium 5.3, albumin 1.8, magnesium 1.2, hemoglobin 6.7, white blood cell count 11,700.  Chest x-ray with low lung volumes  without acute or active cardiopulmonary disease.  CT abdomen pelvis without contrast showing multiple small gallstones but no acute cholecystitis.  Minimal sigmoid diverticulosis.  No bowel obstruction or ileus.  Dysplastic right kidney unchanged.  No urinary tract calculi or obstructive uropathy.   Prior to Admission medications   Medication Sig Start Date End Date Taking? Authorizing Provider  cefdinir (OMNICEF) 300 MG capsule Take 1 capsule (300 mg total) by mouth daily. 08/10/21  Yes Tat, Shanon Brow, MD  magnesium oxide (MAG-OX) 400 (240 Mg) MG tablet Take 1 tablet (400 mg total) by mouth daily. 08/09/21  Yes Tat, Shanon Brow, MD  folic acid (FOLVITE) 1 MG tablet Take 1 tablet (1 mg total) by mouth daily. 08/10/21   Orson Eva, MD    Current Facility-Administered Medications  Medication Dose Route Frequency Provider Last Rate Last Admin   0.9 %  sodium chloride infusion (Manually program via Guardrails IV Fluids)   Intravenous Once Wynetta Emery, Clanford L, MD       acetaminophen (TYLENOL) tablet 650 mg  650 mg Oral Q6H PRN Zierle-Ghosh, Asia B, DO       Or   acetaminophen (TYLENOL) suppository 650 mg  650 mg Rectal Q6H PRN Zierle-Ghosh, Asia B, DO       calcium carbonate (TUMS - dosed in mg elemental calcium) chewable tablet 200 mg of elemental calcium  1 tablet Oral TID WC Johnson, Clanford L, MD       calcium gluconate 2 g/ 100 mL sodium chloride IVPB  2 g Intravenous Once Johnson, Clanford L, MD       fentaNYL (SUBLIMAZE) injection 12.5-25 mcg  12.5-25 mcg Intravenous Q2H PRN Wynetta Emery, Clanford L, MD       folic acid (FOLVITE) tablet 1 mg  1 mg Oral Daily Zierle-Ghosh, Asia B, DO   1 mg at 08/27/21 0803   heparin injection 5,000 Units  5,000 Units Subcutaneous Q8H Zierle-Ghosh, Asia B, DO   5,000 Units at 08/27/21 0523   insulin aspart (novoLOG) injection 0-15 Units  0-15 Units Subcutaneous TID WC Zierle-Ghosh, Asia B, DO       insulin aspart (novoLOG) injection 0-5 Units  0-5 Units Subcutaneous QHS  Zierle-Ghosh, Asia B, DO       lactated ringers infusion   Intravenous Continuous Zierle-Ghosh, Asia B, DO 75 mL/hr at 08/27/21 0523 New Bag at 08/27/21 0523   LORazepam (ATIVAN) tablet 1-4 mg  1-4 mg Oral Q1H PRN Zierle-Ghosh, Asia B, DO       Or   LORazepam (ATIVAN) injection 1-4 mg  1-4 mg Intravenous Q1H PRN Zierle-Ghosh, Asia B, DO       LORazepam (ATIVAN) tablet 0-4 mg  0-4 mg Oral Q6H Zierle-Ghosh, Asia B, DO       Followed by   Derrill Memo ON 08/29/2021] LORazepam (ATIVAN) tablet 0-4 mg  0-4 mg Oral Q12H Zierle-Ghosh, Asia B, DO       magnesium oxide (MAG-OX) tablet 400 mg  400 mg Oral Daily Zierle-Ghosh, Asia B, DO   400 mg at 08/27/21 [id number]   magnesium sulfate IVPB 4 g 100 mL  4 g Intravenous Once Wynetta Emery, Clanford L, MD 50 mL/hr at 08/27/21 0802 4 g at 08/27/21 0802   multivitamin with minerals tablet 1 tablet  1 tablet Oral Daily Zierle-Ghosh, Asia B, DO   1 tablet at 08/27/21 0803   nystatin (MYCOSTATIN/NYSTOP) topical powder   Topical TID Zierle-Ghosh, Asia B, DO   Given at 08/26/21 2316   ondansetron (ZOFRAN) tablet 4 mg  4 mg Oral Q6H PRN Zierle-Ghosh, Asia B, DO       Or   ondansetron (ZOFRAN) injection 4 mg  4 mg Intravenous Q6H PRN Zierle-Ghosh, Asia B, DO       potassium chloride 10 mEq in 100 mL IVPB  10 mEq Intravenous Q1 Hr x 4 Johnson, Clanford L, MD       thiamine tablet 100 mg  100 mg Oral Daily Zierle-Ghosh, Asia B, DO   100 mg at 08/27/21 0803   Or   thiamine (B-1) injection 100 mg  100 mg Intravenous Daily Zierle-Ghosh, Asia B, DO        Allergies as of 08/26/2021   (No Known Allergies)    Past Medical History:  Diagnosis Date   Diabetes mellitus    Gout    High cholesterol     Past Surgical History:  Procedure Laterality Date   BREAST SURGERY     NEPHRECTOMY      Family History  Problem Relation Age of Onset   Hypertension Maternal Aunt     Social History   Socioeconomic History   Marital status: Widowed    Spouse name: Not on file   Number of  children: Not on file   Years of education: Not on file   Highest education level: Not on file  Occupational History   Not on file  Tobacco Use   Smoking status: Every Day    Packs/day: 0.50    Types: Cigarettes   Smokeless tobacco: Never  Substance and Sexual Activity   Alcohol use: Yes    Comment: daily   Drug  use: No   Sexual activity: Yes    Birth control/protection: None  Other Topics Concern   Not on file  Social History Narrative   Not on file   Social Determinants of Health   Financial Resource Strain: Not on file  Food Insecurity: Not on file  Transportation Needs: Not on file  Physical Activity: Not on file  Stress: Not on file  Social Connections: Not on file  Intimate Partner Violence: Not on file     ROS:  General: Negative for  weight loss, fever, chills, fatigue. See hpi. Eyes: Negative for vision changes.  ENT: Negative for hoarseness,  nasal congestion. See hpi. CV: Negative for chest pain, angina, palpitations, dyspnea on exertion, peripheral edema.  Respiratory: Negative for dyspnea at rest, dyspnea on exertion, cough, sputum, wheezing.  GI: See history of present illness. GU:  Negative for dysuria, hematuria, urinary incontinence, urinary frequency, nocturnal urination.  MS: Negative for joint pain, low back pain.  Derm: Negative for rash or itching.  Neuro: Negative for weakness, abnormal sensation, seizure, frequent headaches, memory loss, confusion.  Psych: Negative for anxiety, depression, suicidal ideation, hallucinations.  Endo: Negative for unusual weight change.  Heme: Negative for bruising or bleeding. Allergy: Negative for rash or hives.       Physical Examination: Vital signs in last 24 hours: Temp:  [97.6 F (36.4 C)-98.6 F (37 C)] 98.6 F (37 C) (10/13 0801) Pulse Rate:  [93-110] 98 (10/13 0801) Resp:  [16-22] 18 (10/13 0801) BP: (95-121)/(52-85) 95/60 (10/13 0801) SpO2:  [99 %-100 %] 100 % (10/13 0801) Weight:  [108.9 kg]  108.9 kg (10/12 1343)    General: Well-nourished, well-developed in no acute distress.  Head: Normocephalic, atraumatic.   Eyes: Conjunctiva pink, no icterus. Mouth: Oropharyngeal mucosa moist and pink , no lesions erythema or exudate. Neck: Supple without thyromegaly, masses, or lymphadenopathy.  Lungs: Clear to auscultation bilaterally.  Heart: Regular rate and rhythm, no murmurs rubs or gallops.  Abdomen: Bowel sounds are normal, nontender, nondistended, no hepatosplenomegaly or masses, no abdominal bruits or    hernia , no rebound or guarding.   Rectal: not performed Extremities: No lower extremity edema, clubbing, deformity.  Neuro: Alert and oriented x 4 , grossly normal neurologically.  Skin: Warm and dry, no rash or jaundice.   Psych: Alert and cooperative, normal mood and affect.        Intake/Output from previous day: 10/12 0701 - 10/13 0700 In: 1512.8 [IV Piggyback:1512.8] Out: -  Intake/Output this shift: No intake/output data recorded.  Lab Results: CBC Recent Labs    08/26/21 1503 08/27/21 0610  WBC 15.4* 11.7*  HGB 8.5* 6.7*  HCT 25.6* 21.1*  MCV 79.8* 81.2  PLT 147* 121*   BMET Recent Labs    08/26/21 1503 08/27/21 0610  NA 133* 133*  K 2.4* 2.4*  CL 90* 95*  CO2 28 27  GLUCOSE 206* 107*  BUN 21* 20  CREATININE 2.11* 1.92*  CALCIUM 5.2* 5.3*   LFT Recent Labs    08/26/21 1503 08/27/21 0610  BILITOT 0.8 0.9  ALKPHOS 74 61  AST 18 12*  ALT 6 7  PROT 6.4* 5.6*  ALBUMIN 2.1* 1.8*    Lipase Recent Labs    08/26/21 1748  LIPASE 20    PT/INR No results for input(s): LABPROT, INR in the last 72 hours.    Imaging Studies: CT ABDOMEN PELVIS WO CONTRAST  Result Date: 08/26/2021 CLINICAL DATA:  Nausea, weakness, abdominal pain EXAM: CT ABDOMEN  AND PELVIS WITHOUT CONTRAST TECHNIQUE: Multidetector CT imaging of the abdomen and pelvis was performed following the standard protocol without IV contrast. COMPARISON:  08/06/2021 FINDINGS:  Lower chest: No acute pleural or parenchymal lung disease. Hepatobiliary: Multiple small gallstones are identified, with no evidence of acute cholecystitis. Unenhanced imaging of the liver is unremarkable. Pancreas: Unremarkable. No pancreatic ductal dilatation or surrounding inflammatory changes. Spleen: Normal in size without focal abnormality. Adrenals/Urinary Tract: Stable dysplastic right kidney. Scattered areas of cortical scarring are again seen within the left kidney, unchanged. No left-sided calculi or obstructive uropathy. Stable low-attenuation left adrenal nodule measuring 1.7 cm consistent with adenoma. The right adrenals unremarkable. Stomach/Bowel: No bowel obstruction or ileus. Normal appendix right lower quadrant. Scattered sigmoid diverticulosis without diverticulitis. No bowel wall thickening or inflammatory change. Vascular/Lymphatic: Aortic atherosclerosis. No enlarged abdominal or pelvic lymph nodes. Reproductive: Uterus and bilateral adnexa are unremarkable. Other: No free fluid or free gas. Stable small fat containing umbilical hernia. Musculoskeletal: No acute or destructive bony lesions. Reconstructed images demonstrate no additional findings. IMPRESSION: 1. Cholelithiasis without cholecystitis. 2. Minimal sigmoid diverticulosis without diverticulitis. 3. No bowel obstruction or ileus. 4. No urinary tract calculi or obstructive uropathy. Dysplastic right kidney unchanged. Electronically Signed   By: Randa Ngo M.D.   On: 08/26/2021 18:35   CT ABDOMEN PELVIS WO CONTRAST  Result Date: 08/06/2021 CLINICAL DATA:  Nausea and vomiting. Weakness. Poor food intake. Stage Ali elevated creatinine on today's labs. EXAM: CT ABDOMEN AND PELVIS WITHOUT CONTRAST TECHNIQUE: Multidetector CT imaging of the abdomen and pelvis was performed following the standard protocol without IV contrast. COMPARISON:  04/23/2021 FINDINGS: Lower chest: Linear subsegmental atelectasis or scarring in the right lower  lobe. Hepatobiliary: Faint dependent heterogeneity in the gallbladder, potentially from sludge or gallstones. No gallbladder wall thickening or pericholecystic fluid. The liver appears otherwise unremarkable. Pancreas: Unremarkable Spleen: Unremarkable Adrenals/Urinary Tract: 1.9 by 2.5 cm left adrenal mass with internal density 16 Hounsfield units, indeterminate. The right adrenal gland appears normal. There is only a small nodule of tissue at the expected location of the right kidney, compatible with dysplastic kidney. Urinary bladder unremarkable. Retained fetal lobulation of the left kidney. No hydronephrosis or hydroureter. Stomach/Bowel: Fatty deposition in the wall of the cecum which is typically an incidental normal variant although has a weak association with inflammatory bowel disease. Currently the terminal ileum appears grossly unremarkable. Normal appendix. The colon is relatively empty which correlates with the patient's history of reduced oral intake. There some scattered diverticula of the descending and proximal sigmoid colon without findings of active diverticulitis. No dilated bowel. Vascular/Lymphatic: Unremarkable Reproductive: Unremarkable Other: No supplemental non-categorized findings. Musculoskeletal: Stable mild SI joint arthropathy with suspected erosions along the SI joints. Mild to moderate lumbar spondylosis and degenerative disc disease. Small umbilical hernia contains adipose tissue. Bridging spurring in the lower thoracic spine. IMPRESSION: 1. A specific cause for the patient's symptoms is not identified. There is currently no left hydronephrosis or hydroureter to correlate with the patient's recent elevated creatinine. 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 2. Stable left adrenal mass, with nonspecific internal density. 3. Sludge versus gallstones in the gallbladder. No pericholecystic fluid or gallbladder wall thickening. 4. Suspected chronic erosions along  the SI joints compatible with bilateral sacroiliitis. Electronically Signed   By: Van Clines M.D.   On: 08/06/2021 14:58   DG Chest Port 1 View  Result Date: 08/26/2021 CLINICAL DATA:  Nausea and generalized weakness. EXAM: PORTABLE CHEST 1 VIEW COMPARISON:  August 06, 2021 FINDINGS: Low lung volumes are seen. There is no evidence of acute infiltrate, pleural effusion or pneumothorax. The heart size and mediastinal contours are within normal limits. Stable chronic and degenerative changes seen throughout the thoracic spine and bilateral shoulders. IMPRESSION: Low lung volumes without acute or active cardiopulmonary disease. Electronically Signed   By: Virgina Norfolk M.D.   On: 08/26/2021 15:25   DG Chest Port 1 View  Result Date: 08/06/2021 CLINICAL DATA:  Concern for sepsis EXAM: PORTABLE CHEST 1 VIEW COMPARISON:  Chest x-ray dated November 26, 2015 FINDINGS: Cardiac and mediastinal contours are within normal limits. Bibasilar atelectasis. Lungs otherwise clear. No evidence of pleural effusion or pneumothorax. IMPRESSION: No active disease. Electronically Signed   By: Yetta Glassman M.D.   On: 08/06/2021 13:45  [4 week]   Impression:  55 year old female with diabetes mellitus, gout, hyperlipidemia, obesity presenting to the ED with generalized weakness.  Patient is a poor historian.  She complains of chronic intermittent vomiting, diminished oral intake, some diarrhea.  Nausea/vomiting: Recent admission with the same.  Patient reports she does not vomit on a daily basis but symptoms are frequent.  Denies hematemesis or coffee-ground emesis.  Recently stopped drinking alcohol because of stomach issues.  She also reports heartburn, pill dysphagia, epigastric pain. Has been consuming TWO 40 ounce beers daily, chronically.  CT with no acute findings. She presents with profound hypokalemia, hypomagnesemia, hyponatremia, hypocalcemia.  Albumin of 1.8 today.  Differential diagnosis includes  gastritis, ulcers, esophagitis. Less likely biliary.   Anemia: Chronic anemia with baseline hemoglobin in the 8-9 range.  Hemoglobin was 8.5 on September 24.  Hemoglobin 8.5 on presentation yesterday, down to 6.7 today.  Stool Hemoccult pending.  No overt GI bleeding.  Last month anemia panel consistent with mild folate deficiency  Plan: Transfuse as needed.  Currently has 2 units of packed red blood cells plan. IV pantoprazole 40 mg twice daily. Continue clear liquid diet today. Will make n.p.o. after midnight, consider EGD plus or minus esophageal dilation tomorrow if electrolytes have been replenished. Consider holding heparin, given profound anemia, possible occult bleeding.  At minimum will need to hold after midnight.   We would like to thank you for the opportunity to participate in the care of Courtney Dyer.  Laureen Ochs. Bernarda [facility] [phone] 10/13/202212:17 PM   LOS: 1 day

## 2021-08-27 NOTE — Progress Notes (Signed)
Date and time results received: 08/27/21 0715  Test: Hgb Critical Value: 6.7  Name of Provider Notified: Murvin Natal, MD  No New Orders Yet.

## 2021-08-27 NOTE — Progress Notes (Addendum)
PROGRESS NOTE   Courtney Dyer  [id number] DOB: [birth year] DOA: 08/26/2021 PCP: Pcp, No   Chief Complaint  Patient presents with   Hyperglycemia   Level of care: Telemetry  Brief Admission History:  55 y.o. female, with history of DMII, HLD, gout, and more presents to the ED with a chief complaint of generalized weakness.  Patient reports that this has been going on for about a week.  Today she was so weak she could not walk.  Patient normally ambulates with a cane.  She reports that sitting in her chair she felt woozy.  She admits to chronic nausea and vomiting, the last episode of emesis being yesterday.  Emesis is nonbloody.  Patient reports normal appetite.  Patient denies any chest pain, shortness of breath, cough.  She admits to epigastric abdominal pain that feels sharp.  Nothing makes it worse.  It was improved but not relieved with Pepto-Bismol.  Patient has no dysuria, and has noticed no change in urine output.  Patient does report chronic loose stools as well.  Her last bowel movement was day before yesterday, liquid, brown.  Having a bowel movement does make her abdominal pain better.  The nausea, and loose stools have been present on and off for 2 weeks.  The belly pain has been present for 1 week.  The weakness has been progressively worse for an unknown amount of time, but acutely worse today.  The only other complaint patient has is that her glucose was high in the 300s today.  This was postprandial.  Last hemoglobin A1c was 5.9, so 300 would be quite high for her.   Patient quit smoking and quit drinking 1 week ago.  She reports that up until then she was drinking 2 bottles of beer per day.  She has not had any tremors, hallucinations, seizures.  She reports that she does not use illicit drugs.  She is vaccinated for COVID.  Patient is full code.  Assessment & Plan:   Active Problems:   Hypokalemia   Hypomagnesemia   Intractable nausea and vomiting   Hypocalcemia    Generalized weakness   Alcohol abuse   Acute urinary retention  Intractable nausea and vomiting -As evidenced by severe electrolyte abnormalities seen on BMP -As evidenced by severe clinical dehydration -Appreciate GI consultation and recommendations for upper endoscopy -Replete electrolytes aggressively and provide IV fluid hydration  Hypokalemia/hypocalcemia/hyponatremia/hypomagnesemia -Aggressive replacement of electrolytes ordered -Continue IV fluid hydration -Monitor daily electrolytes studies -Continue telemetry monitoring  Generalized weakness -Likely multifactorial given chronic alcoholism, malnutrition and severe electrolyte abnormalities -Replete IV fluids and electrolytes -PT evaluation requested -Consult dietitian -Vitamin supplements as ordered  Chronic alcohol abuse -Patient reports last alcohol consumption was 1 week ago -Continue CIWA protocol -Continue vitamin supplementation as ordered. -Consulted TOC regarding alcohol treatment options  Leukocytosis -Likely reactive, repeat CBC with differential in a.m.  Type 2 diabetes mellitus, diet controlled -Follow-up hemoglobin A1c -Monitor CBG  Normocytic anemia -GI consulted for upper endoscopy -Protonix ordered per GI recommendations -Repeat CBC in a.m. -Suspect nutritional component  Acute urinary retention  - I/O cath PRN if unable to void - try to avoid foley placement    DVT prophylaxis: SCDs Code Status: Full Family Communication: None present Disposition: Anticipate home Status is: Inpatient  Remains inpatient appropriate because: IV fluid, IV electrolyte replacement and inpatient procedure required   Consultants:  Inpatient GI service   Procedures:  Tentative EGD 10/14 per GI   Antimicrobials:  N/a   Subjective:  Patient reports that she feels tired and weak and reports that she feels nauseated but has not recently vomited  Objective: Vitals:   08/27/21 0933 08/27/21 1040 08/27/21  1058 08/27/21 1330  BP: 101/72 120/73 103/70 101/67  Pulse: (!) 104 (!) 102 (!) 106 (!) 103  Resp: '18 18 18 17  '$ Temp: 97.6 F (36.4 C) 98.6 F (37 C) 97.9 F (36.6 C) 99.1 F (37.3 C)  TempSrc: Oral Oral Oral Oral  SpO2: 99% 100% 99% 100%  Weight:      Height:        Intake/Output Summary (Last 24 hours) at 08/27/2021 1413 Last data filed at 08/27/2021 1330 Gross per 24 hour  Intake 1962.76 ml  Output --  Net 1962.76 ml   Filed Weights   08/26/21 1343  Weight: 108.9 kg    Examination:  General exam: chronically ill, disheveled appearing female, Appears calm and comfortable  Respiratory system: Clear to auscultation. Respiratory effort normal. Cardiovascular system: normal S1 & S2 heard. No JVD, murmurs, rubs, gallops or clicks. No pedal edema. Gastrointestinal system: Abdomen is nondistended, soft and nontender. No organomegaly or masses felt. Normal bowel sounds heard. Central nervous system: Alert and oriented. No focal neurological deficits. Extremities: Symmetric 5 x 5 power. Skin: No rashes, lesions or ulcers Psychiatry: Judgement and insight appear poor. Mood & affect flat.   Data Reviewed: I have personally reviewed following labs and imaging studies  CBC: Recent Labs  Lab 08/26/21 1503 08/27/21 0610  WBC 15.4* 11.7*  NEUTROABS 13.5* 9.2*  HGB 8.5* 6.7*  HCT 25.6* 21.1*  MCV 79.8* 81.2  PLT 147* 121*    Basic Metabolic Panel: Recent Labs  Lab 08/26/21 1503 08/27/21 0610  NA 133* 133*  K 2.4* 2.4*  CL 90* 95*  CO2 28 27  GLUCOSE 206* 107*  BUN 21* 20  CREATININE 2.11* 1.92*  CALCIUM 5.2* 5.3*  MG 0.7* 1.2*    GFR: Estimated Creatinine Clearance: 40.9 mL/min (A) (by C-G formula based on SCr of 1.92 mg/dL (H)).  Liver Function Tests: Recent Labs  Lab 08/26/21 1503 08/27/21 0610  AST 18 12*  ALT 6 7  ALKPHOS 74 61  BILITOT 0.8 0.9  PROT 6.4* 5.6*  ALBUMIN 2.1* 1.8*    CBG: Recent Labs  Lab 08/26/21 1502 08/26/21 1514  08/26/21 2207 08/27/21 0753 08/27/21 1109  GLUCAP 210* 194* 140* 100* 195*    Recent Results (from the past 240 hour(s))  Resp Panel by RT-PCR (Flu A&B, Covid) Nasopharyngeal Swab     Status: None   Collection Time: 08/26/21  4:39 PM   Specimen: Nasopharyngeal Swab; Nasopharyngeal(NP) swabs in vial transport medium  Result Value Ref Range Status   SARS Coronavirus 2 by RT PCR NEGATIVE NEGATIVE Final    Comment: (NOTE) SARS-CoV-2 target nucleic acids are NOT DETECTED.  The SARS-CoV-2 RNA is generally detectable in upper respiratory specimens during the acute phase of infection. The lowest concentration of SARS-CoV-2 viral copies this assay can detect is 138 copies/mL. A negative result does not preclude SARS-Cov-2 infection and should not be used as the sole basis for treatment or other patient management decisions. A negative result may occur with  improper specimen collection/handling, submission of specimen other than nasopharyngeal swab, presence of viral mutation(s) within the areas targeted by this assay, and inadequate number of viral copies(<138 copies/mL). A negative result must be combined with clinical observations, patient history, and epidemiological information. The expected result is Negative.  Fact Sheet for Patients:  EntrepreneurPulse.com.au  Fact Sheet for Healthcare Providers:  IncredibleEmployment.be  This test is no t yet approved or cleared by the [place] FDA and  has been authorized for detection and/or diagnosis of SARS-CoV-2 by FDA under an Emergency Use Authorization (EUA). This EUA will remain  in effect (meaning this test can be used) for the duration of the COVID-19 declaration under Section 564(b)(1) of the Act, 21 U.S.C.section 360bbb-3(b)(1), unless the authorization is terminated  or revoked sooner.       Influenza A by PCR NEGATIVE NEGATIVE Final   Influenza B by PCR NEGATIVE NEGATIVE Final     Comment: (NOTE) The Xpert Xpress SARS-CoV-2/FLU/RSV plus assay is intended as an aid in the diagnosis of influenza from Nasopharyngeal swab specimens and should not be used as a sole basis for treatment. Nasal washings and aspirates are unacceptable for Xpert Xpress SARS-CoV-2/FLU/RSV testing.  Fact Sheet for Patients: EntrepreneurPulse.com.au  Fact Sheet for Healthcare Providers: IncredibleEmployment.be  This test is not yet approved or cleared by the [place] FDA and has been authorized for detection and/or diagnosis of SARS-CoV-2 by FDA under an Emergency Use Authorization (EUA). This EUA will remain in effect (meaning this test can be used) for the duration of the COVID-19 declaration under Section 564(b)(1) of the Act, 21 U.S.C. section 360bbb-3(b)(1), unless the authorization is terminated or revoked.  Performed at [facility], [address], [place], [place] [address]      Radiology Studies: CT ABDOMEN PELVIS WO CONTRAST  Result Date: 08/26/2021 CLINICAL DATA:  Nausea, weakness, abdominal pain EXAM: CT ABDOMEN AND PELVIS WITHOUT CONTRAST TECHNIQUE: Multidetector CT imaging of the abdomen and pelvis was performed following the standard protocol without IV contrast. COMPARISON:  08/06/2021 FINDINGS: Lower chest: No acute pleural or parenchymal lung disease. Hepatobiliary: Multiple small gallstones are identified, with no evidence of acute cholecystitis. Unenhanced imaging of the liver is unremarkable. Pancreas: Unremarkable. No pancreatic ductal dilatation or surrounding inflammatory changes. Spleen: Normal in size without focal abnormality. Adrenals/Urinary Tract: Stable dysplastic right kidney. Scattered areas of cortical scarring are again seen within the left kidney, unchanged. No left-sided calculi or obstructive uropathy. Stable low-attenuation left adrenal nodule measuring 1.7 cm consistent with adenoma. The right adrenals  unremarkable. Stomach/Bowel: No bowel obstruction or ileus. Normal appendix right lower quadrant. Scattered sigmoid diverticulosis without diverticulitis. No bowel wall thickening or inflammatory change. Vascular/Lymphatic: Aortic atherosclerosis. No enlarged abdominal or pelvic lymph nodes. Reproductive: Uterus and bilateral adnexa are unremarkable. Other: No free fluid or free gas. Stable small fat containing umbilical hernia. Musculoskeletal: No acute or destructive bony lesions. Reconstructed images demonstrate no additional findings. IMPRESSION: 1. Cholelithiasis without cholecystitis. 2. Minimal sigmoid diverticulosis without diverticulitis. 3. No bowel obstruction or ileus. 4. No urinary tract calculi or obstructive uropathy. Dysplastic right kidney unchanged. Electronically Signed   By: Randa Ngo M.D.   On: 08/26/2021 18:35   US RENAL  Result Date: 08/27/2021 CLINICAL DATA:  History of right nephrectomy, abnormal CT scan yesterday EXAM: RENAL / URINARY TRACT ULTRASOUND COMPLETE COMPARISON:  CT abdomen/pelvis 1 day prior FINDINGS: Right Kidney: Surgically absent Left Kidney: Renal measurements: 9.9 cm x 5.8 cm x 5.8 cm = volume: 175 mL. Two anecoic lesions are seen measuring up to 1.4 cm consistent with cysts. There is an echogenic focus in the left kidney lower pole measuring 5 mm. Note that no stone was seen on the CT abdomen/pelvis from 1 day prior. There is no hydronephrosis. Bladder: Appears normal for degree of bladder distention. Other: Shadowing  gallstones are incidentally noted in the gallbladder lumen. IMPRESSION: 1. 5 mm echogenic focus in the left kidney is favored to be artifactual, as no stone was seen on the CT abdomen/pelvis from 1 day prior. No left hydronephrosis. 2. Two left renal cysts. 3. Status post right nephrectomy. 4. Cholelithiasis. Electronically Signed   By: Valetta Mole M.D.   On: 08/27/2021 09:07   DG Chest Port 1 View  Result Date: 08/26/2021 CLINICAL DATA:   Nausea and generalized weakness. EXAM: PORTABLE CHEST 1 VIEW COMPARISON:  August 06, 2021 FINDINGS: Low lung volumes are seen. There is no evidence of acute infiltrate, pleural effusion or pneumothorax. The heart size and mediastinal contours are within normal limits. Stable chronic and degenerative changes seen throughout the thoracic spine and bilateral shoulders. IMPRESSION: Low lung volumes without acute or active cardiopulmonary disease. Electronically Signed   By: Virgina Norfolk M.D.   On: 08/26/2021 15:25    Scheduled Meds:  (feeding supplement) PROSource Plus  30 mL Oral TID BM   calcium carbonate  1 tablet Oral TID WC   feeding supplement  1 Container Oral TID BM   folic acid  1 mg Oral Daily   insulin aspart  0-15 Units Subcutaneous TID WC   insulin aspart  0-5 Units Subcutaneous QHS   LORazepam  0-4 mg Oral Q6H   Followed by   Derrill Memo ON 08/29/2021] LORazepam  0-4 mg Oral Q12H   magnesium oxide  400 mg Oral Daily   multivitamin with minerals  1 tablet Oral Daily   nystatin   Topical TID   pantoprazole (PROTONIX) IV  40 mg Intravenous Q12H   thiamine  100 mg Oral Daily   Or   thiamine  100 mg Intravenous Daily   Continuous Infusions:  lactated ringers 75 mL/hr at 08/27/21 0523    LOS: 1 day   Time spent: 82 mins     Willamae Demby Wynetta Emery, MD How to contact the [facility] Attending or Consulting provider [place] or covering provider during after hours [place], for this patient?  Check the care team in [facility] and look for a) attending/consulting [facility] provider listed and b) the [facility] team listed Log into [email or web address] and use 's universal password to access. If you do not have the password, please contact the hospital operator. Locate the [facility] provider you are looking for under [facility] and page to a number that you can be directly reached. If you still have difficulty reaching the provider, please page the [facility] (Director on Call) for the Hospitalists listed on amion for  assistance.  08/27/2021, 2:13 PM

## 2021-08-28 ENCOUNTER — Inpatient Hospital Stay (HOSPITAL_COMMUNITY): Payer: Medicare Other | Admitting: Certified Registered Nurse Anesthetist

## 2021-08-28 ENCOUNTER — Encounter (HOSPITAL_COMMUNITY): Payer: Self-pay | Admitting: Family Medicine

## 2021-08-28 ENCOUNTER — Encounter (HOSPITAL_COMMUNITY)
Admission: EM | Disposition: A | Discharge status: Skilled Nursing Facility | Payer: Self-pay | Source: Home / Self Care | Attending: Family Medicine

## 2021-08-28 DIAGNOSIS — K2091 Esophagitis, unspecified with bleeding: Secondary | ICD-10-CM | POA: Diagnosis not present

## 2021-08-28 DIAGNOSIS — D62 Acute posthemorrhagic anemia: Secondary | ICD-10-CM

## 2021-08-28 DIAGNOSIS — R109 Unspecified abdominal pain: Secondary | ICD-10-CM

## 2021-08-28 DIAGNOSIS — E876 Hypokalemia: Secondary | ICD-10-CM | POA: Diagnosis not present

## 2021-08-28 DIAGNOSIS — R1310 Dysphagia, unspecified: Secondary | ICD-10-CM | POA: Diagnosis not present

## 2021-08-28 DIAGNOSIS — F1010 Alcohol abuse, uncomplicated: Secondary | ICD-10-CM | POA: Diagnosis not present

## 2021-08-28 DIAGNOSIS — R338 Other retention of urine: Secondary | ICD-10-CM | POA: Diagnosis not present

## 2021-08-28 HISTORY — PX: BIOPSY: SHX5522

## 2021-08-28 HISTORY — PX: ESOPHAGOGASTRODUODENOSCOPY (EGD) WITH PROPOFOL: SHX5813

## 2021-08-28 LAB — CBC WITH DIFFERENTIAL/PLATELET
Abs Immature Granulocytes: 0.11 10*3/uL — ABNORMAL HIGH (ref 0.00–0.07)
Basophils Absolute: 0 10*3/uL (ref 0.0–0.1)
Basophils Relative: 0 %
Eosinophils Absolute: 0.1 10*3/uL (ref 0.0–0.5)
Eosinophils Relative: 1 %
HCT: 31.2 % — ABNORMAL LOW (ref 36.0–46.0)
Hemoglobin: 10.2 g/dL — ABNORMAL LOW (ref 12.0–15.0)
Immature Granulocytes: 1 %
Lymphocytes Relative: 19 %
Lymphs Abs: 2.2 10*3/uL (ref 0.7–4.0)
MCH: 27.3 pg (ref 26.0–34.0)
MCHC: 32.7 g/dL (ref 30.0–36.0)
MCV: 83.4 fL (ref 80.0–100.0)
Monocytes Absolute: 0.5 10*3/uL (ref 0.1–1.0)
Monocytes Relative: 4 %
Neutro Abs: 8.9 10*3/uL — ABNORMAL HIGH (ref 1.7–7.7)
Neutrophils Relative %: 75 %
Platelets: 154 10*3/uL (ref 150–400)
RBC: 3.74 MIL/uL — ABNORMAL LOW (ref 3.87–5.11)
RDW: 14.7 % (ref 11.5–15.5)
WBC: 11.9 10*3/uL — ABNORMAL HIGH (ref 4.0–10.5)
nRBC: 0 % (ref 0.0–0.2)

## 2021-08-28 LAB — COMPREHENSIVE METABOLIC PANEL
ALT: 6 U/L (ref 0–44)
AST: 9 U/L — ABNORMAL LOW (ref 15–41)
Albumin: 1.9 g/dL — ABNORMAL LOW (ref 3.5–5.0)
Alkaline Phosphatase: 61 U/L (ref 38–126)
Anion gap: 8 (ref 5–15)
BUN: 22 mg/dL — ABNORMAL HIGH (ref 6–20)
CO2: 28 mmol/L (ref 22–32)
Calcium: 7.2 mg/dL — ABNORMAL LOW (ref 8.9–10.3)
Chloride: 100 mmol/L (ref 98–111)
Creatinine, Ser: 1.75 mg/dL — ABNORMAL HIGH (ref 0.44–1.00)
GFR, Estimated: 34 mL/min — ABNORMAL LOW (ref >60)
Glucose, Bld: 120 mg/dL — ABNORMAL HIGH (ref 70–99)
Potassium: 3.2 mmol/L — ABNORMAL LOW (ref 3.5–5.1)
Sodium: 136 mmol/L (ref 135–145)
Total Bilirubin: 0.8 mg/dL (ref 0.3–1.2)
Total Protein: 5.7 g/dL — ABNORMAL LOW (ref 6.5–8.1)

## 2021-08-28 LAB — TYPE AND SCREEN
ABO/RH(D): A POS
Antibody Screen: NEGATIVE
Unit division: 0
Unit division: 0

## 2021-08-28 LAB — GLUCOSE, CAPILLARY
Glucose-Capillary: 110 mg/dL — ABNORMAL HIGH (ref 70–99)
Glucose-Capillary: 114 mg/dL — ABNORMAL HIGH (ref 70–99)
Glucose-Capillary: 108 mg/dL — ABNORMAL HIGH (ref 70–99)
Glucose-Capillary: 199 mg/dL — ABNORMAL HIGH (ref 70–99)

## 2021-08-28 LAB — BPAM RBC
Blood Product Expiration Date: 202211042359
Blood Product Expiration Date: 202211042359
ISSUE DATE / TIME: 202210131030
ISSUE DATE / TIME: 202210131341
Unit Type and Rh: 6200
Unit Type and Rh: 6200

## 2021-08-28 LAB — MAGNESIUM: Magnesium: 1.9 mg/dL (ref 1.7–2.4)

## 2021-08-28 SURGERY — ESOPHAGOGASTRODUODENOSCOPY (EGD) WITH PROPOFOL
Anesthesia: General

## 2021-08-28 MED ORDER — SODIUM CHLORIDE 0.9 % IV SOLN: INTRAVENOUS | Status: DC

## 2021-08-28 MED ORDER — POTASSIUM CHLORIDE 10 MEQ/100ML IV SOLN
10.0000 meq | INTRAVENOUS | Status: AC
Start: 2021-08-28 — End: 2021-08-28
  Administered 2021-08-28 (×4): 10 meq via INTRAVENOUS
  Filled 2021-08-28 (×4): qty 100

## 2021-08-28 MED ORDER — SUCRALFATE 1 G PO TABS
1.0000 g | ORAL_TABLET | Freq: Three times a day (TID) | ORAL | Status: DC
  Administered 2021-08-28 – 2021-09-01 (×15): 1 g via ORAL
  Filled 2021-08-28 (×17): qty 1

## 2021-08-28 MED ORDER — MAGNESIUM SULFATE 2 GM/50ML IV SOLN
2.0000 g | Freq: Once | INTRAVENOUS | Status: AC
  Administered 2021-08-28: 2 g via INTRAVENOUS
  Filled 2021-08-28: qty 50

## 2021-08-28 MED ORDER — PROPOFOL 10 MG/ML IV BOLUS
INTRAVENOUS | Status: DC | PRN
  Administered 2021-08-28: 30 mg via INTRAVENOUS
  Administered 2021-08-28: 70 mg via INTRAVENOUS
  Administered 2021-08-28 (×2): 30 mg via INTRAVENOUS

## 2021-08-28 MED ORDER — FLUCONAZOLE IN SODIUM CHLORIDE 400-0.9 MG/200ML-% IV SOLN
400.0000 mg | Freq: Once | INTRAVENOUS | Status: AC
  Administered 2021-08-28: 400 mg via INTRAVENOUS
  Filled 2021-08-28 (×2): qty 200

## 2021-08-28 MED ORDER — LIDOCAINE HCL (CARDIAC) PF 100 MG/5ML IV SOSY
PREFILLED_SYRINGE | INTRAVENOUS | Status: DC | PRN
  Administered 2021-08-28: 80 mg via INTRAVENOUS

## 2021-08-28 NOTE — Progress Notes (Signed)
PROGRESS NOTE   Courtney Dyer  [id number] DOB: 12-Aug-1966 DOA: 08/26/2021 PCP: Pcp, No   Chief Complaint  Patient presents with   Hyperglycemia   Level of care: Telemetry  Brief Admission History:  55 y.o. female, with history of DMII, HLD, gout, and more presents to the ED with a chief complaint of generalized weakness.  Patient reports that this has been going on for about a week.  Today she was so weak she could not walk.  Patient normally ambulates with a cane.  She reports that sitting in her chair she felt woozy.  She admits to chronic nausea and vomiting, the last episode of emesis being yesterday.  Emesis is nonbloody.  Patient reports normal appetite.  Patient denies any chest pain, shortness of breath, cough.  She admits to epigastric abdominal pain that feels sharp.  Nothing makes it worse.  It was improved but not relieved with Pepto-Bismol.  Patient has no dysuria, and has noticed no change in urine output.  Patient does report chronic loose stools as well.  Her last bowel movement was day before yesterday, liquid, brown.  Having a bowel movement does make her abdominal pain better.  The nausea, and loose stools have been present on and off for 2 weeks.  The belly pain has been present for 1 week.  The weakness has been progressively worse for an unknown amount of time, but acutely worse today.  The only other complaint patient has is that her glucose was high in the 300s today.  This was postprandial.  Last hemoglobin A1c was 5.9, so 300 would be quite high for her.   Patient quit smoking and quit drinking 1 week ago.  She reports that up until then she was drinking 2 bottles of beer per day.  She has not had any tremors, hallucinations, seizures.  She reports that she does not use illicit drugs.  She is vaccinated for COVID.  Patient is full code.  Assessment & Plan:   Active Problems:   Hypokalemia   Hypomagnesemia   Intractable nausea and vomiting   Hypocalcemia    Generalized weakness   Alcohol abuse   Acute urinary retention   ABLA (acute blood loss anemia)   Gastrointestinal hemorrhage  Intractable nausea and vomiting - improving with supportive measures -As evidenced by severe electrolyte abnormalities seen on BMP -As evidenced by severe clinical dehydration -Appreciate GI consultation and recommendations for upper endoscopy later today -Replete electrolytes aggressively and provide IV fluid hydration  Hypokalemia/hypocalcemia/hyponatremia/hypomagnesemia -Aggressive replacement of electrolytes ordered -Continue IV fluid hydration -Monitor daily electrolytes studies -Continue telemetry monitoring, give additional IV Mg and K 10/14   Generalized weakness -Likely multifactorial given chronic alcoholism, malnutrition and severe electrolyte abnormalities -Replete IV fluids and electrolytes -PT evaluation requested -Consult dietitian -Vitamin supplements as ordered  Chronic alcohol abuse -Patient reports last alcohol consumption was 1 week ago -Continue CIWA protocol -Continue vitamin supplementation as ordered. -Consulted TOC regarding alcohol treatment options  Leukocytosis -Likely reactive, WBC trending down   Type 2 diabetes mellitus, diet controlled -Follow-up hemoglobin A1c -Monitor CBG CBG (last 3)  Recent Labs    08/27/21 1625 08/27/21 2201 08/28/21 0729  GLUCAP 201* 125* 110*    Normocytic anemia -GI consulted for upper endoscopy -Protonix ordered per GI recommendations -Repeat CBC in a.m. -Suspect nutritional component  Acute urinary retention  - I/O cath PRN if unable to void - try to avoid foley placement    DVT prophylaxis: SCDs Code Status: Full Family Communication:  None present Disposition: Anticipate home Status is: Inpatient  Remains inpatient appropriate because: IV fluid, IV electrolyte replacement and inpatient procedure required   Consultants:  Inpatient GI service   Procedures:  EGD 10/14  per GI   Antimicrobials:  N/a   Subjective: Patient reports feeling a bit better today and slept some overnight, agreeable to EGD today, some burning in arm from potassium infusion  Objective: Vitals:   08/27/21 1704 08/27/21 2200 08/27/21 2357 08/28/21 0524  BP: 110/67 97/76 117/76 111/71  Pulse: 94 (!) 105 97 (!) 106  Resp: 18  18   Temp: 97.6 F (36.4 C) 98.8 F (37.1 C) 98.4 F (36.9 C) 98.7 F (37.1 C)  TempSrc: Oral Oral Oral Oral  SpO2: 100% 100% 100% 100%  Weight:      Height:        Intake/Output Summary (Last 24 hours) at 08/28/2021 1009 Last data filed at 08/28/2021 0500 Gross per 24 hour  Intake 1584.58 ml  Output 1100 ml  Net 484.58 ml   Filed Weights   08/26/21 1343  Weight: 108.9 kg    Examination:  General exam: chronically ill, disheveled appearing female, Appears calm and comfortable  Respiratory system: Clear to auscultation. Respiratory effort normal. Cardiovascular system: normal S1 & S2 heard. No JVD, murmurs, rubs, gallops or clicks. No pedal edema. Gastrointestinal system: Abdomen is nondistended, soft and nontender. No organomegaly or masses felt. Normal bowel sounds heard. Central nervous system: Alert and oriented. No focal neurological deficits. Extremities: Symmetric 5 x 5 power. Skin: No rashes, lesions or ulcers Psychiatry: Judgement and insight appear poor. Mood & affect flat.   Data Reviewed: I have personally reviewed following labs and imaging studies  CBC: Recent Labs  Lab 08/26/21 1503 08/27/21 0610 08/28/21 0637  WBC 15.4* 11.7* 11.9*  NEUTROABS 13.5* 9.2* 8.9*  HGB 8.5* 6.7* 10.2*  HCT 25.6* 21.1* 31.2*  MCV 79.8* 81.2 83.4  PLT 147* 121* [id number]    Basic Metabolic Panel: Recent Labs  Lab 08/26/21 1503 08/27/21 0610 08/28/21 0637  NA 133* 133* 136  K 2.4* 2.4* 3.2*  CL 90* 95* 100  CO2 '28 27 28  '$ GLUCOSE 206* 107* 120*  BUN 21* 20 22*  CREATININE 2.11* 1.92* 1.75*  CALCIUM 5.2* 5.3* 7.2*  MG 0.7* 1.2* 1.9     GFR: Estimated Creatinine Clearance: 44.8 mL/min (A) (by C-G formula based on SCr of 1.75 mg/dL (H)).  Liver Function Tests: Recent Labs  Lab 08/26/21 1503 08/27/21 0610 08/28/21 0637  AST 18 12* 9*  ALT '6 7 6  '$ ALKPHOS 74 61 61  BILITOT 0.8 0.9 0.8  PROT 6.4* 5.6* 5.7*  ALBUMIN 2.1* 1.8* 1.9*    CBG: Recent Labs  Lab 08/27/21 0753 08/27/21 1109 08/27/21 1625 08/27/21 2201 08/28/21 0729  GLUCAP 100* 195* 201* 125* 110*    Recent Results (from the past 240 hour(s))  Resp Panel by RT-PCR (Flu A&B, Covid) Nasopharyngeal Swab     Status: None   Collection Time: 08/26/21  4:39 PM   Specimen: Nasopharyngeal Swab; Nasopharyngeal(NP) swabs in vial transport medium  Result Value Ref Range Status   SARS Coronavirus 2 by RT PCR NEGATIVE NEGATIVE Final    Comment: (NOTE) SARS-CoV-2 target nucleic acids are NOT DETECTED.  The SARS-CoV-2 RNA is generally detectable in upper respiratory specimens during the acute phase of infection. The lowest concentration of SARS-CoV-2 viral copies this assay can detect is 138 copies/mL. A negative result does not preclude SARS-Cov-2 infection and should  not be used as the sole basis for treatment or other patient management decisions. A negative result may occur with  improper specimen collection/handling, submission of specimen other than nasopharyngeal swab, presence of viral mutation(s) within the areas targeted by this assay, and inadequate number of viral copies(<138 copies/mL). A negative result must be combined with clinical observations, patient history, and epidemiological information. The expected result is Negative.  Fact Sheet for Patients:  EntrepreneurPulse.com.au  Fact Sheet for Healthcare Providers:  IncredibleEmployment.be  This test is no t yet approved or cleared by the [place] FDA and  has been authorized for detection and/or diagnosis of SARS-CoV-2 by FDA under an  Emergency Use Authorization (EUA). This EUA will remain  in effect (meaning this test can be used) for the duration of the COVID-19 declaration under Section 564(b)(1) of the Act, 21 U.S.C.section 360bbb-3(b)(1), unless the authorization is terminated  or revoked sooner.       Influenza A by PCR NEGATIVE NEGATIVE Final   Influenza B by PCR NEGATIVE NEGATIVE Final    Comment: (NOTE) The Xpert Xpress SARS-CoV-2/FLU/RSV plus assay is intended as an aid in the diagnosis of influenza from Nasopharyngeal swab specimens and should not be used as a sole basis for treatment. Nasal washings and aspirates are unacceptable for Xpert Xpress SARS-CoV-2/FLU/RSV testing.  Fact Sheet for Patients: EntrepreneurPulse.com.au  Fact Sheet for Healthcare Providers: IncredibleEmployment.be  This test is not yet approved or cleared by the [place] FDA and has been authorized for detection and/or diagnosis of SARS-CoV-2 by FDA under an Emergency Use Authorization (EUA). This EUA will remain in effect (meaning this test can be used) for the duration of the COVID-19 declaration under Section 564(b)(1) of the Act, 21 U.S.C. section 360bbb-3(b)(1), unless the authorization is terminated or revoked.  Performed at [facility], [address] [place], [place] [address]      Radiology Studies: CT ABDOMEN PELVIS WO CONTRAST  Result Date: 08/26/2021 CLINICAL DATA:  Nausea, weakness, abdominal pain EXAM: CT ABDOMEN AND PELVIS WITHOUT CONTRAST TECHNIQUE: Multidetector CT imaging of the abdomen and pelvis was performed following the standard protocol without IV contrast. COMPARISON:  08/06/2021 FINDINGS: Lower chest: No acute pleural or parenchymal lung disease. Hepatobiliary: Multiple small gallstones are identified, with no evidence of acute cholecystitis. Unenhanced imaging of the liver is unremarkable. Pancreas: Unremarkable. No pancreatic ductal dilatation or surrounding  inflammatory changes. Spleen: Normal in size without focal abnormality. Adrenals/Urinary Tract: Stable dysplastic right kidney. Scattered areas of cortical scarring are again seen within the left kidney, unchanged. No left-sided calculi or obstructive uropathy. Stable low-attenuation left adrenal nodule measuring 1.7 cm consistent with adenoma. The right adrenals unremarkable. Stomach/Bowel: No bowel obstruction or ileus. Normal appendix right lower quadrant. Scattered sigmoid diverticulosis without diverticulitis. No bowel wall thickening or inflammatory change. Vascular/Lymphatic: Aortic atherosclerosis. No enlarged abdominal or pelvic lymph nodes. Reproductive: Uterus and bilateral adnexa are unremarkable. Other: No free fluid or free gas. Stable small fat containing umbilical hernia. Musculoskeletal: No acute or destructive bony lesions. Reconstructed images demonstrate no additional findings. IMPRESSION: 1. Cholelithiasis without cholecystitis. 2. Minimal sigmoid diverticulosis without diverticulitis. 3. No bowel obstruction or ileus. 4. No urinary tract calculi or obstructive uropathy. Dysplastic right kidney unchanged. Electronically Signed   By: Randa Ngo M.D.   On: 08/26/2021 18:35   US RENAL  Result Date: 08/27/2021 CLINICAL DATA:  History of right nephrectomy, abnormal CT scan yesterday EXAM: RENAL / URINARY TRACT ULTRASOUND COMPLETE COMPARISON:  CT abdomen/pelvis 1 day prior FINDINGS: Right  Kidney: Surgically absent Left Kidney: Renal measurements: 9.9 cm x 5.8 cm x 5.8 cm = volume: 175 mL. Two anecoic lesions are seen measuring up to 1.4 cm consistent with cysts. There is an echogenic focus in the left kidney lower pole measuring 5 mm. Note that no stone was seen on the CT abdomen/pelvis from 1 day prior. There is no hydronephrosis. Bladder: Appears normal for degree of bladder distention. Other: Shadowing gallstones are incidentally noted in the gallbladder lumen. IMPRESSION: 1. 5 mm  echogenic focus in the left kidney is favored to be artifactual, as no stone was seen on the CT abdomen/pelvis from 1 day prior. No left hydronephrosis. 2. Two left renal cysts. 3. Status post right nephrectomy. 4. Cholelithiasis. Electronically Signed   By: Valetta Mole M.D.   On: 08/27/2021 09:07   DG Chest Port 1 View  Result Date: 08/26/2021 CLINICAL DATA:  Nausea and generalized weakness. EXAM: PORTABLE CHEST 1 VIEW COMPARISON:  August 06, 2021 FINDINGS: Low lung volumes are seen. There is no evidence of acute infiltrate, pleural effusion or pneumothorax. The heart size and mediastinal contours are within normal limits. Stable chronic and degenerative changes seen throughout the thoracic spine and bilateral shoulders. IMPRESSION: Low lung volumes without acute or active cardiopulmonary disease. Electronically Signed   By: [place] Norfolk M.D.   On: 08/26/2021 15:25    Scheduled Meds:  (feeding supplement) PROSource Plus  30 mL Oral TID BM   calcium carbonate  1 tablet Oral TID WC   feeding supplement  1 Container Oral TID BM   folic acid  1 mg Oral Daily   insulin aspart  0-15 Units Subcutaneous TID WC   insulin aspart  0-5 Units Subcutaneous QHS   LORazepam  0-4 mg Oral Q6H   Followed by   Derrill Memo ON 08/29/2021] LORazepam  0-4 mg Oral Q12H   magnesium oxide  400 mg Oral Daily   multivitamin with minerals  1 tablet Oral Daily   nystatin   Topical TID   pantoprazole (PROTONIX) IV  40 mg Intravenous Q12H   thiamine  100 mg Oral Daily   Or   thiamine  100 mg Intravenous Daily   Continuous Infusions:  lactated ringers 75 mL/hr at 08/27/21 0523   potassium chloride 10 mEq (08/28/21 0935)    LOS: 2 days   Time spent: 35 mins     Goddess Gebbia Wynetta Emery, MD How to contact the [facility] Attending or Consulting provider [place] or covering provider during after hours [place], for this patient?  Check the care team in [facility] and look for a) attending/consulting [facility] provider listed and b) the [facility]  team listed Log into [email or web address] and use [place]'s universal password to access. If you do not have the password, please contact the hospital operator. Locate the [facility] provider you are looking for under [facility] and page to a number that you can be directly reached. If you still have difficulty reaching the provider, please page the [facility] (Director on Call) for the Hospitalists listed on amion for assistance.  08/28/2021, 10:09 AM

## 2021-08-28 NOTE — Progress Notes (Signed)
Patient briefly seen in preparation for EGD/ED today.  Her electrolytes are much improved.  Magnesium normal.  Calcium much improved at 7.2, potassium 3.2.  Hemoglobin up to 10.2 after transfusion.  Vital signs stable.  She has no complaints this morning.    Will plan for EGD with possible esophageal dilation today.  I have discussed the risks, alternatives, benefits with regards to but not limited to the risk of reaction to medication, bleeding, infection, perforation and the patient is agreeable to proceed. Written consent to be obtained.  Laureen Ochs. Bernarda Caffey [facility] [phone] 10/14/20228:51 AM

## 2021-08-28 NOTE — Evaluation (Signed)
Physical Therapy Evaluation Patient Details Name: Courtney Dyer MRN: [id number] DOB: [birth year] Today's Date: 08/28/2021  History of Present Illness  Courtney Dyer  is a 55 y.o. female, with history of DMII, HLD, gout, and more presents to the ED with a chief complaint of generalized weakness.  Patient reports that this has been going on for about a week.  Today she was so weak she could not walk.  Patient normally ambulates with a cane.  She reports that sitting in her chair she felt woozy.  She admits to chronic nausea and vomiting, the last episode of emesis being yesterday.  Emesis is nonbloody.  Patient reports normal appetite.  Patient denies any chest pain, shortness of breath, cough.  She admits to epigastric abdominal pain that feels sharp.  Nothing makes it worse.  It was improved but not relieved with Pepto-Bismol.  Patient has no dysuria, and has noticed no change in urine output.  Patient does report chronic loose stools as well.  Her last bowel movement was day before yesterday, liquid, brown.  Having a bowel movement does make her abdominal pain better.  The nausea, and loose stools have been present on and off for 2 weeks.  The belly pain has been present for 1 week.  The weakness has been progressively worse for an unknown amount of time, but acutely worse today.  The only other complaint patient has is that her glucose was high in the 300s today.  This was postprandial.  Last hemoglobin A1c was 5.9, so 300 would be quite high for her.    Clinical Impression  Patient limited for functional mobility as stated below secondary to BLE weakness, fatigue and poor standing balance. Patient requires HOB elevated and increased time to transition to seated EOB with intermittent cueing. Patient demonstrates good sitting balance EOB. Patient requires cueing, RW and mod assist to transfer to standing. She demonstrates poor standing balance and standing tolerance with RW and is limited by fatigue.  Patient will benefit from continued physical therapy in hospital and recommended venue below to increase strength, balance, endurance for safe ADLs and gait.        Recommendations for follow up therapy are one component of a multi-disciplinary discharge planning process, led by the attending physician.  Recommendations may be updated based on patient status, additional functional criteria and insurance authorization.  Follow Up Recommendations SNF    Equipment Recommendations  None recommended by PT    Recommendations for Other Services       Precautions / Restrictions Precautions Precautions: Fall Restrictions Weight Bearing Restrictions: No      Mobility  Bed Mobility Overal bed mobility: Modified Independent             General bed mobility comments: requires HOB elevated, slow, labored, use of bed rails    Transfers   Equipment used: Rolling walker (2 wheeled) Transfers: Sit to/from Stand Sit to Stand: Mod assist         General transfer comment: slow labored movement, cueing for sequencing and proper RW use  Ambulation/Gait                Stairs            Wheelchair Mobility    Modified Rankin (Stroke Patients Only)       Balance Overall balance assessment: Needs assistance Sitting-balance support: Feet supported;No upper extremity supported Sitting balance-Leahy Scale: Good Sitting balance - Comments: seated at EOB   Standing balance support: Bilateral upper  extremity supported Standing balance-Leahy Scale: Fair Standing balance comment: fair/poor with RW                             Pertinent Vitals/Pain Pain Assessment: No/denies pain    Home Living Family/patient expects to be discharged to:: Private residence Living Arrangements: Other relatives Available Help at Discharge: Available 24 hours/day Type of Home: House Home Access: Stairs to enter Entrance Stairs-Rails: None Entrance Stairs-Number of Steps:  1 Home Layout: One level Home Equipment: Cane - single point;Walker - 2 wheels      Prior Function Level of Independence: Independent with assistive device(s)         Comments: Household and short distanced community ambulator using tri-pod Mining engineer Dominance        Extremity/Trunk Assessment   Upper Extremity Assessment Upper Extremity Assessment: Generalized weakness    Lower Extremity Assessment Lower Extremity Assessment: Generalized weakness    Cervical / Trunk Assessment Cervical / Trunk Assessment: Normal  Communication   Communication: No difficulties  Cognition Arousal/Alertness: Awake/alert Behavior During Therapy: WFL for tasks assessed/performed Overall Cognitive Status: Within Functional Limits for tasks assessed                                        General Comments      Exercises     Assessment/Plan    PT Assessment Patient needs continued PT services  PT Problem List Decreased strength;Decreased activity tolerance;Decreased balance;Decreased mobility       PT Treatment Interventions DME instruction;Gait training;Stair training;Functional mobility training;Therapeutic activities;Therapeutic exercise;Balance training;Patient/family education;Neuromuscular re-education    PT Goals (Current goals can be found in the Care Plan section)  Acute Rehab PT Goals Patient Stated Goal: return home PT Goal Formulation: With patient Time For Goal Achievement: 09/11/21 Potential to Achieve Goals: Fair    Frequency Min 3X/week   Barriers to discharge        Co-evaluation               AM-PAC PT "6 Clicks" Mobility  Outcome Measure Help needed turning from your back to your side while in a flat bed without using bedrails?: None Help needed moving from lying on your back to sitting on the side of a flat bed without using bedrails?: A Little Help needed moving to and from a bed to a chair (including a wheelchair)?: A  Lot Help needed standing up from a chair using your arms (e.g., wheelchair or bedside chair)?: A Lot Help needed to walk in hospital room?: A Lot Help needed climbing 3-5 steps with a railing? : A Lot 6 Click Score: 15    End of Session   Activity Tolerance: Patient tolerated treatment well;Patient limited by fatigue Patient left: in bed;with call bell/phone within reach;with bed alarm set Nurse Communication: Mobility status PT Visit Diagnosis: Unsteadiness on feet (R26.81);Other abnormalities of gait and mobility (R26.89);Muscle weakness (generalized) (M62.81)    Time: [id number] PT Time Calculation (min) (ACUTE ONLY): 8 min   Charges:   PT Evaluation $PT Eval Moderate Complexity: 1 Mod         10:09 AM, 08/28/21 Mearl Latin PT, DPT Physical Therapist at [facility]

## 2021-08-28 NOTE — Transfer of Care (Signed)
Immediate Anesthesia Transfer of Care Note  Patient: Courtney Dyer  Procedure(s) Performed: ESOPHAGOGASTRODUODENOSCOPY (EGD) WITH PROPOFOL BIOPSY  Patient Location: Endoscopy Unit  Anesthesia Type:General  Level of Consciousness: awake  Airway & Oxygen Therapy: Patient Spontanous Breathing  Post-op Assessment: Report given to RN and Post -op Vital signs reviewed and stable  Post vital signs: Reviewed and stable  Last Vitals:  Vitals Value Taken Time  BP    Temp    Pulse    Resp    SpO2 96%     Last Pain:  Vitals:   08/28/21 1356  TempSrc:   PainSc: 0-No pain      Patients Stated Pain Goal: 5 ([id number])  Complications: No notable events documented.

## 2021-08-28 NOTE — Anesthesia Preprocedure Evaluation (Signed)
Anesthesia Evaluation  Patient identified by MRN, date of birth, ID band Patient awake    Reviewed: Allergy & Precautions, NPO status , Patient's Chart, lab work & pertinent test results  History of Anesthesia Complications Negative for: history of anesthetic complications  Airway Mallampati: IV  TM Distance: >3 FB Neck ROM: Full    Dental  (+) Dental Advisory Given, Missing, Poor Dentition   Pulmonary Current Smoker and Patient abstained from smoking.,    Pulmonary exam normal breath sounds clear to auscultation       Cardiovascular Exercise Tolerance: Poor Normal cardiovascular exam Rhythm:Regular Rate:Normal  26-Aug-2021 14:12:01 [place] [facility] ROUTINE RECORD [birth year] (12 yr) Female Black Vent. rate 106 BPM PR interval 139 ms QRS duration 86 ms QT/QTcB 366/486 ms P-R-T axes 24 -2 172 Sinus tachycardia Probable left atrial enlargement Abnormal R-wave progression, early transition Abnormal T, consider ischemia, lateral leads new ischemic changes compared with prior 9/22 Confirmed by Aletta Edouard [phone]) on 08/26/2021 2:18:26 PM   Neuro/Psych negative psych ROS   GI/Hepatic negative GI ROS, (+)     substance abuse  alcohol use,   Endo/Other  diabetes, Well Controlled, Type 2Morbid obesity  Renal/GU Renal InsufficiencyRenal disease (AKI)     Musculoskeletal   Abdominal   Peds  Hematology  (+) anemia ,   Anesthesia Other Findings   Reproductive/Obstetrics                            Anesthesia Physical Anesthesia Plan  ASA: 3  Anesthesia Plan: General   Post-op Pain Management:    Induction: Intravenous  PONV Risk Score and Plan: TIVA  Airway Management Planned: Nasal Cannula, Natural Airway and Simple Face Mask  Additional Equipment:   Intra-op Plan:   Post-operative Plan: Possible Post-op intubation/ventilation  Informed Consent: I have  reviewed the patients History and Physical, chart, labs and discussed the procedure including the risks, benefits and alternatives for the proposed anesthesia with the patient or authorized representative who has indicated his/her understanding and acceptance.     Dental advisory given  Plan Discussed with: CRNA and Surgeon  Anesthesia Plan Comments:        Anesthesia Quick Evaluation

## 2021-08-28 NOTE — Plan of Care (Signed)
  Problem: Acute Rehab PT Goals(only PT should resolve) Goal: Patient Will Transfer Sit To/From Stand Outcome: Progressing Flowsheets (Taken 08/28/2021 1012) Patient will transfer sit to/from stand:  with min guard assist  with supervision Goal: Pt Will Transfer Bed To Chair/Chair To Bed Outcome: Progressing Flowsheets (Taken 08/28/2021 1012) Pt will Transfer Bed to Chair/Chair to Bed:  min guard assist  with supervision Goal: Pt Will Ambulate Outcome: Progressing Flowsheets (Taken 08/28/2021 1012) Pt will Ambulate:  25 feet  with min guard assist  with supervision  with rolling walker Goal: Pt/caregiver will Perform Home Exercise Program Outcome: Progressing Flowsheets (Taken 08/28/2021 1012) Pt/caregiver will Perform Home Exercise Program:  For increased strengthening  For improved balance  Independently  10:13 AM, 08/28/21 Mearl Latin PT, DPT Physical Therapist at [facility]

## 2021-08-28 NOTE — Anesthesia Postprocedure Evaluation (Signed)
Anesthesia Post Note  Patient: Courtney Dyer  Procedure(s) Performed: ESOPHAGOGASTRODUODENOSCOPY (EGD) WITH PROPOFOL BIOPSY  Patient location during evaluation: PACU Anesthesia Type: General Level of consciousness: awake and alert and oriented Pain management: pain level controlled Vital Signs Assessment: post-procedure vital signs reviewed and stable Respiratory status: spontaneous breathing, nonlabored ventilation, respiratory function stable and patient connected to nasal cannula oxygen Cardiovascular status: blood pressure returned to baseline and stable Postop Assessment: no apparent nausea or vomiting Anesthetic complications: no   No notable events documented.   Last Vitals:  Vitals:   08/28/21 1430 08/28/21 1445  BP: 101/66 112/68  Pulse: (!) 104 98  Resp: (!) 21 20  Temp:    SpO2: 97% 100%    Last Pain:  Vitals:   08/28/21 1445  TempSrc:   PainSc: 0-No pain                 Shomari Matusik C Keiona Jenison

## 2021-08-28 NOTE — Op Note (Addendum)
[facility] Patient Name: Courtney Dyer Procedure Date: 08/28/2021 1:49 PM MRN: [id number] Date of Birth: 1966/09/15 Attending MD: Maylon Peppers ,  CSN: [id number] Age: 55 Admit Type: Inpatient Procedure:                Upper GI endoscopy Indications:              Abdominal pain, Dysphagia Providers:                Maylon Peppers, Caprice Kluver, Casimer Bilis,                            Technician Referring MD:              Medicines:                Monitored Anesthesia Care Complications:            No immediate complications. Estimated Blood Loss:     Estimated blood loss: none. Procedure:                Pre-Anesthesia Assessment:                           - Prior to the procedure, a History and Physical                            was performed, and patient medications, allergies                            and sensitivities were reviewed. The patient's                            tolerance of previous anesthesia was reviewed.                           - The risks and benefits of the procedure and the                            sedation options and risks were discussed with the                            patient. All questions were answered and informed                            consent was obtained.                           - ASA Grade Assessment: III - A patient with severe                            systemic disease.                           After obtaining informed consent, the endoscope was                            passed under direct vision. Throughout the  procedure, the patient's blood pressure, pulse, and                            oxygen saturations were monitored continuously. The                            [id number] scope was introduced through the                            mouth, and advanced to the second part of duodenum.                            The upper GI endoscopy was accomplished without                             difficulty. The patient tolerated the procedure                            well. Scope In: 2:00:33 PM Scope Out: 2:15:49 PM Total Procedure Duration: 0 hours 15 minutes 16 seconds  Findings:      LA Grade D (one or more mucosal breaks involving at least 75% of       esophageal circumference) esophagitis with bleeding was found in the       entire esophagus. There were some interspaced area of white patches       which were also concerning for possible candidiasis. Biopsies were taken       with a cold forceps for histology. To prevent bleeding after the biopsy,       two hemostatic clips were successfully placed above the GE junction as       there was persistent oozing from this area. There was no bleeding at the       end of the procedure.      Patchy moderate inflammation characterized by congestion (edema) and       erythema was found in the gastric antrum. Biopsies were taken with a       cold forceps for Helicobacter pylori testing.      Diffuse moderate inflammation characterized by congestion (edema) and       erythema was found in the duodenal bulb and in the first portion of the       duodenum. Impression:               - LA Grade D esophagitis with bleeding. Biopsied.                            Clips were placed.                           - Normal stomach.                           - Normal examined duodenum. Moderate Sedation:      Per Anesthesia Care Recommendation:           - Return patient to hospital ward for ongoing care.                           -  Clear liquid diet today.                           - Use Protonix (pantoprazole) 40 mg IV daily.                           - Use sucralfate tablets 1 gram PO QID for 2 weeks.                           - Await pathology results. Procedure Code(s):        --- Professional ---                           [phone], Esophagogastroduodenoscopy, flexible,                            transoral; with biopsy, single or  multiple Diagnosis Code(s):        --- Professional ---                           K20.91, Esophagitis, unspecified with bleeding                           R10.9, Unspecified abdominal pain                           R13.10, Dysphagia, unspecified CPT copyright 2019 American Medical Association. All rights reserved. The codes documented in this report are preliminary and upon coder review may  be revised to meet current compliance requirements. Maylon Peppers, MD Maylon Peppers,  08/28/2021 6:07:44 PM This report has been signed electronically. Number of Addenda: 0

## 2021-08-28 NOTE — Brief Op Note (Signed)
08/26/2021 - 08/28/2021  6:11 PM  PATIENT:  Courtney Dyer  55 y.o. female  PRE-OPERATIVE DIAGNOSIS:  epigastric pain, acute on chronic anemia, dysphagia, N/V  POST-OPERATIVE DIAGNOSIS:  duodenitis, gastritis, possible candida, hiatal hernia, schatzki's ring broken with bx forceps  PROCEDURE:  Procedure(s): ESOPHAGOGASTRODUODENOSCOPY (EGD) WITH PROPOFOL (N/A) BIOPSY  SURGEON:  Surgeon(s) and Role:    * Harvel Quale, MD - Primary  Patient underwent EGD under peripheral sedation.  Tolerated the procedure adequately.  Esophagus showed presence of LA Grade D (one or more mucosal breaks involving at least 75% of esophageal circumference) esophagitis with bleeding was found in the entire esophagus. There were some interspaced area of white patches which were also concerning for possible candidiasis.  Biopsies were taken with a cold forceps for histology.  To prevent bleeding after the biopsy, two hemostatic clips were successfully placed above the GE junction as there was persistent oozing from this area.  There was no bleeding at the end of the procedure.  There was presence of erythema and edema in the gastric antrum, biopsies were taken to rule out H. pylori.  There was also presence of duodenitis.    RECOMMENDATIONS: - Return patient to hospital ward for ongoing care.  - Clear liquid diet today.  - Use Protonix (pantoprazole) 40 mg IV daily.  - Use sucralfate tablets 1 gram PO QID for 2 weeks.  - Await pathology results.   Maylon Peppers, MD Gastroenterology and Hepatology [facility]

## 2021-08-29 DIAGNOSIS — D500 Iron deficiency anemia secondary to blood loss (chronic): Secondary | ICD-10-CM | POA: Diagnosis not present

## 2021-08-29 DIAGNOSIS — K209 Esophagitis, unspecified without bleeding: Secondary | ICD-10-CM | POA: Diagnosis not present

## 2021-08-29 DIAGNOSIS — R112 Nausea with vomiting, unspecified: Secondary | ICD-10-CM | POA: Diagnosis not present

## 2021-08-29 LAB — COMPREHENSIVE METABOLIC PANEL
ALT: 7 U/L (ref 0–44)
AST: 11 U/L — ABNORMAL LOW (ref 15–41)
Albumin: 1.8 g/dL — ABNORMAL LOW (ref 3.5–5.0)
Alkaline Phosphatase: 63 U/L (ref 38–126)
Anion gap: 6 (ref 5–15)
BUN: 20 mg/dL (ref 6–20)
CO2: 28 mmol/L (ref 22–32)
Calcium: 7.6 mg/dL — ABNORMAL LOW (ref 8.9–10.3)
Chloride: 101 mmol/L (ref 98–111)
Creatinine, Ser: 1.72 mg/dL — ABNORMAL HIGH (ref 0.44–1.00)
GFR, Estimated: 35 mL/min — ABNORMAL LOW (ref >60)
Glucose, Bld: 82 mg/dL (ref 70–99)
Potassium: 3.8 mmol/L (ref 3.5–5.1)
Sodium: 135 mmol/L (ref 135–145)
Total Bilirubin: 0.7 mg/dL (ref 0.3–1.2)
Total Protein: 5.8 g/dL — ABNORMAL LOW (ref 6.5–8.1)

## 2021-08-29 LAB — CBC WITH DIFFERENTIAL/PLATELET
Abs Immature Granulocytes: 0.15 10*3/uL — ABNORMAL HIGH (ref 0.00–0.07)
Basophils Absolute: 0.1 10*3/uL (ref 0.0–0.1)
Basophils Relative: 1 %
Eosinophils Absolute: 0.1 10*3/uL (ref 0.0–0.5)
Eosinophils Relative: 1 %
HCT: 31.6 % — ABNORMAL LOW (ref 36.0–46.0)
Hemoglobin: 10.4 g/dL — ABNORMAL LOW (ref 12.0–15.0)
Immature Granulocytes: 1 %
Lymphocytes Relative: 18 %
Lymphs Abs: 2.4 10*3/uL (ref 0.7–4.0)
MCH: 27.9 pg (ref 26.0–34.0)
MCHC: 32.9 g/dL (ref 30.0–36.0)
MCV: 84.7 fL (ref 80.0–100.0)
Monocytes Absolute: 0.6 10*3/uL (ref 0.1–1.0)
Monocytes Relative: 4 %
Neutro Abs: 9.9 10*3/uL — ABNORMAL HIGH (ref 1.7–7.7)
Neutrophils Relative %: 75 %
Platelets: 168 10*3/uL (ref 150–400)
RBC: 3.73 MIL/uL — ABNORMAL LOW (ref 3.87–5.11)
RDW: 15.5 % (ref 11.5–15.5)
WBC: 13.3 10*3/uL — ABNORMAL HIGH (ref 4.0–10.5)
nRBC: 0 % (ref 0.0–0.2)

## 2021-08-29 LAB — MAGNESIUM: Magnesium: 2.1 mg/dL (ref 1.7–2.4)

## 2021-08-29 LAB — GLUCOSE, CAPILLARY
Glucose-Capillary: 96 mg/dL (ref 70–99)
Glucose-Capillary: 134 mg/dL — ABNORMAL HIGH (ref 70–99)
Glucose-Capillary: 102 mg/dL — ABNORMAL HIGH (ref 70–99)
Glucose-Capillary: 97 mg/dL (ref 70–99)

## 2021-08-29 NOTE — TOC Progression Note (Signed)
Transition of Care [facility]) - Progression Note    Patient Details  Name: Courtney Dyer MRN: [id number] Date of Birth: Dec 26, 1965  Transition of Care [facility]) CM/SW Contact  Natasha Bence, LCSW Phone Number: 08/29/2021, 4:27 PM  Clinical Narrative:    CSW contacted patient's Aunt to inquire about agreeableness to SNF. Patient's aunt reported that patient and family are agreeable to SNF referrals to [facility], [facility] and [facility]. CSW completed FL2 and faxed patient. TOC to follow.    Expected Discharge Plan: [place] Barriers to Discharge: Continued Medical Work up  Expected Discharge Plan and Services Expected Discharge Plan: [place] In-house Referral: Clinical Social Work     Living arrangements for the past 2 months: Single Family Home                 DME Arranged: N/A                     Social Determinants of Health (SDOH) Interventions    Readmission Risk Interventions Readmission Risk Prevention Plan 08/27/2021 04/27/2021  Medication Screening - Complete  Transportation Screening Complete Complete  Home Care Screening Complete -  Medication Review (RN CM) Complete -  Some recent data might be hidden

## 2021-08-29 NOTE — NC FL2 (Signed)
[place] MEDICAID FL2 LEVEL OF CARE SCREENING TOOL     IDENTIFICATION  Patient Name: Courtney Dyer Birthdate: 24-Jun-1966 Sex: female Admission Date (Current Location): 08/26/2021  [facility] and [place] Number:  [employer] and Address:  [place] [address], [place]      Provider Number: [id number]  Attending Physician Name and Address:  Murlean Iba, MD  Relative Name and Phone Number:  Moore,Carolyn [facility])   [phone]    Current Level of Care: SNF Recommended Level of Care: [place] Prior Approval Number:    Date Approved/Denied:   PASRR Number: PASSR Waiver in effect ( not able to utilize [place] Must for Search or PASSR submission) hx of ID, Substance Abuse, or Mental Health Disorder  Discharge Plan: SNF    Current Diagnoses: Patient Active Problem List   Diagnosis Date Noted   ABLA (acute blood loss anemia)    Gastrointestinal hemorrhage    Intractable nausea and vomiting 08/26/2021   Hypocalcemia 08/26/2021   Generalized weakness 08/26/2021   Alcohol abuse 08/26/2021   Acute urinary retention 08/26/2021   CKD (chronic kidney disease) stage 3, GFR 30-59 ml/min ([place]) 08/08/2021   Candidal intertrigo 08/06/2021   Adrenal mass ([place]) 08/06/2021   Intractable vomiting    AKI (acute kidney injury) ([place]) 04/23/2021   Hypokalemia 04/23/2021   Chronic anemia 04/23/2021   Hypomagnesemia 04/23/2021   High cholesterol     Orientation RESPIRATION BLADDER Height & Weight     Self, Time, Situation, Place  Normal Incontinent Weight: 240 lb (108.9 kg) Height:  '5\' 4"'$  (162.6 cm)  BEHAVIORAL SYMPTOMS/MOOD NEUROLOGICAL BOWEL NUTRITION STATUS      Continent Diet (Diet clear liquid Room service appropriate? Yes; Fluid consistency: Thin)  AMBULATORY STATUS COMMUNICATION OF NEEDS Skin   Extensive Assist Verbally Normal                       Personal Care Assistance Level of Assistance  Bathing,  Feeding, Dressing Bathing Assistance: Maximum assistance Feeding assistance: Independent Dressing Assistance: Maximum assistance     Functional Limitations Info  Sight, Hearing, Speech Sight Info: Adequate Hearing Info: Adequate Speech Info: Adequate    SPECIAL CARE FACTORS FREQUENCY  PT (By licensed PT), OT (By licensed OT)     PT Frequency: 5x OT Frequency: 5x            Contractures Contractures Info: Not present    Additional Factors Info  Code Status, Allergies Code Status Info: Full Allergies Info: N/A           Current Medications (08/29/2021):  This is the current hospital active medication list Current Facility-Administered Medications  Medication Dose Route Frequency Provider Last Rate Last Admin   (feeding supplement) PROSource Plus liquid 30 mL  30 mL Oral TID BM Johnson, Clanford L, MD   30 mL at 08/29/21 0916   acetaminophen (TYLENOL) tablet 650 mg  650 mg Oral Q6H PRN Zierle-Ghosh, Asia B, DO       Or   acetaminophen (TYLENOL) suppository 650 mg  650 mg Rectal Q6H PRN Zierle-Ghosh, Asia B, DO       calcium carbonate (TUMS - dosed in mg elemental calcium) chewable tablet 200 mg of elemental calcium  1 tablet Oral TID WC Johnson, Clanford L, MD   200 mg of elemental calcium at 08/29/21 1245   feeding supplement (BOOST / RESOURCE BREEZE) liquid 1 Container  1 Container Oral TID BM  Murlean Iba, MD   1 Container at 08/27/21 2000   fentaNYL (SUBLIMAZE) injection 12.5-25 mcg  12.5-25 mcg Intravenous Q2H PRN Wynetta Emery, Clanford L, MD       folic acid (FOLVITE) tablet 1 mg  1 mg Oral Daily Zierle-Ghosh, Asia B, DO   1 mg at 08/29/21 0915   insulin aspart (novoLOG) injection 0-15 Units  0-15 Units Subcutaneous TID WC Zierle-Ghosh, Asia B, DO   2 Units at 08/29/21 1246   insulin aspart (novoLOG) injection 0-5 Units  0-5 Units Subcutaneous QHS Zierle-Ghosh, Asia B, DO       lactated ringers infusion   Intravenous Continuous Johnson, Clanford L, MD 50 mL/hr at  08/29/21 0748 Rate Change at 08/29/21 0748   LORazepam (ATIVAN) tablet 1-4 mg  1-4 mg Oral Q1H PRN Zierle-Ghosh, Asia B, DO       Or   LORazepam (ATIVAN) injection 1-4 mg  1-4 mg Intravenous Q1H PRN Zierle-Ghosh, Asia B, DO       LORazepam (ATIVAN) tablet 0-4 mg  0-4 mg Oral Q12H Zierle-Ghosh, Asia B, DO   1 mg at 08/29/21 1245   magnesium oxide (MAG-OX) tablet 400 mg  400 mg Oral Daily Zierle-Ghosh, Asia B, DO   400 mg at 08/29/21 0915   multivitamin with minerals tablet 1 tablet  1 tablet Oral Daily Zierle-Ghosh, Asia B, DO   1 tablet at 08/29/21 0915   nystatin (MYCOSTATIN/NYSTOP) topical powder   Topical TID Zierle-Ghosh, Asia B, DO   Given at 08/28/21 2039   ondansetron (ZOFRAN) tablet 4 mg  4 mg Oral Q6H PRN Zierle-Ghosh, Asia B, DO       Or   ondansetron (ZOFRAN) injection 4 mg  4 mg Intravenous Q6H PRN Zierle-Ghosh, Asia B, DO       pantoprazole (PROTONIX) injection 40 mg  40 mg Intravenous Q12H Mahala Menghini, PA-C   40 mg at 08/29/21 0920   sucralfate (CARAFATE) tablet 1 g  1 g Oral TID WC & HS Harvel Quale, MD   1 g at 08/29/21 1245   thiamine tablet 100 mg  100 mg Oral Daily Zierle-Ghosh, Asia B, DO   100 mg at 08/29/21 0915   Or   thiamine (B-1) injection 100 mg  100 mg Intravenous Daily Zierle-Ghosh, Asia B, DO         Discharge Medications: Please see discharge summary for a list of discharge medications.  Relevant Imaging Results:  Relevant Lab Results:   Additional Information Pt SSN [id number]  Natasha Bence, LCSW

## 2021-08-29 NOTE — Progress Notes (Signed)
Maylon Peppers, M.D. Gastroenterology & Hepatology   Interval History:  No acute events overnight. Patient tolerated diet adequately, denies having any complaints of nausea or vomiting. Remained stable without hemoglobin of 10.4 today. Underwent EGD yesterday which showed presence of grade D esophagitis with possible fungal components which were biopsied, 1 area was clipped x1 as he had persistent oozing after performing the biopsy.  Also had presence of gastritis and duodenitis, gastric biopsies were performed.  Inpatient Medications:  Current Facility-Administered Medications:    (feeding supplement) PROSource Plus liquid 30 mL, 30 mL, Oral, TID BM, Johnson, Clanford L, MD, 30 mL at 08/27/21 1723   acetaminophen (TYLENOL) tablet 650 mg, 650 mg, Oral, Q6H PRN **OR** acetaminophen (TYLENOL) suppository 650 mg, 650 mg, Rectal, Q6H PRN, Zierle-Ghosh, Asia B, DO   calcium carbonate (TUMS - dosed in mg elemental calcium) chewable tablet 200 mg of elemental calcium, 1 tablet, Oral, TID WC, Johnson, Clanford L, MD, 200 mg of elemental calcium at 08/28/21 1625   feeding supplement (BOOST / RESOURCE BREEZE) liquid 1 Container, 1 Container, Oral, TID BM, Johnson, Clanford L, MD, 1 Container at 08/27/21 2000   fentaNYL (SUBLIMAZE) injection 12.5-25 mcg, 12.5-25 mcg, Intravenous, Q2H PRN, Wynetta Emery, Clanford L, MD   folic acid (FOLVITE) tablet 1 mg, 1 mg, Oral, Daily, Zierle-Ghosh, Asia B, DO, 1 mg at 08/27/21 0803   insulin aspart (novoLOG) injection 0-15 Units, 0-15 Units, Subcutaneous, TID WC, Zierle-Ghosh, Asia B, DO, 5 Units at 08/27/21 1724   insulin aspart (novoLOG) injection 0-5 Units, 0-5 Units, Subcutaneous, QHS, Zierle-Ghosh, Asia B, DO   lactated ringers infusion, , Intravenous, Continuous, Johnson, Clanford L, MD, Last Rate: 50 mL/hr at 08/29/21 0748, Rate Change at 08/29/21 0748   LORazepam (ATIVAN) tablet 1-4 mg, 1-4 mg, Oral, Q1H PRN **OR** LORazepam (ATIVAN) injection 1-4 mg, 1-4 mg,  Intravenous, Q1H PRN, Zierle-Ghosh, Asia B, DO   [EXPIRED] LORazepam (ATIVAN) tablet 0-4 mg, 0-4 mg, Oral, Q6H **FOLLOWED BY** LORazepam (ATIVAN) tablet 0-4 mg, 0-4 mg, Oral, Q12H, Zierle-Ghosh, Asia B, DO   magnesium oxide (MAG-OX) tablet 400 mg, 400 mg, Oral, Daily, Zierle-Ghosh, Asia B, DO, 400 mg at 08/27/21 0803   multivitamin with minerals tablet 1 tablet, 1 tablet, Oral, Daily, Zierle-Ghosh, Asia B, DO, 1 tablet at 08/27/21 0803   nystatin (MYCOSTATIN/NYSTOP) topical powder, , Topical, TID, Zierle-Ghosh, Asia B, DO, Given at 08/28/21 2039   ondansetron (ZOFRAN) tablet 4 mg, 4 mg, Oral, Q6H PRN **OR** ondansetron (ZOFRAN) injection 4 mg, 4 mg, Intravenous, Q6H PRN, Zierle-Ghosh, Asia B, DO   pantoprazole (PROTONIX) injection 40 mg, 40 mg, Intravenous, Q12H, Mahala Menghini, PA-C, 40 mg at 08/28/21 2037   sucralfate (CARAFATE) tablet 1 g, 1 g, Oral, TID WC & HS, Montez Morita, Zian Mohamed, MD, 1 g at 08/28/21 2037   thiamine tablet 100 mg, 100 mg, Oral, Daily, 100 mg at 08/27/21 0803 **OR** thiamine (B-1) injection 100 mg, 100 mg, Intravenous, Daily, Zierle-Ghosh, Asia B, DO   I/O    Intake/Output Summary (Last 24 hours) at 08/29/2021 0907 Last data filed at 08/29/2021 0500 Gross per 24 hour  Intake 1381.43 ml  Output 1600 ml  Net -218.57 ml     Physical Exam: Temp:  [97.8 F (36.6 C)-98.3 F (36.8 C)] 98.3 F (36.8 C) (10/14 2047) Pulse Rate:  [79-112] 95 (10/15 0517) Resp:  [18-27] 20 (10/14 1445) BP: (98-125)/(65-77) 108/76 (10/15 0517) SpO2:  [97 %-100 %] 99 % (10/15 0517)  Temp (24hrs), Avg:98 F (36.7 C), Min:97.8 F (  36.6 C), Max:98.3 F (36.8 C) GENERAL: The patient is AO x3, in no acute distress. Looks frail. HEENT: Head is normocephalic and atraumatic. EOMI are intact. Mouth is well hydrated and without lesions. NECK: Supple. No masses LUNGS: Clear to auscultation. No presence of rhonchi/wheezing/rales. Adequate chest expansion HEART: RRR, normal s1 and  s2. ABDOMEN: Soft, nontender, no guarding, no peritoneal signs, and nondistended. BS +. No masses. EXTREMITIES: Without any cyanosis, clubbing, rash, lesions or edema. NEUROLOGIC: AOx3, no focal motor deficit. SKIN: no jaundice, no rashes  Laboratory Data: CBC:     Component Value Date/Time   WBC 11.9 (H) 08/28/2021 0637   RBC 3.74 (L) 08/28/2021 0637   HGB 10.2 (L) 08/28/2021 0637   HCT 31.2 (L) 08/28/2021 0637   PLT 154 08/28/2021 0637   MCV 83.4 08/28/2021 0637   MCH 27.3 08/28/2021 0637   MCHC 32.7 08/28/2021 0637   RDW 14.7 08/28/2021 0637   LYMPHSABS 2.2 08/28/2021 0637   MONOABS 0.5 08/28/2021 0637   EOSABS 0.1 08/28/2021 0637   BASOSABS 0.0 08/28/2021 0637   COAG:  Lab Results  Component Value Date   INR 1.1 08/06/2021    BMP:  BMP Latest Ref Rng & Units 08/29/2021 08/28/2021 08/27/2021  Glucose 70 - 99 mg/dL 82 120(H) 107(H)  BUN 6 - 20 mg/dL 20 22(H) 20  Creatinine 0.44 - 1.00 mg/dL 1.72(H) 1.75(H) 1.92(H)  Sodium 135 - 145 mmol/L 135 136 133(L)  Potassium 3.5 - 5.1 mmol/L 3.8 3.2(L) 2.4(LL)  Chloride 98 - 111 mmol/L 101 100 95(L)  CO2 22 - 32 mmol/L '28 28 27  ' Calcium 8.9 - 10.3 mg/dL 7.6(L) 7.2(L) 5.3(LL)    HEPATIC:  Hepatic Function Latest Ref Rng & Units 08/29/2021 08/28/2021 08/27/2021  Total Protein 6.5 - 8.1 g/dL 5.8(L) 5.7(L) 5.6(L)  Albumin 3.5 - 5.0 g/dL 1.8(L) 1.9(L) 1.8(L)  AST 15 - 41 U/L 11(L) 9(L) 12(L)  ALT 0 - 44 U/L '7 6 7  ' Alk Phosphatase 38 - 126 U/L 63 61 61  Total Bilirubin 0.3 - 1.2 mg/dL 0.7 0.8 0.9    CARDIAC:  Lab Results  Component Value Date   TROPONINI <0.03 11/26/2015      Imaging: I personally reviewed and interpreted the available labs, imaging and endoscopic files.   Assessment/Plan: 55 year old female with past medical history of diabetes, gout, hyperlipidemia, obesity, who came to the hospital after presenting worsening nausea, vomiting and generalized fatigue.  Was found to have drop in her hemoglobin down to 6.7  without melena or hematochezia.  Patient was transfused with adequate response and most recent hemoglobin today was above 10.  Due to her symptoms she underwent an EGD that showed presence of grade D esophagitis with possible fungal components which were biopsied, 1 area was clipped x1 as he had persistent oozing after performing the biopsy.  Also had presence of gastritis and duodenitis, gastric biopsies were performed.  Pathology is pending.  The patient has presented major improvement of her symptoms and has been tolerating diet adequately.  It is likely her presentation is related to persistent reflux and possible esophageal candidiasis.  We will start treatment with fluconazole for now and continue her on PPI twice a day.  She will need a repeat EGD in 3 months and will need follow-up as outpatient.  Will advance diet as tolerated and continue management of nausea/vomiting with Zofran.  - c/w pantoprazole 40 mg BID IV, discharge on same dosage PO for 3 months - Repeat EGD in 3  months - f/u pathology - Fluconazole 200 mg PO for 20 days - Zofran as needed for nausea - Patient will follow up in GI clinic with Dr. Abbey Chatters in 3-4 weeks. - GI service will sign-off, please call us back if you have any more questions.  Maylon Peppers, MD Gastroenterology and Hepatology [facility]

## 2021-08-29 NOTE — Progress Notes (Signed)
PROGRESS NOTE   Courtney Dyer  [id number] DOB: 1965-12-11 DOA: 08/26/2021 PCP: Pcp, No   Chief Complaint  Patient presents with   Hyperglycemia   Level of care: Telemetry  Brief Admission History:  55 y.o. female, with history of DMII, HLD, gout, and more presents to the ED with a chief complaint of generalized weakness.  Patient reports that this has been going on for about a week.  Today she was so weak she could not walk.  Patient normally ambulates with a cane.  She reports that sitting in her chair she felt woozy.  She admits to chronic nausea and vomiting, the last episode of emesis being yesterday.  Emesis is nonbloody.  Patient reports normal appetite.  Patient denies any chest pain, shortness of breath, cough.  She admits to epigastric abdominal pain that feels sharp.  Nothing makes it worse.  It was improved but not relieved with Pepto-Bismol.  Patient has no dysuria, and has noticed no change in urine output.  Patient does report chronic loose stools as well.  Her last bowel movement was day before yesterday, liquid, brown.  Having a bowel movement does make her abdominal pain better.  The nausea, and loose stools have been present on and off for 2 weeks.  The belly pain has been present for 1 week.  The weakness has been progressively worse for an unknown amount of time, but acutely worse today.  The only other complaint patient has is that her glucose was high in the 300s today.  This was postprandial.  Last hemoglobin A1c was 5.9, so 300 would be quite high for her.   Patient quit smoking and quit drinking 1 week ago.  She reports that up until then she was drinking 2 bottles of beer per day.  She has not had any tremors, hallucinations, seizures.  She reports that she does not use illicit drugs.  She is vaccinated for COVID.  Patient is full code.  Assessment & Plan:   Active Problems:   Hypokalemia   Hypomagnesemia   Intractable nausea and vomiting   Hypocalcemia    Generalized weakness   Alcohol abuse   Acute urinary retention   ABLA (acute blood loss anemia)   Gastrointestinal hemorrhage  Intractable nausea and vomiting - improving with supportive measures -As evidenced by severe electrolyte abnormalities seen on BMP -As evidenced by severe clinical dehydration -Appreciate GI consultation and recommendations for upper endoscopy later today -Replete electrolytes aggressively and provide IV fluid hydration  Hypokalemia/hypocalcemia/hyponatremia/hypomagnesemia -Aggressive replacement of electrolytes given -Continue IV fluid hydration -Monitor daily electrolytes studies -REPLETED    Generalized weakness -Likely multifactorial given chronic alcoholism, malnutrition and severe electrolyte abnormalities -Replete IV fluids and electrolytes -PT evaluation requested -Consult dietitian -Vitamin supplements as ordered  Chronic alcohol abuse -Patient reports last alcohol consumption was 1 week ago -Continue CIWA protocol -Continue vitamin supplementation as ordered. -Consulted TOC regarding alcohol treatment options  Leukocytosis -Likely reactive, WBC trending down   Type 2 diabetes mellitus, diet controlled -Follow-up hemoglobin A1c -Monitor CBG CBG (last 3)  Recent Labs    08/29/21 0730 08/29/21 1103 08/29/21 1627  GLUCAP 96 134* 102*    Normocytic anemia -GI consulted for upper endoscopy -Protonix ordered per GI recommendations -Suspect nutritional component --repeat endoscopy in 3 months per GI service   Acute urinary retention  - I/O cath PRN if unable to void - try to avoid foley placement    DVT prophylaxis: SCDs Code Status: Full Family Communication: None present Disposition:  Anticipate home 10/16 Status is: Inpatient  Remains inpatient appropriate because: IV fluid, IV electrolyte replacement and inpatient procedure required   Consultants:  Inpatient GI service   Procedures:  EGD 10/14 per GI    Antimicrobials:  N/a   Subjective: Patient reports tolerating clear liquid diet at this time  Objective: Vitals:   08/28/21 1445 08/28/21 2047 08/29/21 0517 08/29/21 1255  BP: 112/68 117/76 108/76 117/78  Pulse: 98 94 95 94  Resp: 20   17  Temp:  98.3 F (36.8 C)  98.3 F (36.8 C)  TempSrc:  Oral  Oral  SpO2: 100% 100% 99% 100%  Weight:      Height:        Intake/Output Summary (Last 24 hours) at 08/29/2021 1836 Last data filed at 08/29/2021 1629 Gross per 24 hour  Intake 841.43 ml  Output 2100 ml  Net -1258.57 ml   Filed Weights   08/26/21 1343  Weight: 108.9 kg    Examination:  General exam: chronically ill, disheveled appearing female, Appears calm and comfortable  Respiratory system: Clear to auscultation. Respiratory effort normal. Cardiovascular system: normal S1 & S2 heard. No JVD, murmurs, rubs, gallops or clicks. No pedal edema. Gastrointestinal system: Abdomen is nondistended, soft and nontender. No organomegaly or masses felt. Normal bowel sounds heard. Central nervous system: Alert and oriented. No focal neurological deficits. Extremities: Symmetric 5 x 5 power. Skin: No rashes, lesions or ulcers Psychiatry: Judgement and insight appear poor. Mood & affect flat.   Data Reviewed: I have personally reviewed following labs and imaging studies  CBC: Recent Labs  Lab 08/26/21 1503 08/27/21 0610 08/28/21 0637 08/29/21 0447  WBC 15.4* 11.7* 11.9* 13.3*  NEUTROABS 13.5* 9.2* 8.9* 9.9*  HGB 8.5* 6.7* 10.2* 10.4*  HCT 25.6* 21.1* 31.2* 31.6*  MCV 79.8* 81.2 83.4 84.7  PLT 147* 121* 154 [id number]    Basic Metabolic Panel: Recent Labs  Lab 08/26/21 1503 08/27/21 0610 08/28/21 0637 08/29/21 0447  NA 133* 133* 136 135  K 2.4* 2.4* 3.2* 3.8  CL 90* 95* 100 101  CO2 '28 27 28 28  '$ GLUCOSE 206* 107* 120* 82  BUN 21* 20 22* 20  CREATININE 2.11* 1.92* 1.75* 1.72*  CALCIUM 5.2* 5.3* 7.2* 7.6*  MG 0.7* 1.2* 1.9 2.1    GFR: Estimated Creatinine  Clearance: 45.6 mL/min (A) (by C-G formula based on SCr of 1.72 mg/dL (H)).  Liver Function Tests: Recent Labs  Lab 08/26/21 1503 08/27/21 0610 08/28/21 0637 08/29/21 0447  AST 18 12* 9* 11*  ALT '6 7 6 7  '$ ALKPHOS 74 61 61 63  BILITOT 0.8 0.9 0.8 0.7  PROT 6.4* 5.6* 5.7* 5.8*  ALBUMIN 2.1* 1.8* 1.9* 1.8*    CBG: Recent Labs  Lab 08/28/21 1441 08/28/21 2047 08/29/21 0730 08/29/21 1103 08/29/21 1627  GLUCAP 108* 199* 96 134* 102*    Recent Results (from the past 240 hour(s))  Resp Panel by RT-PCR (Flu A&B, Covid) Nasopharyngeal Swab     Status: None   Collection Time: 08/26/21  4:39 PM   Specimen: Nasopharyngeal Swab; Nasopharyngeal(NP) swabs in vial transport medium  Result Value Ref Range Status   SARS Coronavirus 2 by RT PCR NEGATIVE NEGATIVE Final    Comment: (NOTE) SARS-CoV-2 target nucleic acids are NOT DETECTED.  The SARS-CoV-2 RNA is generally detectable in upper respiratory specimens during the acute phase of infection. The lowest concentration of SARS-CoV-2 viral copies this assay can detect is 138 copies/mL. A negative result does not preclude  SARS-Cov-2 infection and should not be used as the sole basis for treatment or other patient management decisions. A negative result may occur with  improper specimen collection/handling, submission of specimen other than nasopharyngeal swab, presence of viral mutation(s) within the areas targeted by this assay, and inadequate number of viral copies(<138 copies/mL). A negative result must be combined with clinical observations, patient history, and epidemiological information. The expected result is Negative.  Fact Sheet for Patients:  EntrepreneurPulse.com.au  Fact Sheet for Healthcare Providers:  IncredibleEmployment.be  This test is no t yet approved or cleared by the [place] FDA and  has been authorized for detection and/or diagnosis of SARS-CoV-2 by FDA under an  Emergency Use Authorization (EUA). This EUA will remain  in effect (meaning this test can be used) for the duration of the COVID-19 declaration under Section 564(b)(1) of the Act, 21 U.S.C.section 360bbb-3(b)(1), unless the authorization is terminated  or revoked sooner.       Influenza A by PCR NEGATIVE NEGATIVE Final   Influenza B by PCR NEGATIVE NEGATIVE Final    Comment: (NOTE) The Xpert Xpress SARS-CoV-2/FLU/RSV plus assay is intended as an aid in the diagnosis of influenza from Nasopharyngeal swab specimens and should not be used as a sole basis for treatment. Nasal washings and aspirates are unacceptable for Xpert Xpress SARS-CoV-2/FLU/RSV testing.  Fact Sheet for Patients: EntrepreneurPulse.com.au  Fact Sheet for Healthcare Providers: IncredibleEmployment.be  This test is not yet approved or cleared by the [place] FDA and has been authorized for detection and/or diagnosis of SARS-CoV-2 by FDA under an Emergency Use Authorization (EUA). This EUA will remain in effect (meaning this test can be used) for the duration of the COVID-19 declaration under Section 564(b)(1) of the Act, 21 U.S.C. section 360bbb-3(b)(1), unless the authorization is terminated or revoked.  Performed at [facility], [address], [place], [place] [address]      Radiology Studies: No results found.  Scheduled Meds:  (feeding supplement) PROSource Plus  30 mL Oral TID BM   calcium carbonate  1 tablet Oral TID WC   feeding supplement  1 Container Oral TID BM   folic acid  1 mg Oral Daily   insulin aspart  0-15 Units Subcutaneous TID WC   insulin aspart  0-5 Units Subcutaneous QHS   LORazepam  0-4 mg Oral Q12H   magnesium oxide  400 mg Oral Daily   multivitamin with minerals  1 tablet Oral Daily   nystatin   Topical TID   pantoprazole (PROTONIX) IV  40 mg Intravenous Q12H   sucralfate  1 g Oral TID WC & HS   thiamine  100 mg Oral Daily   Or    thiamine  100 mg Intravenous Daily   Continuous Infusions:  lactated ringers 50 mL/hr at 08/29/21 0748    LOS: 3 days   Time spent: 35 mins     Xiara Knisley Wynetta Emery, MD How to contact the [facility] Attending or Consulting provider [place] or covering provider during after hours [place], for this patient?  Check the care team in [facility] and look for a) attending/consulting TRH provider listed and b) the [facility] team listed Log into [email or web address] and use [place]'s universal password to access. If you do not have the password, please contact the hospital operator. Locate the [facility] provider you are looking for under [facility] and page to a number that you can be directly reached. If you still have difficulty reaching the provider, please page the  DOC (Director on Call) for the Hospitalists listed on amion for assistance.  08/29/2021, 6:36 PM

## 2021-08-30 DIAGNOSIS — D500 Iron deficiency anemia secondary to blood loss (chronic): Secondary | ICD-10-CM

## 2021-08-30 DIAGNOSIS — R112 Nausea with vomiting, unspecified: Secondary | ICD-10-CM | POA: Diagnosis not present

## 2021-08-30 DIAGNOSIS — K209 Esophagitis, unspecified without bleeding: Secondary | ICD-10-CM

## 2021-08-30 LAB — GLUCOSE, CAPILLARY
Glucose-Capillary: 97 mg/dL (ref 70–99)
Glucose-Capillary: 124 mg/dL — ABNORMAL HIGH (ref 70–99)
Glucose-Capillary: 112 mg/dL — ABNORMAL HIGH (ref 70–99)
Glucose-Capillary: 210 mg/dL — ABNORMAL HIGH (ref 70–99)

## 2021-08-30 NOTE — Progress Notes (Signed)
PROGRESS NOTE   Courtney Dyer  [id number] DOB: Oct 03, 1966 DOA: 08/26/2021 PCP: Pcp, No   Chief Complaint  Patient presents with   Hyperglycemia   Level of care: Telemetry  Brief Admission History:  55 y.o. female, with history of DMII, HLD, gout, and more presents to the ED with a chief complaint of generalized weakness.  Patient reports that this has been going on for about a week.  Today she was so weak she could not walk.  Patient normally ambulates with a cane.  She reports that sitting in her chair she felt woozy.  She admits to chronic nausea and vomiting, the last episode of emesis being yesterday.  Emesis is nonbloody.  Patient reports normal appetite.  Patient denies any chest pain, shortness of breath, cough.  She admits to epigastric abdominal pain that feels sharp.  Nothing makes it worse.  It was improved but not relieved with Pepto-Bismol.  Patient has no dysuria, and has noticed no change in urine output.  Patient does report chronic loose stools as well.  Her last bowel movement was day before yesterday, liquid, brown.  Having a bowel movement does make her abdominal pain better.  The nausea, and loose stools have been present on and off for 2 weeks.  The belly pain has been present for 1 week.  The weakness has been progressively worse for an unknown amount of time, but acutely worse today.  The only other complaint patient has is that her glucose was high in the 300s today.  This was postprandial.  Last hemoglobin A1c was 5.9, so 300 would be quite high for her.   Patient quit smoking and quit drinking 1 week ago.  She reports that up until then she was drinking 2 bottles of beer per day.  She has not had any tremors, hallucinations, seizures.  She reports that she does not use illicit drugs.  She is vaccinated for COVID.  Patient is full code.  Assessment & Plan:   Active Problems:   Hypokalemia   Hypomagnesemia   Intractable nausea and vomiting   Hypocalcemia    Generalized weakness   Alcohol abuse   Acute urinary retention   ABLA (acute blood loss anemia)   Gastrointestinal hemorrhage  Intractable nausea and vomiting - improving with supportive measures -As evidenced by severe electrolyte abnormalities seen on BMP -As evidenced by severe clinical dehydration -Appreciate GI consultation and recommendations for upper endoscopy later today -Replete electrolytes aggressively and provide IV fluid hydration  Hypokalemia/hypocalcemia/hyponatremia/hypomagnesemia -Aggressive replacement of electrolytes given -Continue IV fluid hydration -Monitor daily electrolytes studies -REPLETED    Generalized weakness -Likely multifactorial given chronic alcoholism, malnutrition and severe electrolyte abnormalities -Replete IV fluids and electrolytes -PT evaluation requested -Consult dietitian -Vitamin supplements as ordered  Chronic alcohol abuse -Patient reports last alcohol consumption was 1 week ago -Continue CIWA protocol -Continue vitamin supplementation as ordered. -Consulted TOC regarding alcohol treatment options  Leukocytosis -Likely reactive, WBC trending down   Type 2 diabetes mellitus, diet controlled -Follow-up hemoglobin A1c: 6.3%  -Monitor CBG CBG (last 3)  Recent Labs    08/29/21 2053 08/30/21 0731 08/30/21 1110  GLUCAP 97 97 124*    Normocytic anemia -GI consulted for upper endoscopy -Protonix ordered per GI recommendations -Suspect nutritional component --repeat endoscopy in 3 months per GI service   Acute urinary retention  - I/O cath PRN if unable to void - try to avoid foley placement    DVT prophylaxis: SCDs Code Status: Full Family Communication: None  present Disposition: Anticipate DC SNF 10/17 Status is: Inpatient  Remains inpatient appropriate because: IV fluid, IV electrolyte replacement and inpatient procedure required   Consultants:  Inpatient GI service   Procedures:  EGD 10/14 per GI    Antimicrobials:  N/a   Subjective: Patient without specific complaints  Objective: Vitals:   08/29/21 0517 08/29/21 1255 08/29/21 2055 08/30/21 0505  BP: 108/76 117/78 123/81 133/86  Pulse: 95 94 97 98  Resp:  17  18  Temp:  98.3 F (36.8 C) 98.6 F (37 C) 98 F (36.7 C)  TempSrc:  Oral Oral   SpO2: 99% 100% 99% 100%  Weight:      Height:        Intake/Output Summary (Last 24 hours) at 08/30/2021 1448 Last data filed at 08/30/2021 1300 Gross per 24 hour  Intake 240 ml  Output 1900 ml  Net -1660 ml   Filed Weights   08/26/21 1343  Weight: 108.9 kg    Examination:  General exam: chronically ill, disheveled appearing female, Appears calm and comfortable  Respiratory system: Clear to auscultation. Respiratory effort normal. Cardiovascular system: normal S1 & S2 heard. No JVD, murmurs, rubs, gallops or clicks. No pedal edema. Gastrointestinal system: Abdomen is nondistended, soft and nontender. No organomegaly or masses felt. Normal bowel sounds heard. Central nervous system: Alert and oriented. No focal neurological deficits. Extremities: Symmetric 5 x 5 power. Skin: No rashes, lesions or ulcers Psychiatry: Judgement and insight appear poor. Mood & affect flat.   Data Reviewed: I have personally reviewed following labs and imaging studies  CBC: Recent Labs  Lab 08/26/21 1503 08/27/21 0610 08/28/21 0637 08/29/21 0447  WBC 15.4* 11.7* 11.9* 13.3*  NEUTROABS 13.5* 9.2* 8.9* 9.9*  HGB 8.5* 6.7* 10.2* 10.4*  HCT 25.6* 21.1* 31.2* 31.6*  MCV 79.8* 81.2 83.4 84.7  PLT 147* 121* 154 [id number]    Basic Metabolic Panel: Recent Labs  Lab 08/26/21 1503 08/27/21 0610 08/28/21 0637 08/29/21 0447  NA 133* 133* 136 135  K 2.4* 2.4* 3.2* 3.8  CL 90* 95* 100 101  CO2 '28 27 28 28  '$ GLUCOSE 206* 107* 120* 82  BUN 21* 20 22* 20  CREATININE 2.11* 1.92* 1.75* 1.72*  CALCIUM 5.2* 5.3* 7.2* 7.6*  MG 0.7* 1.2* 1.9 2.1    GFR: Estimated Creatinine Clearance: 45.6 mL/min  (A) (by C-G formula based on SCr of 1.72 mg/dL (H)).  Liver Function Tests: Recent Labs  Lab 08/26/21 1503 08/27/21 0610 08/28/21 0637 08/29/21 0447  AST 18 12* 9* 11*  ALT '6 7 6 7  '$ ALKPHOS 74 61 61 63  BILITOT 0.8 0.9 0.8 0.7  PROT 6.4* 5.6* 5.7* 5.8*  ALBUMIN 2.1* 1.8* 1.9* 1.8*    CBG: Recent Labs  Lab 08/29/21 1103 08/29/21 1627 08/29/21 2053 08/30/21 0731 08/30/21 1110  GLUCAP 134* 102* 97 97 124*    Recent Results (from the past 240 hour(s))  Resp Panel by RT-PCR (Flu A&B, Covid) Nasopharyngeal Swab     Status: None   Collection Time: 08/26/21  4:39 PM   Specimen: Nasopharyngeal Swab; Nasopharyngeal(NP) swabs in vial transport medium  Result Value Ref Range Status   SARS Coronavirus 2 by RT PCR NEGATIVE NEGATIVE Final    Comment: (NOTE) SARS-CoV-2 target nucleic acids are NOT DETECTED.  The SARS-CoV-2 RNA is generally detectable in upper respiratory specimens during the acute phase of infection. The lowest concentration of SARS-CoV-2 viral copies this assay can detect is 138 copies/mL. A negative result does not  preclude SARS-Cov-2 infection and should not be used as the sole basis for treatment or other patient management decisions. A negative result may occur with  improper specimen collection/handling, submission of specimen other than nasopharyngeal swab, presence of viral mutation(s) within the areas targeted by this assay, and inadequate number of viral copies(<138 copies/mL). A negative result must be combined with clinical observations, patient history, and epidemiological information. The expected result is Negative.  Fact Sheet for Patients:  EntrepreneurPulse.com.au  Fact Sheet for Healthcare Providers:  IncredibleEmployment.be  This test is no t yet approved or cleared by the [place] FDA and  has been authorized for detection and/or diagnosis of SARS-CoV-2 by FDA under an Emergency Use Authorization  (EUA). This EUA will remain  in effect (meaning this test can be used) for the duration of the COVID-19 declaration under Section 564(b)(1) of the Act, 21 U.S.C.section 360bbb-3(b)(1), unless the authorization is terminated  or revoked sooner.       Influenza A by PCR NEGATIVE NEGATIVE Final   Influenza B by PCR NEGATIVE NEGATIVE Final    Comment: (NOTE) The Xpert Xpress SARS-CoV-2/FLU/RSV plus assay is intended as an aid in the diagnosis of influenza from Nasopharyngeal swab specimens and should not be used as a sole basis for treatment. Nasal washings and aspirates are unacceptable for Xpert Xpress SARS-CoV-2/FLU/RSV testing.  Fact Sheet for Patients: EntrepreneurPulse.com.au  Fact Sheet for Healthcare Providers: IncredibleEmployment.be  This test is not yet approved or cleared by the [place] FDA and has been authorized for detection and/or diagnosis of SARS-CoV-2 by FDA under an Emergency Use Authorization (EUA). This EUA will remain in effect (meaning this test can be used) for the duration of the COVID-19 declaration under Section 564(b)(1) of the Act, 21 U.S.C. section 360bbb-3(b)(1), unless the authorization is terminated or revoked.  Performed at [facility], [address] [place], [place] [address]      Radiology Studies: No results found.  Scheduled Meds:  (feeding supplement) PROSource Plus  30 mL Oral TID BM   calcium carbonate  1 tablet Oral TID WC   feeding supplement  1 Container Oral TID BM   folic acid  1 mg Oral Daily   insulin aspart  0-15 Units Subcutaneous TID WC   insulin aspart  0-5 Units Subcutaneous QHS   LORazepam  0-4 mg Oral Q12H   magnesium oxide  400 mg Oral Daily   multivitamin with minerals  1 tablet Oral Daily   nystatin   Topical TID   pantoprazole (PROTONIX) IV  40 mg Intravenous Q12H   sucralfate  1 g Oral TID WC & HS   thiamine  100 mg Oral Daily   Or   thiamine  100 mg Intravenous  Daily   Continuous Infusions:  lactated ringers 50 mL/hr at 08/29/21 0748    LOS: 4 days   Time spent: 35 mins     Rajean Desantiago Wynetta Emery, MD How to contact the [facility] Attending or Consulting provider [place] or covering provider during after hours [place], for this patient?  Check the care team in [facility] and look for a) attending/consulting TRH provider listed and b) the [facility] team listed Log into [email or web address] and use [place]'s universal password to access. If you do not have the password, please contact the hospital operator. Locate the [facility] provider you are looking for under [facility] and page to a number that you can be directly reached. If you still have difficulty reaching the provider, please page  the [facility] (Director on Call) for the Hospitalists listed on amion for assistance.  08/30/2021, 2:48 PM

## 2021-08-30 NOTE — TOC Progression Note (Signed)
Transition of Care [facility]) - Progression Note    Patient Details  Name: Courtney Dyer MRN: [id number] Date of Birth: Oct 23, 1966  Transition of Care [facility]) CM/SW Contact  Natasha Bence, LCSW Phone Number: 08/30/2021, 1:57 PM  Clinical Narrative:    Jackelyn Poling with [facility] reported that they could make a bed offer, but would not be able to take patient today due to their system being down. Patient's aunt reported that she would prefer for patient to stay an [place] for SNF. TOC to follow.   Expected Discharge Plan: [place] Barriers to Discharge: Continued Medical Work up  Expected Discharge Plan and Services Expected Discharge Plan: [place] In-house Referral: Clinical Social Work     Living arrangements for the past 2 months: Single Family Home                 DME Arranged: N/A                     Social Determinants of Health (SDOH) Interventions    Readmission Risk Interventions Readmission Risk Prevention Plan 08/27/2021 04/27/2021  Medication Screening - Complete  Transportation Screening Complete Complete  Home Care Screening Complete -  Medication Review (RN CM) Complete -  Some recent data might be hidden

## 2021-08-31 ENCOUNTER — Encounter (HOSPITAL_COMMUNITY): Payer: Self-pay | Admitting: Family Medicine

## 2021-08-31 ENCOUNTER — Encounter: Payer: Self-pay | Admitting: Internal Medicine

## 2021-08-31 DIAGNOSIS — K209 Esophagitis, unspecified without bleeding: Secondary | ICD-10-CM | POA: Diagnosis not present

## 2021-08-31 DIAGNOSIS — R112 Nausea with vomiting, unspecified: Secondary | ICD-10-CM | POA: Diagnosis not present

## 2021-08-31 DIAGNOSIS — D500 Iron deficiency anemia secondary to blood loss (chronic): Secondary | ICD-10-CM | POA: Diagnosis not present

## 2021-08-31 LAB — BASIC METABOLIC PANEL
Anion gap: 5 (ref 5–15)
BUN: 21 mg/dL — ABNORMAL HIGH (ref 6–20)
CO2: 28 mmol/L (ref 22–32)
Calcium: 7.7 mg/dL — ABNORMAL LOW (ref 8.9–10.3)
Chloride: 103 mmol/L (ref 98–111)
Creatinine, Ser: 1.8 mg/dL — ABNORMAL HIGH (ref 0.44–1.00)
GFR, Estimated: 33 mL/min — ABNORMAL LOW (ref >60)
Glucose, Bld: 190 mg/dL — ABNORMAL HIGH (ref 70–99)
Potassium: 3.8 mmol/L (ref 3.5–5.1)
Sodium: 136 mmol/L (ref 135–145)

## 2021-08-31 LAB — GLUCOSE, CAPILLARY
Glucose-Capillary: 103 mg/dL — ABNORMAL HIGH (ref 70–99)
Glucose-Capillary: 167 mg/dL — ABNORMAL HIGH (ref 70–99)
Glucose-Capillary: 120 mg/dL — ABNORMAL HIGH (ref 70–99)
Glucose-Capillary: 128 mg/dL — ABNORMAL HIGH (ref 70–99)

## 2021-08-31 LAB — H. PYLORI ANTIBODY, IGG: H Pylori IgG: 0.18 Index Value (ref 0.00–0.79)

## 2021-08-31 LAB — MAGNESIUM: Magnesium: 1.1 mg/dL — ABNORMAL LOW (ref 1.7–2.4)

## 2021-08-31 MED ORDER — PANTOPRAZOLE SODIUM 40 MG PO TBEC
40.0000 mg | DELAYED_RELEASE_TABLET | Freq: Two times a day (BID) | ORAL | Status: DC
  Administered 2021-08-31 – 2021-09-01 (×2): 40 mg via ORAL
  Filled 2021-08-31 (×2): qty 1

## 2021-08-31 MED ORDER — CHLORDIAZEPOXIDE HCL 5 MG PO CAPS
25.0000 mg | ORAL_CAPSULE | Freq: Three times a day (TID) | ORAL | Status: DC
  Administered 2021-08-31: 25 mg via ORAL
  Filled 2021-08-31: qty 5

## 2021-08-31 MED ORDER — CHLORDIAZEPOXIDE HCL 5 MG PO CAPS
10.0000 mg | ORAL_CAPSULE | Freq: Three times a day (TID) | ORAL | Status: DC
  Administered 2021-08-31 – 2021-09-01 (×2): 10 mg via ORAL
  Filled 2021-08-31 (×2): qty 2

## 2021-08-31 MED ORDER — LACTATED RINGERS IV BOLUS
1000.0000 mL | Freq: Once | INTRAVENOUS | Status: AC
  Administered 2021-08-31: 1000 mL via INTRAVENOUS

## 2021-08-31 MED ORDER — LACTATED RINGERS IV BOLUS: 500.0000 mL | Freq: Once | INTRAVENOUS | Status: DC

## 2021-08-31 NOTE — Evaluation (Signed)
Clinical/Bedside Swallow Evaluation Patient Details  Name: Courtney Dyer MRN: [id number] Date of Birth: Mar 20, 1969  Today's Date: 08/31/2021 Time: SLP Start Time (ACUTE ONLY): 53 SLP Stop Time (ACUTE ONLY): 1410 SLP Time Calculation (min) (ACUTE ONLY): 25 min  Past Medical History:  Past Medical History:  Diagnosis Date   Diabetes mellitus    Gout    High cholesterol    Past Surgical History:  Past Surgical History:  Procedure Laterality Date   BREAST SURGERY     NEPHRECTOMY     HPI:  55 year old female with past medical history of diabetes, gout, hyperlipidemia, obesity, who came to the hospital after presenting worsening nausea, vomiting and generalized fatigue.  Was found to have drop in her hemoglobin down to 6.7 without melena or hematochezia.  Patient was transfused with adequate response and most recent hemoglobin today was above 10.  Due to her symptoms she underwent an EGD that showed presence of grade D esophagitis with possible fungal components which were biopsied, 1 area was clipped x1 as he had persistent oozing after performing the biopsy.  Also had presence of gastritis and duodenitis, gastric biopsies were performed.  Pathology is pending.  The patient has presented major improvement of her symptoms and has been tolerating diet adequately.    Assessment / Plan / Recommendation  Clinical Impression  Clinical swallow evaluation completed at bedside. Pt had EGD a few days ago (see results) and is on full liquid diet. Her nurse reported that Pt had difficulty swallowing her pills with water this AM, but did well when presented in applesauce. Oral motor examination is WNL. Pt with poor trunk support and required cues for sitting upright in bed. Pt without overt signs or symptoms of aspiration. SLP reviewed reflux precautions given EGD findings. Recommend regular textures and thin liquids (MD to advance, GI signed off) with reflux precautions and break large pills as able  or present in puree. No further SLP services indicated at this time. SLP Visit Diagnosis: Dysphagia, unspecified (R13.10)    Aspiration Risk  No limitations    Diet Recommendation Regular;Thin liquid   Liquid Administration via: Cup;Straw Medication Administration: Whole meds with liquid (or in puree if large pills) Supervision: Patient able to self feed Postural Changes: Seated upright at 90 degrees;Remain upright for at least 30 minutes after po intake    Other  Recommendations Oral Care Recommendations: Oral care BID Other Recommendations: Clarify dietary restrictions    Recommendations for follow up therapy are one component of a multi-disciplinary discharge planning process, led by the attending physician.  Recommendations may be updated based on patient status, additional functional criteria and insurance authorization.  Follow up Recommendations None      Frequency and Duration            Prognosis Prognosis for Safe Diet Advancement: Good      Swallow Study   General Date of Onset: 08/26/21 HPI: 55 year old female with past medical history of diabetes, gout, hyperlipidemia, obesity, who came to the hospital after presenting worsening nausea, vomiting and generalized fatigue.  Was found to have drop in her hemoglobin down to 6.7 without melena or hematochezia.  Patient was transfused with adequate response and most recent hemoglobin today was above 10.  Due to her symptoms she underwent an EGD that showed presence of grade D esophagitis with possible fungal components which were biopsied, 1 area was clipped x1 as he had persistent oozing after performing the biopsy.  Also had presence of gastritis and duodenitis,  gastric biopsies were performed.  Pathology is pending.  The patient has presented major improvement of her symptoms and has been tolerating diet adequately. Type of Study: Bedside Swallow Evaluation Diet Prior to this Study:  (full liquids) Temperature Spikes  Noted: No Respiratory Status: Room air History of Recent Intubation: No Behavior/Cognition: Alert;Cooperative;Pleasant mood Oral Cavity Assessment: Within Functional Limits Oral Care Completed by SLP: Yes Oral Cavity - Dentition: Adequate natural dentition Vision: Functional for self-feeding Self-Feeding Abilities: Able to feed self Patient Positioning: Upright in bed Baseline Vocal Quality: Normal;Low vocal intensity Volitional Cough: Strong Volitional Swallow: Able to elicit    Oral/Motor/Sensory Function Overall Oral Motor/Sensory Function: Within functional limits   Ice Chips Ice chips: Within functional limits Presentation: Spoon   Thin Liquid Thin Liquid: Within functional limits Presentation: Cup;Self Fed;Straw    Nectar Thick Nectar Thick Liquid: Not tested   Honey Thick Honey Thick Liquid: Not tested   Puree Puree: Within functional limits Presentation: Self Fed;Spoon   Solid     Solid: Within functional limits Presentation: Self Fed     Thank you,  Genene Churn, [place]  Nikia Levels 08/31/2021,2:35 PM

## 2021-08-31 NOTE — Progress Notes (Signed)
Offered to ambulate patient to chair x 3 today patient refused. Flat affect.

## 2021-08-31 NOTE — Progress Notes (Signed)
PROGRESS NOTE   Courtney Dyer  [id number] DOB: [birth year] DOA: 08/26/2021 PCP: Pcp, No   Chief Complaint  Patient presents with   Hyperglycemia   Level of care: Telemetry  Brief Admission History:  55 y.o. female, with history of DMII, HLD, gout, and more presents to the ED with a chief complaint of generalized weakness.  Patient reports that this has been going on for about a week.  Today she was so weak she could not walk.  Patient normally ambulates with a cane.  She reports that sitting in her chair she felt woozy.  She admits to chronic nausea and vomiting, the last episode of emesis being yesterday.  Emesis is nonbloody.  Patient reports normal appetite.  Patient denies any chest pain, shortness of breath, cough.  She admits to epigastric abdominal pain that feels sharp.  Nothing makes it worse.  It was improved but not relieved with Pepto-Bismol.  Patient has no dysuria, and has noticed no change in urine output.  Patient does report chronic loose stools as well.  Her last bowel movement was day before yesterday, liquid, brown.  Having a bowel movement does make her abdominal pain better.  The nausea, and loose stools have been present on and off for 2 weeks.  The belly pain has been present for 1 week.  The weakness has been progressively worse for an unknown amount of time, but acutely worse today.  The only other complaint patient has is that her glucose was high in the 300s today.  This was postprandial.  Last hemoglobin A1c was 5.9, so 300 would be quite high for her.   Patient quit smoking and quit drinking 1 week ago.  She reports that up until then she was drinking 2 bottles of beer per day.  She has not had any tremors, hallucinations, seizures.  She reports that she does not use illicit drugs.  She is vaccinated for COVID.  Patient is full code.  Assessment & Plan:   Active Problems:   Hypokalemia   Hypomagnesemia   Intractable nausea and vomiting   Hypocalcemia    Generalized weakness   Alcohol abuse   Acute urinary retention   ABLA (acute blood loss anemia)   Gastrointestinal hemorrhage  Intractable nausea and vomiting - improving with supportive measures -As evidenced by severe electrolyte abnormalities seen on BMP -As evidenced by severe clinical dehydration -Appreciate GI consultation and recommendations for upper endoscopy later today -Replete electrolytes aggressively and provide IV fluid hydration  Hypokalemia/hypocalcemia/hyponatremia/hypomagnesemia -Aggressive replacement of electrolytes given -Continue IV fluid hydration -Monitor daily electrolytes studies -REPLETED    Generalized weakness -Likely multifactorial given chronic alcoholism, malnutrition and severe electrolyte abnormalities -Replete IV fluids and electrolytes -PT evaluation requested -Consult dietitian -Vitamin supplements as ordered  Chronic alcohol abuse -Patient reports last alcohol consumption was 1 week ago -Continue CIWA protocol -Continue vitamin supplementation as ordered. -Consulted TOC regarding alcohol treatment options --added librium 25 mg TID   Sinus Tachycardia - seen on EKG and telemetry monitor - possibly related to alcohol withdrawal - bolus LR 1L today - added librium for alcohol withdrawal symptoms   Leukocytosis -Likely reactive, WBC trending down   Type 2 diabetes mellitus, diet controlled -Follow-up hemoglobin A1c: 6.3%  -Monitor CBG CBG (last 3)  Recent Labs    08/30/21 2109 08/31/21 0728 08/31/21 1122  GLUCAP 210* 103* 167*    Normocytic anemia -GI consulted for upper endoscopy -Protonix ordered per GI recommendations -Suspect nutritional component --repeat endoscopy in 3 months  per GI service   Acute urinary retention  - I/O cath PRN if unable to void - try to avoid foley placement    DVT prophylaxis: SCDs Code Status: Full Family Communication: None present Disposition: Anticipate DC SNF when bed is available    Status is: Inpatient  Remains inpatient appropriate because: IV fluid, IV electrolyte replacement and inpatient procedure required   Consultants:  Inpatient GI service   Procedures:  EGD 10/14 per GI   Antimicrobials:  N/a   Subjective: Patient without complaints.  She says that she is considering SNF.   Objective: Vitals:   08/30/21 1646 08/30/21 2113 08/31/21 0508 08/31/21 0907  BP: 116/78 110/66 104/67 104/60  Pulse: 100 (!) 109 (!) 114 (!) 121  Resp: '20 20 19 18  '$ Temp: 98.9 F (37.2 C) 98.8 F (37.1 C) 98.7 F (37.1 C)   TempSrc: Oral Oral Oral   SpO2: 100% 100% 100% 99%  Weight:      Height:        Intake/Output Summary (Last 24 hours) at 08/31/2021 1132 Last data filed at 08/31/2021 1125 Gross per 24 hour  Intake 592 ml  Output 2700 ml  Net -2108 ml   Filed Weights   08/26/21 1343  Weight: 108.9 kg    Examination:  General exam: chronically ill, disheveled appearing female, Appears calm and comfortable  Respiratory system: Clear to auscultation. Respiratory effort normal. Cardiovascular system: normal S1 & S2 heard. No JVD, murmurs, rubs, gallops or clicks. No pedal edema. Gastrointestinal system: Abdomen is nondistended, soft and nontender. No organomegaly or masses felt. Normal bowel sounds heard. Central nervous system: Alert and oriented. No focal neurological deficits. Extremities: Symmetric 5 x 5 power. Skin: No rashes, lesions or ulcers Psychiatry: Judgement and insight appear poor. Mood & affect flat.   Data Reviewed: I have personally reviewed following labs and imaging studies  CBC: Recent Labs  Lab 08/26/21 1503 08/27/21 0610 08/28/21 0637 08/29/21 0447  WBC 15.4* 11.7* 11.9* 13.3*  NEUTROABS 13.5* 9.2* 8.9* 9.9*  HGB 8.5* 6.7* 10.2* 10.4*  HCT 25.6* 21.1* 31.2* 31.6*  MCV 79.8* 81.2 83.4 84.7  PLT 147* 121* 154 [id number]    Basic Metabolic Panel: Recent Labs  Lab 08/26/21 1503 08/27/21 0610 08/28/21 0637 08/29/21 0447  08/31/21 0950  NA 133* 133* 136 135 136  K 2.4* 2.4* 3.2* 3.8 3.8  CL 90* 95* 100 101 103  CO2 '28 27 28 28 28  '$ GLUCOSE 206* 107* 120* 82 190*  BUN 21* 20 22* 20 21*  CREATININE 2.11* 1.92* 1.75* 1.72* 1.80*  CALCIUM 5.2* 5.3* 7.2* 7.6* 7.7*  MG 0.7* 1.2* 1.9 2.1 1.1*    GFR: Estimated Creatinine Clearance: 43.6 mL/min (A) (by C-G formula based on SCr of 1.8 mg/dL (H)).  Liver Function Tests: Recent Labs  Lab 08/26/21 1503 08/27/21 0610 08/28/21 0637 08/29/21 0447  AST 18 12* 9* 11*  ALT '6 7 6 7  '$ ALKPHOS 74 61 61 63  BILITOT 0.8 0.9 0.8 0.7  PROT 6.4* 5.6* 5.7* 5.8*  ALBUMIN 2.1* 1.8* 1.9* 1.8*    CBG: Recent Labs  Lab 08/30/21 1110 08/30/21 1649 08/30/21 2109 08/31/21 0728 08/31/21 1122  GLUCAP 124* 112* 210* 103* 167*    Recent Results (from the past 240 hour(s))  Resp Panel by RT-PCR (Flu A&B, Covid) Nasopharyngeal Swab     Status: None   Collection Time: 08/26/21  4:39 PM   Specimen: Nasopharyngeal Swab; Nasopharyngeal(NP) swabs in vial transport medium  Result Value  Ref Range Status   SARS Coronavirus 2 by RT PCR NEGATIVE NEGATIVE Final    Comment: (NOTE) SARS-CoV-2 target nucleic acids are NOT DETECTED.  The SARS-CoV-2 RNA is generally detectable in upper respiratory specimens during the acute phase of infection. The lowest concentration of SARS-CoV-2 viral copies this assay can detect is 138 copies/mL. A negative result does not preclude SARS-Cov-2 infection and should not be used as the sole basis for treatment or other patient management decisions. A negative result may occur with  improper specimen collection/handling, submission of specimen other than nasopharyngeal swab, presence of viral mutation(s) within the areas targeted by this assay, and inadequate number of viral copies(<138 copies/mL). A negative result must be combined with clinical observations, patient history, and epidemiological information. The expected result is  Negative.  Fact Sheet for Patients:  EntrepreneurPulse.com.au  Fact Sheet for Healthcare Providers:  IncredibleEmployment.be  This test is no t yet approved or cleared by the [place] FDA and  has been authorized for detection and/or diagnosis of SARS-CoV-2 by FDA under an Emergency Use Authorization (EUA). This EUA will remain  in effect (meaning this test can be used) for the duration of the COVID-19 declaration under Section 564(b)(1) of the Act, 21 U.S.C.section 360bbb-3(b)(1), unless the authorization is terminated  or revoked sooner.       Influenza A by PCR NEGATIVE NEGATIVE Final   Influenza B by PCR NEGATIVE NEGATIVE Final    Comment: (NOTE) The Xpert Xpress SARS-CoV-2/FLU/RSV plus assay is intended as an aid in the diagnosis of influenza from Nasopharyngeal swab specimens and should not be used as a sole basis for treatment. Nasal washings and aspirates are unacceptable for Xpert Xpress SARS-CoV-2/FLU/RSV testing.  Fact Sheet for Patients: EntrepreneurPulse.com.au  Fact Sheet for Healthcare Providers: IncredibleEmployment.be  This test is not yet approved or cleared by the [place] FDA and has been authorized for detection and/or diagnosis of SARS-CoV-2 by FDA under an Emergency Use Authorization (EUA). This EUA will remain in effect (meaning this test can be used) for the duration of the COVID-19 declaration under Section 564(b)(1) of the Act, 21 U.S.C. section 360bbb-3(b)(1), unless the authorization is terminated or revoked.  Performed at [facility], [address], [place], [place] [address]      Radiology Studies: No results found.  Scheduled Meds:  (feeding supplement) PROSource Plus  30 mL Oral TID BM   calcium carbonate  1 tablet Oral TID WC   chlordiazePOXIDE  25 mg Oral TID   feeding supplement  1 Container Oral TID BM   folic acid  1 mg Oral Daily   insulin  aspart  0-15 Units Subcutaneous TID WC   insulin aspart  0-5 Units Subcutaneous QHS   magnesium oxide  400 mg Oral Daily   multivitamin with minerals  1 tablet Oral Daily   nystatin   Topical TID   pantoprazole (PROTONIX) IV  40 mg Intravenous Q12H   sucralfate  1 g Oral TID WC & HS   thiamine  100 mg Oral Daily   Or   thiamine  100 mg Intravenous Daily   Continuous Infusions:  lactated ringers     lactated ringers 35 mL/hr at 08/30/21 1538    LOS: 5 days   Time spent: 35 mins     Marylynn Rigdon Wynetta Emery, MD How to contact the [facility] Attending or Consulting provider [place] or covering provider during after hours [place], for this patient?  Check the care team in [facility] and  look for a) attending/consulting [facility] provider listed and b) the [facility] team listed Log into [email or web address] and use [place] universal password to access. If you do not have the password, please contact the hospital operator. Locate the [facility] provider you are looking for under [facility] and page to a number that you can be directly reached. If you still have difficulty reaching the provider, please page the [facility] (Director on Call) for the Hospitalists listed on amion for assistance.  08/31/2021, 11:32 AM

## 2021-09-01 DIAGNOSIS — R338 Other retention of urine: Secondary | ICD-10-CM | POA: Diagnosis not present

## 2021-09-01 DIAGNOSIS — E876 Hypokalemia: Secondary | ICD-10-CM | POA: Diagnosis not present

## 2021-09-01 DIAGNOSIS — L8990 Pressure ulcer of unspecified site, unspecified stage: Secondary | ICD-10-CM | POA: Insufficient documentation

## 2021-09-01 DIAGNOSIS — F1010 Alcohol abuse, uncomplicated: Secondary | ICD-10-CM | POA: Diagnosis not present

## 2021-09-01 LAB — GLUCOSE, CAPILLARY
Glucose-Capillary: 116 mg/dL — ABNORMAL HIGH (ref 70–99)
Glucose-Capillary: 194 mg/dL — ABNORMAL HIGH (ref 70–99)
Glucose-Capillary: 131 mg/dL — ABNORMAL HIGH (ref 70–99)

## 2021-09-01 MED ORDER — CALCIUM CARBONATE ANTACID 500 MG PO CHEW: 1.0000 | CHEWABLE_TABLET | Freq: Two times a day (BID) | ORAL | 0 refills | Status: AC

## 2021-09-01 MED ORDER — ADULT MULTIVITAMIN W/MINERALS CH: 1.0000 | ORAL_TABLET | Freq: Every day | ORAL | Status: DC

## 2021-09-01 MED ORDER — SUCRALFATE 1 G PO TABS: 1.0000 g | ORAL_TABLET | Freq: Three times a day (TID) | ORAL | 0 refills | Status: DC

## 2021-09-01 MED ORDER — FLUCONAZOLE 200 MG PO TABS: 200.0000 mg | ORAL_TABLET | Freq: Every day | ORAL | 0 refills | Status: AC

## 2021-09-01 MED ORDER — ONDANSETRON HCL 4 MG PO TABS: 4.0000 mg | ORAL_TABLET | Freq: Four times a day (QID) | ORAL | 0 refills | Status: DC | PRN

## 2021-09-01 MED ORDER — PROSOURCE PLUS PO LIQD: 30.0000 mL | Freq: Three times a day (TID) | ORAL | Status: DC

## 2021-09-01 MED ORDER — PANTOPRAZOLE SODIUM 40 MG PO TBEC: 40.0000 mg | DELAYED_RELEASE_TABLET | Freq: Two times a day (BID) | ORAL | 2 refills | Status: DC

## 2021-09-01 MED ORDER — NYSTATIN 100000 UNIT/GM EX POWD: Freq: Three times a day (TID) | CUTANEOUS | 0 refills | Status: DC

## 2021-09-01 MED ORDER — THIAMINE HCL 100 MG PO TABS: 100.0000 mg | ORAL_TABLET | Freq: Every day | ORAL | Status: DC

## 2021-09-01 NOTE — Progress Notes (Signed)
Physical Therapy Treatment Patient Details Name: Courtney Dyer MRN: [id number] DOB: Feb 13, 1966 Today's Date: 09/01/2021   History of Present Illness Courtney Dyer  is a 55 y.o. female presents to the ED with a chief complaint of generalized weakness. She admits to epigastric abdominal pain that feels sharp, denies chest pain and SOB and cough. Had EGD 10/14. PMH: DMII, HLD, gout    PT Comments    Pt able to take 3 slow, shuffling steps, trunk flexed forward, very unsteady with wide BOS using RW and assist from therapist. Pt requires significant assistance with transfers from low seated recliner and toilet, heavy dependence on UEs, slow to power up and maintains trunk flexed in standing. Pt fatigues with transfers, requires A with pericare due to balance. Pt's HR 120-155 noted during session, down to 128 once seated in chair, without complaints. Continue to progress as able.   Recommendations for follow up therapy are one component of a multi-disciplinary discharge planning process, led by the attending physician.  Recommendations may be updated based on patient status, additional functional criteria and insurance authorization.  Follow Up Recommendations  SNF     Equipment Recommendations  None recommended by PT    Recommendations for Other Services       Precautions / Restrictions Precautions Precautions: Fall Restrictions Weight Bearing Restrictions: No     Mobility  Bed Mobility  General bed mobility comments: in recliner    Transfers Overall transfer level: Needs assistance Equipment used: Rolling walker (2 wheeled) Transfers: Sit to/from Stand Sit to Stand: Max assist Stand pivot transfers: Mod assist  General transfer comment: max A to power to stand from recliner with RW and from toilet with grab bar, heavy dependence on UE pushing to stand with significant assist to power up, mod A to pivot toilet<>recliner  Ambulation/Gait Ambulation/Gait assistance: Mod  assist Gait Distance (Feet): 2 Feet Assistive device: Rolling walker (2 wheeled) Gait Pattern/deviations: Shuffle;Wide base of support;Trunk flexed Gait velocity: decreased   General Gait Details: pt takes 3 wide, unsteady, shuffling, steps with RW, fatigues quickly and requests to return to sitting, maintains trunk flexed over RW   Stairs             Wheelchair Mobility    Modified Rankin (Stroke Patients Only)       Balance Overall balance assessment: Needs assistance  Standing balance support: During functional activity;Bilateral upper extremity supported Standing balance-Leahy Scale: Poor Standing balance comment: dependent on RW     Cognition Arousal/Alertness: Awake/alert Behavior During Therapy: WFL for tasks assessed/performed Overall Cognitive Status: Within Functional Limits for tasks assessed     Exercises      General Comments General comments (skin integrity, edema, etc.): HR 120-155 during session, down to 128 once resting in recliner at EOS.      Pertinent Vitals/Pain Pain Assessment: No/denies pain    Home Living                      Prior Function            PT Goals (current goals can now be found in the care plan section) Acute Rehab PT Goals Patient Stated Goal: return home PT Goal Formulation: With patient Time For Goal Achievement: 09/11/21 Potential to Achieve Goals: Fair Progress towards PT goals: Progressing toward goals    Frequency    Min 3X/week      PT Plan Current plan remains appropriate    Co-evaluation  AM-PAC PT "6 Clicks" Mobility   Outcome Measure  Help needed turning from your back to your side while in a flat bed without using bedrails?: None Help needed moving from lying on your back to sitting on the side of a flat bed without using bedrails?: A Little Help needed moving to and from a bed to a chair (including a wheelchair)?: A Lot Help needed standing up from a chair using  your arms (e.g., wheelchair or bedside chair)?: A Lot Help needed to walk in hospital room?: A Lot Help needed climbing 3-5 steps with a railing? : Total 6 Click Score: 14    End of Session Equipment Utilized During Treatment: Gait belt Activity Tolerance: Patient limited by fatigue Patient left: in chair;with call bell/phone within reach;with chair alarm set Nurse Communication: Mobility status PT Visit Diagnosis: Unsteadiness on feet (R26.81);Other abnormalities of gait and mobility (R26.89);Muscle weakness (generalized) (M62.81)     Time: [id number] PT Time Calculation (min) (ACUTE ONLY): 22 min  Charges:  $Therapeutic Activity: 8-22 mins                      Tori Trayon Krantz PT, DPT 09/01/21, 10:32 AM

## 2021-09-01 NOTE — Discharge Summary (Signed)
Physician Discharge Summary  STEPHANA HADAD [id number] DOB: 10-Sep-1966 DOA: 08/26/2021  Admit date: 08/26/2021 Discharge date: 09/01/2021  Admitted From:  Home  Disposition:  SNF ([facility])  Recommendations for Outpatient Follow-up:  Establish care with PCP in 2 weeks Follow up with Dr. Arvilla Meres GI in 1 month Please take fluconazole for 20 days total Please take sucralfate for 2 weeks then stop Please take protonix BID for 3 months  Please avoid all alcoholic beverages   Discharge Condition: STABLE   CODE STATUS: FULL  DIET: carb modified     Brief Hospitalization Summary: Please see all hospital notes, images, labs for full details of the hospitalization. ADMISSION HPI:  Amythest Orama  is a 55 y.o. female, with history of DMII, HLD, gout, and more presents to the ED with a chief complaint of generalized weakness.  Patient reports that this has been going on for about a week.  Today she was so weak she could not walk.  Patient normally ambulates with a cane.  She reports that sitting in her chair she felt woozy.  She admits to chronic nausea and vomiting, the last episode of emesis being yesterday.  Emesis is nonbloody.  Patient reports normal appetite.  Patient denies any chest pain, shortness of breath, cough.  She admits to epigastric abdominal pain that feels sharp.  Nothing makes it worse.  It was improved but not relieved with Pepto-Bismol.  Patient has no dysuria, and has noticed no change in urine output.  Patient does report chronic loose stools as well.  Her last bowel movement was day before yesterday, liquid, brown.  Having a bowel movement does make her abdominal pain better.  The nausea, and loose stools have been present on and off for 2 weeks.  The belly pain has been present for 1 week.  The weakness has been progressively worse for an unknown amount of time, but acutely worse today.  The only other complaint patient has is that her glucose was high in the 300s  today.  This was postprandial.  Last hemoglobin A1c was 5.9, so 300 would be quite high for her.   Patient quit smoking and quit drinking 1 week ago.  She reports that up until then she was drinking 2 bottles of beer per day.  She has not had any tremors, hallucinations, seizures.  She reports that she does not use illicit drugs.  She is vaccinated for COVID.  Patient is full code.   In the ED Temp 98.5, heart rate 93-108, respiratory rate 16-22, blood pressure 105/61, satting at 100% VBG shows normal pH at 7.424 There is a leukocytosis of 15.4 platelets 147 Slight hyponatremia 133, hypokalemia 2.4, hypochloremia at 90, bicarb 28, BUN is 21, creatinine 2.11-at baseline, glucose 206 Magnesium 0.7, calcium 5.2 which is down from 8.0 at last admission Negative respiratory panel Troponin stable at 14 and 18 Lipase 20 CT abdomen shows cholelithiasis without cholecystitis, diverticulosis without diverticulitis, no bowel obstruction, no obstructing calculi, but does show dysplastic right kidney that is unchanged from before. Patient was given 1 L of normal saline, 40 mEq p.o. potassium, 30 mEq IV potassium, Zofran, magnesium 2 g, calcium gluconate 1 g, bladder scan showed 700 mL of urine, in and out cath done.  HOSPITAL COURSE BY PROBLEM LIST   Intractable nausea and vomiting - improving with supportive measures -As evidenced by severe electrolyte abnormalities seen on BMP -As evidenced by severe clinical dehydration -Appreciate GI consultation and recommendations for upper endoscopy later today -  Repleted electrolytes aggressively and provided IV fluid hydration   Hypokalemia/hypocalcemia/hyponatremia/hypomagnesemia -Aggressive replacement of electrolytes given -Continue IV fluid hydration -Monitor daily electrolytes studies -REPLETED     Generalized weakness -Likely multifactorial given chronic alcoholism, malnutrition and severe electrolyte abnormalities -Repleted IV fluids and  electrolytes -PT evaluation recommended SNF placement  -Consulted dietitian and supplements offered.  -Vitamin supplements as ordered   Chronic alcohol abuse -Patient reports last alcohol consumption was 1 week ago -Treated with CIWA protocol -Continue vitamin supplementation as ordered. -Consulted TOC regarding alcohol treatment options   Sinus Tachycardia - seen on EKG and telemetry monitor - possibly related to alcohol withdrawal and dehydration - bolused LR  - monitor for alcohol withdrawal symptoms    Leukocytosis -Likely reactive, WBC trending down    Type 2 diabetes mellitus, diet controlled -Follow-up hemoglobin A1c: 6.3%  -CBG (last 3)  Recent Labs    08/31/21 2117 09/01/21 0743 09/01/21 1114  GLUCAP 128* 116* 194*    Normocytic anemia -GI consulted for upper endoscopy -Protonix ordered per GI recommendations -Suspect nutritional component --repeat endoscopy in 3 months per GI service    Acute urinary retention  - I/O cath PRN if unable to void - try to avoid foley placement      DVT prophylaxis: SCDs Code Status: Full Family Communication: None present Disposition:  DC SNF   Status is: Inpatient  Discharge Diagnoses:  Active Problems:   Hypokalemia   Hypomagnesemia   Intractable nausea and vomiting   Hypocalcemia   Generalized weakness   Alcohol abuse   Acute urinary retention   ABLA (acute blood loss anemia)   Gastrointestinal hemorrhage   Discharge Instructions:  Allergies as of 09/01/2021   No Known Allergies      Medication List     STOP taking these medications    cefdinir 300 MG capsule Commonly known as: OMNICEF       TAKE these medications    (feeding supplement) PROSource Plus liquid Take 30 mLs by mouth 3 (three) times daily between meals.   calcium carbonate 500 MG chewable tablet Commonly known as: TUMS - dosed in mg elemental calcium Chew 1 tablet (200 mg of elemental calcium total) by mouth 2 (two) times  daily with a meal for 14 days.   fluconazole 200 MG tablet Commonly known as: DIFLUCAN Take 1 tablet (200 mg total) by mouth daily for 20 days.   folic acid 1 MG tablet Commonly known as: FOLVITE Take 1 tablet (1 mg total) by mouth daily.   magnesium oxide 400 (240 Mg) MG tablet Commonly known as: MAG-OX Take 1 tablet (400 mg total) by mouth daily.   multivitamin with minerals Tabs tablet Take 1 tablet by mouth daily. Start taking on: September 02, 2021   nystatin powder Commonly known as: MYCOSTATIN/NYSTOP Apply topically 3 (three) times daily.   ondansetron 4 MG tablet Commonly known as: ZOFRAN Take 1 tablet (4 mg total) by mouth every 6 (six) hours as needed for nausea or vomiting.   pantoprazole 40 MG tablet Commonly known as: PROTONIX Take 1 tablet (40 mg total) by mouth 2 (two) times daily before a meal.   sucralfate 1 g tablet Commonly known as: CARAFATE Take 1 tablet (1 g total) by mouth 4 (four) times daily -  with meals and at bedtime for 14 days.   thiamine 100 MG tablet Take 1 tablet (100 mg total) by mouth daily. Start taking on: September 02, 2021        Contact information  for follow-up providers     [facility]. Schedule an appointment as soon as possible for a visit in 2 week(s).   Why: Call to schedule appointment and establish primary care provider. Contact information: [address] [place] [id number] [place], [place], DO. Schedule an appointment as soon as possible for a visit in 1 month(s).   Specialty: Gastroenterology Why: Hospital Follow Up Contact information: [place] [address] [phone]              Contact information for after-discharge care     [facility] .   Service: Skilled Nursing Contact information: [place] [phone]                     No Known Allergies Allergies as of 09/01/2021   No Known Allergies      Medication List     STOP taking these medications    cefdinir 300 MG capsule Commonly known as: OMNICEF       TAKE these medications    (feeding supplement) PROSource Plus liquid Take 30 mLs by mouth 3 (three) times daily between meals.   calcium carbonate 500 MG chewable tablet Commonly known as: TUMS - dosed in mg elemental calcium Chew 1 tablet (200 mg of elemental calcium total) by mouth 2 (two) times daily with a meal for 14 days.   fluconazole 200 MG tablet Commonly known as: DIFLUCAN Take 1 tablet (200 mg total) by mouth daily for 20 days.   folic acid 1 MG tablet Commonly known as: FOLVITE Take 1 tablet (1 mg total) by mouth daily.   magnesium oxide 400 (240 Mg) MG tablet Commonly known as: MAG-OX Take 1 tablet (400 mg total) by mouth daily.   multivitamin with minerals Tabs tablet Take 1 tablet by mouth daily. Start taking on: September 02, 2021   nystatin powder Commonly known as: MYCOSTATIN/NYSTOP Apply topically 3 (three) times daily.   ondansetron 4 MG tablet Commonly known as: ZOFRAN Take 1 tablet (4 mg total) by mouth every 6 (six) hours as needed for nausea or vomiting.   pantoprazole 40 MG tablet Commonly known as: PROTONIX Take 1 tablet (40 mg total) by mouth 2 (two) times daily before a meal.   sucralfate 1 g tablet Commonly known as: CARAFATE Take 1 tablet (1 g total) by mouth 4 (four) times daily -  with meals and at bedtime for 14 days.   thiamine 100 MG tablet Take 1 tablet (100 mg total) by mouth daily. Start taking on: September 02, 2021        Procedures/Studies: CT ABDOMEN PELVIS WO CONTRAST  Result Date: 08/26/2021 CLINICAL DATA:  Nausea, weakness, abdominal pain EXAM: CT ABDOMEN AND PELVIS WITHOUT CONTRAST TECHNIQUE: Multidetector CT imaging of the abdomen and pelvis was performed following the standard protocol without IV contrast.  COMPARISON:  08/06/2021 FINDINGS: Lower chest: No acute pleural or parenchymal lung disease. Hepatobiliary: Multiple small gallstones are identified, with no evidence of acute cholecystitis. Unenhanced imaging of the liver is unremarkable. Pancreas: Unremarkable. No pancreatic ductal dilatation or surrounding inflammatory changes. Spleen: Normal in size without focal abnormality. Adrenals/Urinary Tract: Stable dysplastic right kidney. Scattered areas of cortical scarring are again seen within the left kidney, unchanged. No left-sided calculi or obstructive uropathy. Stable low-attenuation left adrenal nodule measuring 1.7 cm consistent with adenoma.  The right adrenals unremarkable. Stomach/Bowel: No bowel obstruction or ileus. Normal appendix right lower quadrant. Scattered sigmoid diverticulosis without diverticulitis. No bowel wall thickening or inflammatory change. Vascular/Lymphatic: Aortic atherosclerosis. No enlarged abdominal or pelvic lymph nodes. Reproductive: Uterus and bilateral adnexa are unremarkable. Other: No free fluid or free gas. Stable small fat containing umbilical hernia. Musculoskeletal: No acute or destructive bony lesions. Reconstructed images demonstrate no additional findings. IMPRESSION: 1. Cholelithiasis without cholecystitis. 2. Minimal sigmoid diverticulosis without diverticulitis. 3. No bowel obstruction or ileus. 4. No urinary tract calculi or obstructive uropathy. Dysplastic right kidney unchanged. Electronically Signed   By: Randa Ngo M.D.   On: 08/26/2021 18:35   CT ABDOMEN PELVIS WO CONTRAST  Result Date: 08/06/2021 CLINICAL DATA:  Nausea and vomiting. Weakness. Poor food intake. Stage Ali elevated creatinine on today's labs. EXAM: CT ABDOMEN AND PELVIS WITHOUT CONTRAST TECHNIQUE: Multidetector CT imaging of the abdomen and pelvis was performed following the standard protocol without IV contrast. COMPARISON:  04/23/2021 FINDINGS: Lower chest: Linear subsegmental  atelectasis or scarring in the right lower lobe. Hepatobiliary: Faint dependent heterogeneity in the gallbladder, potentially from sludge or gallstones. No gallbladder wall thickening or pericholecystic fluid. The liver appears otherwise unremarkable. Pancreas: Unremarkable Spleen: Unremarkable Adrenals/Urinary Tract: 1.9 by 2.5 cm left adrenal mass with internal density 16 Hounsfield units, indeterminate. The right adrenal gland appears normal. There is only a small nodule of tissue at the expected location of the right kidney, compatible with dysplastic kidney. Urinary bladder unremarkable. Retained fetal lobulation of the left kidney. No hydronephrosis or hydroureter. Stomach/Bowel: Fatty deposition in the wall of the cecum which is typically an incidental normal variant although has a weak association with inflammatory bowel disease. Currently the terminal ileum appears grossly unremarkable. Normal appendix. The colon is relatively empty which correlates with the patient's history of reduced oral intake. There some scattered diverticula of the descending and proximal sigmoid colon without findings of active diverticulitis. No dilated bowel. Vascular/Lymphatic: Unremarkable Reproductive: Unremarkable Other: No supplemental non-categorized findings. Musculoskeletal: Stable mild SI joint arthropathy with suspected erosions along the SI joints. Mild to moderate lumbar spondylosis and degenerative disc disease. Small umbilical hernia contains adipose tissue. Bridging spurring in the lower thoracic spine. IMPRESSION: 1. A specific cause for the patient's symptoms is not identified. There is currently no left hydronephrosis or hydroureter to correlate with the patient's recent elevated creatinine. 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 2. Stable left adrenal mass, with nonspecific internal density. 3. Sludge versus gallstones in the gallbladder. No pericholecystic fluid or gallbladder wall  thickening. 4. Suspected chronic erosions along the SI joints compatible with bilateral sacroiliitis. Electronically Signed   By: Van Clines M.D.   On: 08/06/2021 14:58   US RENAL  Result Date: 08/27/2021 CLINICAL DATA:  History of right nephrectomy, abnormal CT scan yesterday EXAM: RENAL / URINARY TRACT ULTRASOUND COMPLETE COMPARISON:  CT abdomen/pelvis 1 day prior FINDINGS: Right Kidney: Surgically absent Left Kidney: Renal measurements: 9.9 cm x 5.8 cm x 5.8 cm = volume: 175 mL. Two anecoic lesions are seen measuring up to 1.4 cm consistent with cysts. There is an echogenic focus in the left kidney lower pole measuring 5 mm. Note that no stone was seen on the CT abdomen/pelvis from 1 day prior. There is no hydronephrosis. Bladder: Appears normal for degree of bladder distention. Other: Shadowing gallstones are incidentally noted in the gallbladder lumen. IMPRESSION: 1. 5 mm echogenic focus in the left kidney is favored to be artifactual, as no stone  was seen on the CT abdomen/pelvis from 1 day prior. No left hydronephrosis. 2. Two left renal cysts. 3. Status post right nephrectomy. 4. Cholelithiasis. Electronically Signed   By: Valetta Mole M.D.   On: 08/27/2021 09:07   DG Chest Port 1 View  Result Date: 08/26/2021 CLINICAL DATA:  Nausea and generalized weakness. EXAM: PORTABLE CHEST 1 VIEW COMPARISON:  August 06, 2021 FINDINGS: Low lung volumes are seen. There is no evidence of acute infiltrate, pleural effusion or pneumothorax. The heart size and mediastinal contours are within normal limits. Stable chronic and degenerative changes seen throughout the thoracic spine and bilateral shoulders. IMPRESSION: Low lung volumes without acute or active cardiopulmonary disease. Electronically Signed   By: Virgina Norfolk M.D.   On: 08/26/2021 15:25   DG Chest Port 1 View  Result Date: 08/06/2021 CLINICAL DATA:  Concern for sepsis EXAM: PORTABLE CHEST 1 VIEW COMPARISON:  Chest x-ray dated November 26, 2015 FINDINGS: Cardiac and mediastinal contours are within normal limits. Bibasilar atelectasis. Lungs otherwise clear. No evidence of pleural effusion or pneumothorax. IMPRESSION: No active disease. Electronically Signed   By: Yetta Glassman M.D.   On: 08/06/2021 13:45     Subjective: Pt without complaints.  Pt agreeable to rehab therapy.   Discharge Exam: Vitals:   08/31/21 2125 09/01/21 0512  BP: 116/77 129/73  Pulse: 99 (!) 102  Resp: 17 18  Temp: 98.3 F (36.8 C) 98.6 F (37 C)  SpO2: 97% 97%   Vitals:   08/31/21 0907 08/31/21 1342 08/31/21 2125 09/01/21 0512  BP: 104/60 102/63 116/77 129/73  Pulse: (!) 121 (!) 108 99 (!) 102  Resp: '18 19 17 18  '$ Temp:  98.7 F (37.1 C) 98.3 F (36.8 C) 98.6 F (37 C)  TempSrc:  Oral Oral Oral  SpO2: 99% 100% 97% 97%  Weight:      Height:       General exam: chronically ill, disheveled appearing female, Appears calm and comfortable  Respiratory system: Clear to auscultation. Respiratory effort normal. Cardiovascular system: normal S1 & S2 heard. No JVD, murmurs, rubs, gallops or clicks. No pedal edema. Gastrointestinal system: Abdomen is nondistended, soft and nontender. No organomegaly or masses felt. Normal bowel sounds heard. Central nervous system: Alert and oriented. No focal neurological deficits. Extremities: Symmetric 5 x 5 power. Skin: No rashes, lesions or ulcers Psychiatry: Judgement and insight appear poor. Mood & affect flat.    The results of significant diagnostics from this hospitalization (including imaging, microbiology, ancillary and laboratory) are listed below for reference.     Microbiology: Recent Results (from the past 240 hour(s))  Resp Panel by RT-PCR (Flu A&B, Covid) Nasopharyngeal Swab     Status: None   Collection Time: 08/26/21  4:39 PM   Specimen: Nasopharyngeal Swab; Nasopharyngeal(NP) swabs in vial transport medium  Result Value Ref Range Status   SARS Coronavirus 2 by RT PCR NEGATIVE NEGATIVE  Final    Comment: (NOTE) SARS-CoV-2 target nucleic acids are NOT DETECTED.  The SARS-CoV-2 RNA is generally detectable in upper respiratory specimens during the acute phase of infection. The lowest concentration of SARS-CoV-2 viral copies this assay can detect is 138 copies/mL. A negative result does not preclude SARS-Cov-2 infection and should not be used as the sole basis for treatment or other patient management decisions. A negative result may occur with  improper specimen collection/handling, submission of specimen other than nasopharyngeal swab, presence of viral mutation(s) within the areas targeted by this assay, and inadequate number of  viral copies(<138 copies/mL). A negative result must be combined with clinical observations, patient history, and epidemiological information. The expected result is Negative.  Fact Sheet for Patients:  EntrepreneurPulse.com.au  Fact Sheet for Healthcare Providers:  IncredibleEmployment.be  This test is no t yet approved or cleared by the [place] FDA and  has been authorized for detection and/or diagnosis of SARS-CoV-2 by FDA under an Emergency Use Authorization (EUA). This EUA will remain  in effect (meaning this test can be used) for the duration of the COVID-19 declaration under Section 564(b)(1) of the Act, 21 U.S.C.section 360bbb-3(b)(1), unless the authorization is terminated  or revoked sooner.       Influenza A by PCR NEGATIVE NEGATIVE Final   Influenza B by PCR NEGATIVE NEGATIVE Final    Comment: (NOTE) The Xpert Xpress SARS-CoV-2/FLU/RSV plus assay is intended as an aid in the diagnosis of influenza from Nasopharyngeal swab specimens and should not be used as a sole basis for treatment. Nasal washings and aspirates are unacceptable for Xpert Xpress SARS-CoV-2/FLU/RSV testing.  Fact Sheet for Patients: EntrepreneurPulse.com.au  Fact Sheet for Healthcare  Providers: IncredibleEmployment.be  This test is not yet approved or cleared by the [place] FDA and has been authorized for detection and/or diagnosis of SARS-CoV-2 by FDA under an Emergency Use Authorization (EUA). This EUA will remain in effect (meaning this test can be used) for the duration of the COVID-19 declaration under Section 564(b)(1) of the Act, 21 U.S.C. section 360bbb-3(b)(1), unless the authorization is terminated or revoked.  Performed at [facility], [address], [place], [place] [address]      Labs: BNP (last 3 results) No results for input(s): BNP in the last 8760 hours. Basic Metabolic Panel: Recent Labs  Lab 08/26/21 1503 08/27/21 0610 08/28/21 0637 08/29/21 0447 08/31/21 0950  NA 133* 133* 136 135 136  K 2.4* 2.4* 3.2* 3.8 3.8  CL 90* 95* 100 101 103  CO2 '28 27 28 28 28  '$ GLUCOSE 206* 107* 120* 82 190*  BUN 21* 20 22* 20 21*  CREATININE 2.11* 1.92* 1.75* 1.72* 1.80*  CALCIUM 5.2* 5.3* 7.2* 7.6* 7.7*  MG 0.7* 1.2* 1.9 2.1 1.1*   Liver Function Tests: Recent Labs  Lab 08/26/21 1503 08/27/21 0610 08/28/21 0637 08/29/21 0447  AST 18 12* 9* 11*  ALT '6 7 6 7  '$ ALKPHOS 74 61 61 63  BILITOT 0.8 0.9 0.8 0.7  PROT 6.4* 5.6* 5.7* 5.8*  ALBUMIN 2.1* 1.8* 1.9* 1.8*   Recent Labs  Lab 08/26/21 1748  LIPASE 20   No results for input(s): AMMONIA in the last 168 hours. CBC: Recent Labs  Lab 08/26/21 1503 08/27/21 0610 08/28/21 0637 08/29/21 0447  WBC 15.4* 11.7* 11.9* 13.3*  NEUTROABS 13.5* 9.2* 8.9* 9.9*  HGB 8.5* 6.7* 10.2* 10.4*  HCT 25.6* 21.1* 31.2* 31.6*  MCV 79.8* 81.2 83.4 84.7  PLT 147* 121* 154 168   Cardiac Enzymes: No results for input(s): CKTOTAL, CKMB, CKMBINDEX, TROPONINI in the last 168 hours. BNP: Invalid input(s): POCBNP CBG: Recent Labs  Lab 08/31/21 1122 08/31/21 1608 08/31/21 2117 09/01/21 0743 09/01/21 1114  GLUCAP 167* 120* 128* 116* 194*   D-Dimer No results for input(s): DDIMER  in the last 72 hours. Hgb A1c No results for input(s): HGBA1C in the last 72 hours. Lipid Profile No results for input(s): CHOL, HDL, LDLCALC, TRIG, CHOLHDL, LDLDIRECT in the last 72 hours. Thyroid function studies No results for input(s): TSH, T4TOTAL, T3FREE, THYROIDAB in the last 72 hours.  Invalid input(s): FREET3 Anemia work up No results for input(s): VITAMINB12, FOLATE, FERRITIN, TIBC, IRON, RETICCTPCT in the last 72 hours. Urinalysis    Component Value Date/Time   COLORURINE YELLOW 08/26/2021 2004   APPEARANCEUR CLEAR 08/26/2021 2004   LABSPEC 1.012 08/26/2021 2004   PHURINE 5.0 08/26/2021 2004   GLUCOSEU NEGATIVE 08/26/2021 2004   HGBUR SMALL (A) 08/26/2021 2004   BILIRUBINUR NEGATIVE 08/26/2021 2004   [place] NEGATIVE 08/26/2021 2004   PROTEINUR NEGATIVE 08/26/2021 2004   NITRITE NEGATIVE 08/26/2021 2004   LEUKOCYTESUR NEGATIVE 08/26/2021 2004   Sepsis Labs Invalid input(s): PROCALCITONIN,  WBC,  LACTICIDVEN Microbiology Recent Results (from the past 240 hour(s))  Resp Panel by RT-PCR (Flu A&B, Covid) Nasopharyngeal Swab     Status: None   Collection Time: 08/26/21  4:39 PM   Specimen: Nasopharyngeal Swab; Nasopharyngeal(NP) swabs in vial transport medium  Result Value Ref Range Status   SARS Coronavirus 2 by RT PCR NEGATIVE NEGATIVE Final    Comment: (NOTE) SARS-CoV-2 target nucleic acids are NOT DETECTED.  The SARS-CoV-2 RNA is generally detectable in upper respiratory specimens during the acute phase of infection. The lowest concentration of SARS-CoV-2 viral copies this assay can detect is 138 copies/mL. A negative result does not preclude SARS-Cov-2 infection and should not be used as the sole basis for treatment or other patient management decisions. A negative result may occur with  improper specimen collection/handling, submission of specimen other than nasopharyngeal swab, presence of viral mutation(s) within the areas targeted by this assay, and  inadequate number of viral copies(<138 copies/mL). A negative result must be combined with clinical observations, patient history, and epidemiological information. The expected result is Negative.  Fact Sheet for Patients:  EntrepreneurPulse.com.au  Fact Sheet for Healthcare Providers:  IncredibleEmployment.be  This test is no t yet approved or cleared by the [place] FDA and  has been authorized for detection and/or diagnosis of SARS-CoV-2 by FDA under an Emergency Use Authorization (EUA). This EUA will remain  in effect (meaning this test can be used) for the duration of the COVID-19 declaration under Section 564(b)(1) of the Act, 21 U.S.C.section 360bbb-3(b)(1), unless the authorization is terminated  or revoked sooner.       Influenza A by PCR NEGATIVE NEGATIVE Final   Influenza B by PCR NEGATIVE NEGATIVE Final    Comment: (NOTE) The Xpert Xpress SARS-CoV-2/FLU/RSV plus assay is intended as an aid in the diagnosis of influenza from Nasopharyngeal swab specimens and should not be used as a sole basis for treatment. Nasal washings and aspirates are unacceptable for Xpert Xpress SARS-CoV-2/FLU/RSV testing.  Fact Sheet for Patients: EntrepreneurPulse.com.au  Fact Sheet for Healthcare Providers: IncredibleEmployment.be  This test is not yet approved or cleared by the [place] FDA and has been authorized for detection and/or diagnosis of SARS-CoV-2 by FDA under an Emergency Use Authorization (EUA). This EUA will remain in effect (meaning this test can be used) for the duration of the COVID-19 declaration under Section 564(b)(1) of the Act, 21 U.S.C. section 360bbb-3(b)(1), unless the authorization is terminated or revoked.  Performed at [facility], [address] [place], [place] [address]    Time coordinating discharge: 38 mins   SIGNED:  Irwin Brakeman, MD  [facility] 09/01/2021, 11:27 AM How to contact the [facility] Attending or Consulting provider [place] or covering provider during after hours [place], for this patient?  Check the care team in [facility] and look for a) attending/consulting [place] provider listed and b) the  [place] team listed Log into [email or web address] and use [place]'s universal password to access. If you do not have the password, please contact the hospital operator. Locate the [facility] provider you are looking for under [facility] and page to a number that you can be directly reached. If you still have difficulty reaching the provider, please page the [facility] (Director on Call) for the Hospitalists listed on amion for assistance.

## 2021-09-01 NOTE — Care Management Important Message (Signed)
Important Message  Patient Details  Name: Courtney Dyer MRN: [id number] Date of Birth: 1966-09-28   Medicare Important Message Given:  Yes     Tommy Medal 09/01/2021, 2:13 PM

## 2021-09-01 NOTE — TOC Transition Note (Signed)
Transition of Care [facility]) - CM/SW Discharge Note   Patient Details  Name: Courtney Dyer MRN: [id number] Date of Birth: 1966/07/15  Transition of Care [facility]) CM/SW Contact:  Natasha Bence, LCSW Phone Number: 09/01/2021, 1:33 PM   Clinical Narrative:    Jackelyn Poling with [facility] reported that they are able to take patient. Patient agreeable to [place]. CSW called EMS and completed med necessity. Nurse to call report. TOC signing off.    Final next level of care: Skilled Nursing Facility Barriers to Discharge: Continued Medical Work up   Patient Goals and CMS Choice Patient states their goals for this hospitalization and ongoing recovery are:: Rehab with SNF CMS Medicare.gov Compare Post Acute Care list provided to:: Patient Choice offered to / list presented to : Patient  Discharge Placement                Patient to be transferred to facility by: [facility] EMS Name of family member notified: Moore,Carolyn [facility])   [phone] Patient and family notified of of transfer: 09/01/21  Discharge Plan and Services In-house Referral: Clinical Social Work              DME Arranged: N/A                    Social Determinants of Health ([place]) Interventions     Readmission Risk Interventions Readmission Risk Prevention Plan 08/27/2021 04/27/2021  Medication Screening - Complete  Transportation Screening Complete Complete  Home Care Screening Complete -  Medication Review (RN CM) Complete -  Some recent data might be hidden

## 2021-09-01 NOTE — Progress Notes (Signed)
Report called to Surveyor, quantity at [place].

## 2021-09-01 NOTE — Discharge Instructions (Signed)

## 2021-09-09 LAB — SURGICAL PATHOLOGY

## 2021-09-15 ENCOUNTER — Other Ambulatory Visit (HOSPITAL_COMMUNITY): Payer: Self-pay | Admitting: Internal Medicine

## 2021-09-15 DIAGNOSIS — N6452 Nipple discharge: Secondary | ICD-10-CM

## 2021-10-15 ENCOUNTER — Ambulatory Visit: Payer: Medicare Other | Admitting: Internal Medicine

## 2021-10-15 ENCOUNTER — Encounter: Payer: Self-pay | Admitting: Internal Medicine

## 2021-10-27 ENCOUNTER — Other Ambulatory Visit: Payer: Self-pay

## 2021-10-27 ENCOUNTER — Ambulatory Visit (HOSPITAL_COMMUNITY)
Admission: RE | Admit: 2021-10-27 | Discharge: 2021-10-27 | Disposition: A | Discharge status: Home / Self Care | Payer: Medicare Other | Source: Ambulatory Visit | Attending: Internal Medicine | Admitting: Internal Medicine

## 2021-10-27 DIAGNOSIS — N6452 Nipple discharge: Secondary | ICD-10-CM

## 2021-10-27 DIAGNOSIS — A419 Sepsis, unspecified organism: Secondary | ICD-10-CM | POA: Diagnosis not present

## 2021-10-27 DIAGNOSIS — E876 Hypokalemia: Secondary | ICD-10-CM | POA: Diagnosis not present

## 2021-10-29 ENCOUNTER — Inpatient Hospital Stay (HOSPITAL_COMMUNITY)
Admission: EM | Admit: 2021-10-29 | Discharge: 2021-11-11 | DRG: 871 | Disposition: A | Discharge status: Hospice | Payer: Medicare Other | Source: Skilled Nursing Facility | Attending: Internal Medicine | Admitting: Internal Medicine

## 2021-10-29 ENCOUNTER — Emergency Department (HOSPITAL_COMMUNITY): Payer: Medicare Other

## 2021-10-29 ENCOUNTER — Encounter (HOSPITAL_COMMUNITY): Payer: Self-pay

## 2021-10-29 DIAGNOSIS — R0689 Other abnormalities of breathing: Secondary | ICD-10-CM

## 2021-10-29 DIAGNOSIS — D7589 Other specified diseases of blood and blood-forming organs: Secondary | ICD-10-CM | POA: Diagnosis present

## 2021-10-29 DIAGNOSIS — N184 Chronic kidney disease, stage 4 (severe): Secondary | ICD-10-CM | POA: Diagnosis present

## 2021-10-29 DIAGNOSIS — N170 Acute kidney failure with tubular necrosis: Secondary | ICD-10-CM | POA: Diagnosis present

## 2021-10-29 DIAGNOSIS — N390 Urinary tract infection, site not specified: Secondary | ICD-10-CM | POA: Diagnosis present

## 2021-10-29 DIAGNOSIS — R32 Unspecified urinary incontinence: Secondary | ICD-10-CM | POA: Diagnosis present

## 2021-10-29 DIAGNOSIS — R9431 Abnormal electrocardiogram [ECG] [EKG]: Secondary | ICD-10-CM

## 2021-10-29 DIAGNOSIS — D6959 Other secondary thrombocytopenia: Secondary | ICD-10-CM | POA: Diagnosis present

## 2021-10-29 DIAGNOSIS — A419 Sepsis, unspecified organism: Principal | ICD-10-CM | POA: Diagnosis present

## 2021-10-29 DIAGNOSIS — N611 Abscess of the breast and nipple: Secondary | ICD-10-CM

## 2021-10-29 DIAGNOSIS — M109 Gout, unspecified: Secondary | ICD-10-CM | POA: Diagnosis present

## 2021-10-29 DIAGNOSIS — Z20822 Contact with and (suspected) exposure to covid-19: Secondary | ICD-10-CM | POA: Diagnosis present

## 2021-10-29 DIAGNOSIS — D509 Iron deficiency anemia, unspecified: Secondary | ICD-10-CM | POA: Diagnosis present

## 2021-10-29 DIAGNOSIS — Z8249 Family history of ischemic heart disease and other diseases of the circulatory system: Secondary | ICD-10-CM

## 2021-10-29 DIAGNOSIS — E119 Type 2 diabetes mellitus without complications: Secondary | ICD-10-CM

## 2021-10-29 DIAGNOSIS — K219 Gastro-esophageal reflux disease without esophagitis: Secondary | ICD-10-CM | POA: Diagnosis present

## 2021-10-29 DIAGNOSIS — E43 Unspecified severe protein-calorie malnutrition: Secondary | ICD-10-CM | POA: Diagnosis present

## 2021-10-29 DIAGNOSIS — Z23 Encounter for immunization: Secondary | ICD-10-CM

## 2021-10-29 DIAGNOSIS — Z79899 Other long term (current) drug therapy: Secondary | ICD-10-CM

## 2021-10-29 DIAGNOSIS — Z992 Dependence on renal dialysis: Secondary | ICD-10-CM

## 2021-10-29 DIAGNOSIS — E860 Dehydration: Secondary | ICD-10-CM | POA: Diagnosis present

## 2021-10-29 DIAGNOSIS — D631 Anemia in chronic kidney disease: Secondary | ICD-10-CM | POA: Diagnosis present

## 2021-10-29 DIAGNOSIS — Z6841 Body Mass Index (BMI) 40.0 and over, adult: Secondary | ICD-10-CM

## 2021-10-29 DIAGNOSIS — Z794 Long term (current) use of insulin: Secondary | ICD-10-CM

## 2021-10-29 DIAGNOSIS — E8729 Other acidosis: Secondary | ICD-10-CM

## 2021-10-29 DIAGNOSIS — G9341 Metabolic encephalopathy: Secondary | ICD-10-CM | POA: Diagnosis present

## 2021-10-29 DIAGNOSIS — Z515 Encounter for palliative care: Secondary | ICD-10-CM

## 2021-10-29 DIAGNOSIS — E876 Hypokalemia: Secondary | ICD-10-CM | POA: Diagnosis present

## 2021-10-29 DIAGNOSIS — E782 Mixed hyperlipidemia: Secondary | ICD-10-CM | POA: Diagnosis present

## 2021-10-29 DIAGNOSIS — J189 Pneumonia, unspecified organism: Secondary | ICD-10-CM | POA: Diagnosis present

## 2021-10-29 DIAGNOSIS — E861 Hypovolemia: Secondary | ICD-10-CM | POA: Diagnosis present

## 2021-10-29 DIAGNOSIS — N179 Acute kidney failure, unspecified: Secondary | ICD-10-CM

## 2021-10-29 DIAGNOSIS — E8809 Other disorders of plasma-protein metabolism, not elsewhere classified: Secondary | ICD-10-CM | POA: Diagnosis present

## 2021-10-29 DIAGNOSIS — D649 Anemia, unspecified: Secondary | ICD-10-CM

## 2021-10-29 DIAGNOSIS — E871 Hypo-osmolality and hyponatremia: Secondary | ICD-10-CM | POA: Diagnosis present

## 2021-10-29 DIAGNOSIS — F17210 Nicotine dependence, cigarettes, uncomplicated: Secondary | ICD-10-CM | POA: Diagnosis present

## 2021-10-29 DIAGNOSIS — I129 Hypertensive chronic kidney disease with stage 1 through stage 4 chronic kidney disease, or unspecified chronic kidney disease: Secondary | ICD-10-CM | POA: Diagnosis present

## 2021-10-29 DIAGNOSIS — Z66 Do not resuscitate: Secondary | ICD-10-CM | POA: Diagnosis present

## 2021-10-29 DIAGNOSIS — E872 Acidosis, unspecified: Secondary | ICD-10-CM | POA: Diagnosis present

## 2021-10-29 DIAGNOSIS — E1122 Type 2 diabetes mellitus with diabetic chronic kidney disease: Secondary | ICD-10-CM | POA: Diagnosis present

## 2021-10-29 DIAGNOSIS — D72829 Elevated white blood cell count, unspecified: Secondary | ICD-10-CM

## 2021-10-29 DIAGNOSIS — Z905 Acquired absence of kidney: Secondary | ICD-10-CM

## 2021-10-29 DIAGNOSIS — R579 Shock, unspecified: Secondary | ICD-10-CM

## 2021-10-29 DIAGNOSIS — R6521 Severe sepsis with septic shock: Secondary | ICD-10-CM | POA: Diagnosis present

## 2021-10-29 LAB — CBC WITH DIFFERENTIAL/PLATELET
Abs Immature Granulocytes: 0.14 10*3/uL — ABNORMAL HIGH (ref 0.00–0.07)
Basophils Absolute: 0 10*3/uL (ref 0.0–0.1)
Basophils Relative: 0 %
Eosinophils Absolute: 0.3 10*3/uL (ref 0.0–0.5)
Eosinophils Relative: 2 %
HCT: 21 % — ABNORMAL LOW (ref 36.0–46.0)
Hemoglobin: 7.1 g/dL — ABNORMAL LOW (ref 12.0–15.0)
Immature Granulocytes: 1 %
Lymphocytes Relative: 7 %
Lymphs Abs: 1.3 10*3/uL (ref 0.7–4.0)
MCH: 26.5 pg (ref 26.0–34.0)
MCHC: 33.8 g/dL (ref 30.0–36.0)
MCV: 78.4 fL — ABNORMAL LOW (ref 80.0–100.0)
Monocytes Absolute: 0.4 10*3/uL (ref 0.1–1.0)
Monocytes Relative: 3 %
Neutro Abs: 15.6 10*3/uL — ABNORMAL HIGH (ref 1.7–7.7)
Neutrophils Relative %: 87 %
Platelets: 239 10*3/uL (ref 150–400)
RBC: 2.68 MIL/uL — ABNORMAL LOW (ref 3.87–5.11)
RDW: 18.7 % — ABNORMAL HIGH (ref 11.5–15.5)
WBC: 17.8 10*3/uL — ABNORMAL HIGH (ref 4.0–10.5)
nRBC: 0 % (ref 0.0–0.2)

## 2021-10-29 LAB — BASIC METABOLIC PANEL
Anion gap: 16 — ABNORMAL HIGH (ref 5–15)
BUN: 188 mg/dL — ABNORMAL HIGH (ref 6–20)
CO2: 11 mmol/L — ABNORMAL LOW (ref 22–32)
Calcium: 7.4 mg/dL — ABNORMAL LOW (ref 8.9–10.3)
Chloride: 99 mmol/L (ref 98–111)
Creatinine, Ser: 10.6 mg/dL — ABNORMAL HIGH (ref 0.44–1.00)
GFR, Estimated: 4 mL/min — ABNORMAL LOW (ref >60)
Glucose, Bld: 181 mg/dL — ABNORMAL HIGH (ref 70–99)
Potassium: <2 mmol/L — CL (ref 3.5–5.1)
Sodium: 126 mmol/L — ABNORMAL LOW (ref 135–145)

## 2021-10-29 LAB — COMPREHENSIVE METABOLIC PANEL
ALT: 14 U/L (ref 0–44)
AST: 24 U/L (ref 15–41)
Albumin: 2.3 g/dL — ABNORMAL LOW (ref 3.5–5.0)
Alkaline Phosphatase: 104 U/L (ref 38–126)
Anion gap: 23 — ABNORMAL HIGH (ref 5–15)
BUN: 218 mg/dL — ABNORMAL HIGH (ref 6–20)
CO2: 11 mmol/L — ABNORMAL LOW (ref 22–32)
Calcium: 8.7 mg/dL — ABNORMAL LOW (ref 8.9–10.3)
Chloride: 91 mmol/L — ABNORMAL LOW (ref 98–111)
Creatinine, Ser: 12.87 mg/dL — ABNORMAL HIGH (ref 0.44–1.00)
GFR, Estimated: 3 mL/min — ABNORMAL LOW (ref >60)
Glucose, Bld: 96 mg/dL (ref 70–99)
Potassium: <2 mmol/L — CL (ref 3.5–5.1)
Sodium: 125 mmol/L — ABNORMAL LOW (ref 135–145)
Total Bilirubin: 0.9 mg/dL (ref 0.3–1.2)
Total Protein: 6.4 g/dL — ABNORMAL LOW (ref 6.5–8.1)

## 2021-10-29 LAB — RESP PANEL BY RT-PCR (FLU A&B, COVID) ARPGX2
Influenza A by PCR: NEGATIVE
Influenza B by PCR: NEGATIVE
SARS Coronavirus 2 by RT PCR: NEGATIVE

## 2021-10-29 LAB — PREPARE RBC (CROSSMATCH)

## 2021-10-29 LAB — CBC
HCT: 22.7 % — ABNORMAL LOW (ref 36.0–46.0)
Hemoglobin: 7.6 g/dL — ABNORMAL LOW (ref 12.0–15.0)
MCH: 27 pg (ref 26.0–34.0)
MCHC: 33.5 g/dL (ref 30.0–36.0)
MCV: 80.8 fL (ref 80.0–100.0)
Platelets: 246 10*3/uL (ref 150–400)
RBC: 2.81 MIL/uL — ABNORMAL LOW (ref 3.87–5.11)
RDW: 19.7 % — ABNORMAL HIGH (ref 11.5–15.5)
WBC: 23.9 10*3/uL — ABNORMAL HIGH (ref 4.0–10.5)
nRBC: 0 % (ref 0.0–0.2)

## 2021-10-29 LAB — LACTIC ACID, PLASMA
Lactic Acid, Venous: 1.4 mmol/L (ref 0.5–1.9)
Lactic Acid, Venous: 0.8 mmol/L (ref 0.5–1.9)

## 2021-10-29 LAB — URINALYSIS, MICROSCOPIC (REFLEX): WBC, UA: >50 WBC/hpf (ref 0–5)

## 2021-10-29 LAB — URINALYSIS, ROUTINE W REFLEX MICROSCOPIC
Bilirubin Urine: NEGATIVE
Glucose, UA: NEGATIVE mg/dL
Ketones, ur: 20 mg/dL — AB
Nitrite: POSITIVE — AB
Protein, ur: 100 mg/dL — AB
Specific Gravity, Urine: 1.02 (ref 1.005–1.030)
pH: 6 (ref 5.0–8.0)

## 2021-10-29 LAB — POC OCCULT BLOOD, ED: Fecal Occult Bld: NEGATIVE

## 2021-10-29 LAB — PROTIME-INR
INR: 1.4 — ABNORMAL HIGH (ref 0.8–1.2)
Prothrombin Time: 17.3 seconds — ABNORMAL HIGH (ref 11.4–15.2)

## 2021-10-29 LAB — MAGNESIUM: Magnesium: 2.3 mg/dL (ref 1.7–2.4)

## 2021-10-29 LAB — APTT: aPTT: 62 seconds — ABNORMAL HIGH (ref 24–36)

## 2021-10-29 MED ORDER — VANCOMYCIN HCL IN DEXTROSE 1-5 GM/200ML-% IV SOLN
1000.0000 mg | Freq: Once | INTRAVENOUS | Status: AC
Start: 2021-10-29 — End: 2021-10-29
  Administered 2021-10-29: 1000 mg via INTRAVENOUS
  Filled 2021-10-29: qty 200

## 2021-10-29 MED ORDER — VANCOMYCIN HCL IN DEXTROSE 1-5 GM/200ML-% IV SOLN
1000.0000 mg | Freq: Once | INTRAVENOUS | Status: AC
  Administered 2021-10-29: 1000 mg via INTRAVENOUS
  Filled 2021-10-29: qty 200

## 2021-10-29 MED ORDER — VANCOMYCIN HCL 1500 MG/300ML IV SOLN: 1500.0000 mg | INTRAVENOUS | Status: DC

## 2021-10-29 MED ORDER — SODIUM CHLORIDE 0.9 % IV SOLN
10.0000 mL/h | Freq: Once | INTRAVENOUS | Status: AC
  Administered 2021-10-29: 10 mL/h via INTRAVENOUS

## 2021-10-29 MED ORDER — POTASSIUM CHLORIDE 10 MEQ/50ML IV SOLN
10.0000 meq | INTRAVENOUS | Status: AC
  Filled 2021-10-29 (×2): qty 50

## 2021-10-29 MED ORDER — POTASSIUM CHLORIDE 10 MEQ/50ML IV SOLN
10.0000 meq | INTRAVENOUS | Status: DC
  Filled 2021-10-29 (×3): qty 50

## 2021-10-29 MED ORDER — SODIUM CHLORIDE 0.9 % IV SOLN
1.0000 g | INTRAVENOUS | Status: DC
  Administered 2021-10-30 – 2021-11-01 (×3): 1 g via INTRAVENOUS
  Filled 2021-10-29 (×4): qty 1

## 2021-10-29 MED ORDER — LIDOCAINE-EPINEPHRINE (PF) 2 %-1:200000 IJ SOLN
10.0000 mL | Freq: Once | INTRAMUSCULAR | Status: AC
  Administered 2021-10-29: 10 mL

## 2021-10-29 MED ORDER — METRONIDAZOLE 500 MG/100ML IV SOLN
500.0000 mg | Freq: Once | INTRAVENOUS | Status: AC
  Administered 2021-10-29: 500 mg via INTRAVENOUS
  Filled 2021-10-29: qty 100

## 2021-10-29 MED ORDER — LACTATED RINGERS IV BOLUS
1000.0000 mL | Freq: Once | INTRAVENOUS | Status: AC
  Administered 2021-10-29: 1000 mL via INTRAVENOUS

## 2021-10-29 MED ORDER — SODIUM CHLORIDE 0.9 % IV SOLN
250.0000 mL | INTRAVENOUS | Status: DC
  Administered 2021-10-29: 250 mL via INTRAVENOUS

## 2021-10-29 MED ORDER — LACTATED RINGERS IV SOLN: INTRAVENOUS | Status: DC

## 2021-10-29 MED ORDER — SODIUM CHLORIDE 0.9 % IV SOLN
2.0000 g | Freq: Once | INTRAVENOUS | Status: AC
  Administered 2021-10-29: 2 g via INTRAVENOUS
  Filled 2021-10-29: qty 2

## 2021-10-29 MED ORDER — SODIUM CHLORIDE 0.9 % IV SOLN
1.0000 g | INTRAVENOUS | Status: DC
  Filled 2021-10-29: qty 1

## 2021-10-29 MED ORDER — NOREPINEPHRINE 4 MG/250ML-% IV SOLN
2.0000 ug/min | INTRAVENOUS | Status: DC
  Administered 2021-10-29: 23:00:00 10 ug/min via INTRAVENOUS
  Administered 2021-10-29: 3 ug/min via INTRAVENOUS
  Filled 2021-10-29: qty 250

## 2021-10-29 MED ORDER — POTASSIUM CHLORIDE 10 MEQ/100ML IV SOLN
10.0000 meq | INTRAVENOUS | Status: AC
  Administered 2021-10-29 (×3): 10 meq via INTRAVENOUS
  Filled 2021-10-29 (×3): qty 100

## 2021-10-29 MED ORDER — LACTATED RINGERS IV BOLUS (SEPSIS)
800.0000 mL | Freq: Once | INTRAVENOUS | Status: AC
  Administered 2021-10-29: 800 mL via INTRAVENOUS

## 2021-10-29 MED ORDER — POTASSIUM CHLORIDE CRYS ER 20 MEQ PO TBCR
40.0000 meq | EXTENDED_RELEASE_TABLET | Freq: Once | ORAL | Status: AC
  Administered 2021-10-29: 40 meq via ORAL
  Filled 2021-10-29: qty 2

## 2021-10-29 MED ORDER — POTASSIUM CHLORIDE CRYS ER 20 MEQ PO TBCR
40.0000 meq | EXTENDED_RELEASE_TABLET | Freq: Once | ORAL | Status: DC
  Administered 2021-10-29: 40 meq via ORAL
  Filled 2021-10-29: qty 2

## 2021-10-29 MED ORDER — SODIUM CHLORIDE 0.9 % IV SOLN: 2.0000 g | Freq: Three times a day (TID) | INTRAVENOUS | Status: DC

## 2021-10-29 MED ORDER — SODIUM CHLORIDE 0.9 % IV BOLUS
1000.0000 mL | Freq: Once | INTRAVENOUS | Status: AC
  Administered 2021-10-29: 1000 mL via INTRAVENOUS

## 2021-10-29 MED ORDER — POTASSIUM CHLORIDE 10 MEQ/100ML IV SOLN
10.0000 meq | INTRAVENOUS | Status: DC
  Administered 2021-10-29: 10 meq via INTRAVENOUS
  Filled 2021-10-29: qty 100

## 2021-10-29 NOTE — Progress Notes (Addendum)
Pharmacy Antibiotic Note  Courtney Dyer is a 55 y.o. female admitted on 10/29/2021 with sepsis.  Pharmacy has been consulted for vanc/cefepime dosing.  Pt presented with anemia and unknown sepsis. Vanc/cefepime ordered empirically. Pt is in acute renal failure. Will dose vanc based on level for now.   Scr 12.87  Plan: Vanc 2g IV x1  Cefepime 1g IV q24 Vanc based on level in 2 days   Height: 5\' 4"  (162.6 cm) Weight: 109 kg (240 lb 4.8 oz) IBW/kg (Calculated) : 54.7  Temp (24hrs), Avg:97.2 F (36.2 C), Min:97.2 F (36.2 C), Max:97.2 F (36.2 C)  Recent Labs  Lab 10/29/21 1700  WBC 17.8*    CrCl cannot be calculated (Patient's most recent lab result is older than the maximum 21 days allowed.).    No Known Allergies  Antimicrobials this admission: 12/15 vanc>> 12/15 cefepime>> 12/15 flagyl>>  Dose adjustments this admission:   Microbiology results: 12/15 blood>> MRSA PCR>>  Onnie Boer, PharmD, BCIDP, AAHIVP, CPP Infectious Disease Pharmacist 10/29/2021 6:03 PM

## 2021-10-29 NOTE — ED Notes (Signed)
Pt placed in [place].

## 2021-10-29 NOTE — ED Provider Notes (Signed)
[place] Provider Note   CSN: [id number] Arrival date & time: 10/29/21  1643  COGNITIVE DECLINE  History No chief complaint on file.   Courtney Dyer is a 55 y.o. female.  HPI Patient sent from [place] with anemia.  Reportedly blood work today showed a hemoglobin of 6.1.  Further history is limited as I try to call [facility] but then was unable to get in touch with the nurse.  However the cousin is here who is closest family and she indicates that the patient is at her mental status baseline but has been having diarrhea and vomiting for several weeks to months.  No obvious blood.  She is also been dealing with a chronic right breast wound for several months that was biopsied and did not show cancer but has been draining.  Recently was on some antibiotics.  Past Medical History:  Diagnosis Date   Diabetes mellitus    Gout    High cholesterol     Patient Active Problem List   Diagnosis Date Noted   Pressure injury of skin 09/01/2021   ABLA (acute blood loss anemia)    Gastrointestinal hemorrhage    Intractable nausea and vomiting 08/26/2021   Hypocalcemia 08/26/2021   Generalized weakness 08/26/2021   Alcohol abuse 08/26/2021   Acute urinary retention 08/26/2021   CKD (chronic kidney disease) stage 3, GFR 30-59 ml/min (HCC) 08/08/2021   Candidal intertrigo 08/06/2021   Adrenal mass ([place]) 08/06/2021   Intractable vomiting    AKI (acute kidney injury) ([place]) 04/23/2021   Hypokalemia 04/23/2021   Chronic anemia 04/23/2021   Hypomagnesemia 04/23/2021   Bilateral impacted cerumen 12/24/2020   Cholesteatoma of external auditory canal, 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     Family History  Problem Relation Age of Onset   Hypertension Maternal Aunt     Social History   Tobacco Use   Smoking status: Every Day    Packs/day: 0.50    Types: Cigarettes   Smokeless tobacco: Never  Substance Use Topics   Alcohol use: Yes    Comment: daily   Drug use: No    Home Medications Prior to Admission medications   Medication Sig Start Date End Date Taking? Authorizing Provider  folic acid (FOLVITE) 1 MG tablet Take 1 tablet (1 mg total) by mouth daily. 08/10/21  Yes Tat, Shanon Brow, MD  magnesium oxide (MAG-OX) 400 (240 Mg) MG tablet Take 1 tablet (400 mg total) by mouth daily. 08/09/21  Yes Tat, Shanon Brow, MD  Multiple Vitamin (MULTIVITAMIN WITH MINERALS) TABS tablet Take 1 tablet by mouth daily. 09/02/21  Yes Johnson, Clanford L, MD  ondansetron (ZOFRAN) 4 MG tablet Take 1 tablet (4 mg total) by mouth every 6 (six) hours as needed for nausea or vomiting. 09/01/21  Yes Johnson, Clanford L, MD  pantoprazole (PROTONIX) 40 MG tablet Take 1 tablet (40 mg total) by mouth 2 (two) times  daily before a meal. 09/01/21 11/30/21 Yes Johnson, Clanford L, MD  sucralfate (CARAFATE) 1 g tablet Take 1 tablet (1 g total) by mouth 4 (four) times daily -  with meals and at bedtime for 14 days. 09/01/21 10/29/21 Yes Johnson, Clanford L, MD  thiamine 100 MG tablet Take 1 tablet (100 mg total) by mouth daily. 09/02/21  Yes Johnson, Clanford L, MD  traMADol (ULTRAM) 50 MG tablet Take 50 mg by mouth every 6 (six) hours as needed.   Yes [provider]  Nutritional Supplements (,FEEDING SUPPLEMENT, PROSOURCE PLUS) liquid Take 30 mLs by mouth 3 (three) times daily between meals. 09/01/21    Johnson, Clanford L, MD  nystatin (MYCOSTATIN/NYSTOP) powder Apply topically 3 (three) times daily. 09/01/21   Murlean Iba, MD    Allergies    Patient has no known allergies.  Review of Systems   Review of Systems  Unable to perform ROS: Mental status change   Physical Exam Updated Vital Signs BP 103/72    Pulse 100    Temp (!) 97 F (36.1 C)    Resp 18    Ht 5\' 4"  (1.626 m)    Wt 109 kg    LMP 09/26/2015    SpO2 100%    BMI 41.25 kg/m   Physical Exam Vitals and nursing note reviewed.  Constitutional:      Appearance: She is well-developed.  HENT:     Head: Normocephalic and atraumatic.     Right Ear: External ear normal.     Left Ear: External ear normal.     Nose: Nose normal.  Eyes:     General:        Right eye: No discharge.        Left eye: No discharge.  Cardiovascular:     Rate and Rhythm: Normal rate and regular rhythm.     Heart sounds: Normal heart sounds.  Pulmonary:     Effort: Pulmonary effort is normal.     Breath sounds: Normal breath sounds.  Abdominal:     General: There is no distension.     Palpations: Abdomen is soft.     Tenderness: There is no abdominal tenderness.  Skin:    General: Skin is warm and dry.  Neurological:     Mental Status: She is alert.     Comments: Patient moves extremities and answers all questions with "yes".  Seems to be at baseline per family.  Psychiatric:        Mood and Affect: Mood is not anxious.    ED Results / Procedures / Treatments   Labs (all labs ordered are listed, but only abnormal results are displayed) Labs Reviewed  COMPREHENSIVE METABOLIC PANEL - Abnormal; Notable for the following components:      Result Value   Sodium 125 (*)    Potassium <2.0 (*)    Chloride 91 (*)    CO2 11 (*)    BUN 218 (*)    Creatinine, Ser 12.87 (*)    Calcium 8.7 (*)    Total Protein 6.4 (*)    Albumin 2.3 (*)    GFR, Estimated 3 (*)    Anion gap 23 (*)    All other components within normal limits  CBC  WITH DIFFERENTIAL/PLATELET - Abnormal; Notable for the following components:   WBC 17.8 (*)    RBC 2.68 (*)    Hemoglobin 7.1 (*)    HCT 21.0 (*)    MCV 78.4 (*)  RDW 18.7 (*)    Neutro Abs 15.6 (*)    Abs Immature Granulocytes 0.14 (*)    All other components within normal limits  URINALYSIS, ROUTINE W REFLEX MICROSCOPIC - Abnormal; Notable for the following components:   Color, Urine GREEN (*)    APPearance CLOUDY (*)    Hgb urine dipstick SMALL (*)    Ketones, ur 20 (*)    Protein, ur 100 (*)    Nitrite POSITIVE (*)    Leukocytes,Ua LARGE (*)    All other components within normal limits  PROTIME-INR - Abnormal; Notable for the following components:   Prothrombin Time 17.3 (*)    INR 1.4 (*)    All other components within normal limits  APTT - Abnormal; Notable for the following components:   aPTT 62 (*)    All other components within normal limits  BASIC METABOLIC PANEL - Abnormal; Notable for the following components:   Sodium 126 (*)    Potassium <2.0 (*)    CO2 11 (*)    Glucose, Bld 181 (*)    BUN 188 (*)    Creatinine, Ser 10.60 (*)    Calcium 7.4 (*)    GFR, Estimated 4 (*)    Anion gap 16 (*)    All other components within normal limits  URINALYSIS, MICROSCOPIC (REFLEX) - Abnormal; Notable for the following components:   Bacteria, UA MANY (*)    All other components within normal limits  CBC - Abnormal; Notable for the following components:   WBC 23.9 (*)    RBC 2.81 (*)    Hemoglobin 7.6 (*)    HCT 22.7 (*)    RDW 19.7 (*)    All other components within normal limits  RESP PANEL BY RT-PCR (FLU A&B, COVID) ARPGX2  CULTURE, BLOOD (ROUTINE X 2)  CULTURE, BLOOD (ROUTINE X 2)  MRSA NEXT GEN BY PCR, NASAL  GASTROINTESTINAL PANEL BY PCR, STOOL (REPLACES STOOL CULTURE)  C DIFFICILE QUICK SCREEN W PCR REFLEX    LACTIC ACID, PLASMA  LACTIC ACID, PLASMA  MAGNESIUM  CBG MONITORING, ED  POC OCCULT BLOOD, ED  TYPE AND SCREEN  PREPARE RBC (CROSSMATCH)     EKG EKG Interpretation  Date/Time:  Thursday October 29 2021 20:12:53 EST Ventricular Rate:  88 PR Interval:  197 QRS Duration: 104 QT Interval:  448 QTC Calculation: 546 R Axis:   -12 Text Interpretation: Sinus rhythm Probable left atrial enlargement Low voltage, extremity leads Prolonged QT interval Baseline wander in lead(s) V4 Confirmed by Sherwood Gambler [phone]) on 10/29/2021 8:32:51 PM  Radiology CT ABDOMEN PELVIS WO CONTRAST  Result Date: 10/29/2021 CLINICAL DATA:  Sepsis EXAM: CT ABDOMEN AND PELVIS WITHOUT CONTRAST TECHNIQUE: Multidetector CT imaging of the abdomen and pelvis was performed following the standard protocol without IV contrast. COMPARISON:  08/26/2021 FINDINGS: Lower chest: Elevation of the right hemidiaphragm. Right base atelectasis or infiltrate. Left lung base clear. Hepatobiliary: Small layering gallstones within the gallbladder. Mild distention of the gallbladder. No focal hepatic abnormality. Pancreas: No focal abnormality or ductal dilatation. Spleen: No focal abnormality.  Normal size. Adrenals/Urinary Tract: Stable left adrenal adenoma. Dysplastic right kidney. No stones or hydronephrosis on the left. No renal mass. Foley catheter present in the bladder which is decompressed. Stomach/Bowel: Normal appendix. Sigmoid diverticulosis. No active diverticulitis. Stomach and small bowel decompressed, unremarkable. Vascular/Lymphatic: No evidence of aneurysm or adenopathy. Reproductive: Uterus and adnexa unremarkable.  No mass. Other: No free fluid or free air. Musculoskeletal: No acute bony abnormality. IMPRESSION:  Elevation of the right hemidiaphragm with right base atelectasis or infiltrate. Sigmoid diverticulosis.  No active diverticulitis. Cholelithiasis. Gallbladder mildly distended. This could be further evaluated with ultrasound if felt clinically indicated. Electronically Signed   By: Rolm Baptise M.D.   On: 10/29/2021 22:11   DG Chest Portable 1  View  Result Date: 10/29/2021 CLINICAL DATA:  IJ line placement EXAM: PORTABLE CHEST 1 VIEW COMPARISON:  10/29/2021 FINDINGS: Left internal jugular central line is in place with the tip at the cavoatrial junction. Elevation of the right hemidiaphragm with right base atelectasis/infiltrate. Left lung clear. No effusions or pneumothorax. No acute bony abnormality. IMPRESSION: Left central line tip at the cavoatrial junction.  No pneumothorax. Stable elevation of the right hemidiaphragm with right base atelectasis or infiltrate. Electronically Signed   By: Rolm Baptise M.D.   On: 10/29/2021 22:06   DG Chest Portable 1 View  Result Date: 10/29/2021 CLINICAL DATA:  Hypotension.  Abnormal labs. EXAM: PORTABLE CHEST 1 VIEW COMPARISON:  Chest radiograph dated August 26, 2021 FINDINGS: The heart size and mediastinal contours are within normal limits. Low lung volumes. Right basilar atelectasis/infiltrate. Advanced bilateral glenohumeral osteoarthritis. IMPRESSION: 1.  Low lung volumes with right basilar atelectasis/infiltrate. 2.  Advanced chronic bilateral glenohumeral osteoarthritis. Electronically Signed   By: Keane Police D.O.   On: 10/29/2021 18:18    Procedures Ultrasound ED Peripheral IV (Provider)  Date/Time: 10/29/2021 7:18 PM Performed by: Sherwood Gambler, MD Authorized by: Sherwood Gambler, MD   Procedure details:    Indications: multiple failed IV attempts and poor IV access     Skin Prep: chlorhexidine gluconate     Location:  Right AC   Angiocath:  18 G   Bedside Ultrasound Guided: Yes     Patient tolerated procedure without complications: Yes     Dressing applied: Yes   .Central Line  Date/Time: 10/29/2021 9:25 PM Performed by: Sherwood Gambler, MD Authorized by: Sherwood Gambler, MD   Consent:    Consent obtained:  Emergent situation Pre-procedure details:    Indication(s): central venous access and insufficient peripheral access     Hand hygiene: Hand hygiene performed prior  to insertion     Sterile barrier technique: All elements of maximal sterile technique followed     Skin preparation:  Chlorhexidine   Skin preparation agent: Skin preparation agent completely dried prior to procedure   Sedation:    Sedation type:  None Anesthesia:    Anesthesia method:  Local infiltration   Local anesthetic:  Lidocaine 1% w/o epi Procedure details:    Location:  L internal jugular   Patient position:  Trendelenburg   Procedural supplies:  Triple lumen   Landmarks identified: yes     Ultrasound guidance: yes     Ultrasound guidance timing: real time     Sterile ultrasound techniques: Sterile gel and sterile probe covers were used     Number of attempts:  1 Post-procedure details:    Post-procedure:  Dressing applied and line sutured   Assessment:  Blood return through all ports, free fluid flow, placement verified by x-ray and no pneumothorax on x-ray   Procedure completion:  Tolerated well, no immediate complications .Critical Care Performed by: Sherwood Gambler, MD Authorized by: Sherwood Gambler, MD   Critical care provider statement:    Critical care time (minutes):  80   Critical care time was exclusive of:  Separately billable procedures and treating other patients   Critical care was necessary to treat or prevent imminent or life-threatening  deterioration of the following conditions:  Circulatory failure, sepsis, shock and renal failure   Critical care was time spent personally by me on the following activities:  Development of treatment plan with patient or surrogate, discussions with consultants, evaluation of patient's response to treatment, examination of patient, ordering and review of laboratory studies, ordering and review of radiographic studies, ordering and performing treatments and interventions, pulse oximetry, re-evaluation of patient's condition and review of old charts   Medications Ordered in ED Medications  0.9 %  sodium chloride infusion (0 mLs  Intravenous Stopped 10/29/21 2251)  norepinephrine (LEVOPHED) 4mg  in 275mL (0.016 mg/mL) premix infusion (8 mcg/min Intravenous Rate/Dose Change 10/29/21 2300)  ceFEPIme (MAXIPIME) 1 g in sodium chloride 0.9 % 100 mL IVPB (has no administration in time range)  lactated ringers infusion ( Intravenous New Bag/Given 10/29/21 2250)  potassium chloride SA (KLOR-CON M) CR tablet 40 mEq (has no administration in time range)  potassium chloride 10 mEq in 50 mL *CENTRAL LINE* IVPB (has no administration in time range)  sodium chloride 0.9 % bolus 1,000 mL (0 mLs Intravenous Stopped 10/29/21 1852)  0.9 %  sodium chloride infusion (0 mL/hr Intravenous Stopped 10/29/21 1852)  lactated ringers bolus 800 mL (0 mLs Intravenous Stopped 10/29/21 1925)  ceFEPIme (MAXIPIME) 2 g in sodium chloride 0.9 % 100 mL IVPB (0 g Intravenous Stopped 10/29/21 1852)  metroNIDAZOLE (FLAGYL) IVPB 500 mg (0 mg Intravenous Stopped 10/29/21 2004)  vancomycin (VANCOCIN) IVPB 1000 mg/200 mL premix (0 mg Intravenous Stopped 10/29/21 2149)  vancomycin (VANCOCIN) IVPB 1000 mg/200 mL premix (0 mg Intravenous Stopped 10/29/21 2100)  potassium chloride SA (KLOR-CON M) CR tablet 40 mEq (40 mEq Oral Given 10/29/21 2200)  potassium chloride 10 mEq in 100 mL IVPB (0 mEq Intravenous Stopped 10/29/21 2109)  lactated ringers bolus 1,000 mL (0 mLs Intravenous Stopped 10/29/21 2026)  lidocaine-EPINEPHrine (XYLOCAINE W/EPI) 2 %-1:200000 (PF) injection 10 mL (10 mLs Infiltration Given 10/29/21 2109)    ED Course  I have reviewed the triage vital signs and the nursing notes.  Pertinent labs & imaging results that were available during my care of the patient were reviewed by me and considered in my medical decision making (see chart for details).  Clinical Course as of 10/29/21 2337  Thu Oct 29, 2021  1746 Patient has multiple blood pressures in the 60s.  She was already started on fluids but with concern for acute on chronic anemia we will give  emergency release blood.  She also seems altered and some not sure if bleeding is her only concern so we will also start sepsis work-up. [SG]  1800 Cousin is here, indicates her mental status is at baseline, has chronic mental status problems.  Otherwise, the nursing facility was apparently worried about an infection though they did not tell her where or why they were concerned about that.  She is had chronic breast wound/leakage for months.  She was recently on antibiotic but she did not know what.  We will continue work-up and treatment with broad antibiotics at this time. [SG]    Clinical Course User Index [SG] Sherwood Gambler, MD   MDM Rules/Calculators/A&P                         Patient initially presented as anemia from the facility but it was apparent shortly after arrival that she was sicker than that.  She was given a unit of emergency release blood due to the anemia though  there has been no apparent GI bleeding since arrival.  She is found to have severe renal failure with hyponatremia and severe hypokalemia.  She was given multiple liters of fluids, the 1 unit of blood, broad antibiotics, and ultimately had to be started on norepinephrine due to persistent mild hypotension.  Central line was placed as above for better access and for continued pressors.  We will try to give more concentrated potassium through this as well.  I did discuss with Dr. Ilda Mori of ICU at [facility] who indicates no real indication to transfer to [facility] and she could be treated up.  [place]st[place] Na.  Discussed with Dr. Royce Macadamia of nephrology who recommends against a bicarb drip due to this will make her potassium worse and to continue fluids, 40 more milliequivalents of IV potassium and 40 more milliequivalents of p.o.  Recheck BMP every 3 hours.  She will see in the morning.  No emergent dialysis needed tonight.  Hospitalist will admit to the ICU.    Final Clinical Impression(s) / ED Diagnoses Final diagnoses:  Shock  ([place])  Acute kidney injury ([place])  Hypokalemia  Anemia, unspecified type    Rx / DC Orders ED Discharge Orders     None        Sherwood Gambler, MD 10/29/21 2339

## 2021-10-29 NOTE — ED Notes (Addendum)
Pt moved to room 9. Pt placed on cardiac monitor with BP to set cycle every 30 minutes. Continuous pulse oximeter applied. Dr. Regenia Skeeter notified of bp.

## 2021-10-29 NOTE — ED Triage Notes (Signed)
Per [facility] H/H low 6.1

## 2021-10-29 NOTE — Sepsis Progress Note (Signed)
Code sepsis protocol being monitored by eLink. 

## 2021-10-30 ENCOUNTER — Inpatient Hospital Stay (HOSPITAL_COMMUNITY): Payer: Medicare Other

## 2021-10-30 DIAGNOSIS — J189 Pneumonia, unspecified organism: Secondary | ICD-10-CM | POA: Diagnosis present

## 2021-10-30 DIAGNOSIS — D631 Anemia in chronic kidney disease: Secondary | ICD-10-CM | POA: Diagnosis present

## 2021-10-30 DIAGNOSIS — E860 Dehydration: Secondary | ICD-10-CM | POA: Diagnosis not present

## 2021-10-30 DIAGNOSIS — R6521 Severe sepsis with septic shock: Secondary | ICD-10-CM | POA: Diagnosis present

## 2021-10-30 DIAGNOSIS — Z515 Encounter for palliative care: Secondary | ICD-10-CM | POA: Diagnosis not present

## 2021-10-30 DIAGNOSIS — N179 Acute kidney failure, unspecified: Secondary | ICD-10-CM | POA: Diagnosis not present

## 2021-10-30 DIAGNOSIS — E8809 Other disorders of plasma-protein metabolism, not elsewhere classified: Secondary | ICD-10-CM

## 2021-10-30 DIAGNOSIS — A419 Sepsis, unspecified organism: Principal | ICD-10-CM | POA: Diagnosis present

## 2021-10-30 DIAGNOSIS — R197 Diarrhea, unspecified: Secondary | ICD-10-CM | POA: Diagnosis present

## 2021-10-30 DIAGNOSIS — G9341 Metabolic encephalopathy: Secondary | ICD-10-CM | POA: Diagnosis present

## 2021-10-30 DIAGNOSIS — E119 Type 2 diabetes mellitus without complications: Secondary | ICD-10-CM

## 2021-10-30 DIAGNOSIS — E782 Mixed hyperlipidemia: Secondary | ICD-10-CM

## 2021-10-30 DIAGNOSIS — E8729 Other acidosis: Secondary | ICD-10-CM | POA: Diagnosis not present

## 2021-10-30 DIAGNOSIS — D649 Anemia, unspecified: Secondary | ICD-10-CM

## 2021-10-30 DIAGNOSIS — N189 Chronic kidney disease, unspecified: Secondary | ICD-10-CM

## 2021-10-30 DIAGNOSIS — E1122 Type 2 diabetes mellitus with diabetic chronic kidney disease: Secondary | ICD-10-CM | POA: Diagnosis present

## 2021-10-30 DIAGNOSIS — D509 Iron deficiency anemia, unspecified: Secondary | ICD-10-CM

## 2021-10-30 DIAGNOSIS — N170 Acute kidney failure with tubular necrosis: Secondary | ICD-10-CM | POA: Diagnosis present

## 2021-10-30 DIAGNOSIS — E66813 Obesity, class 3: Secondary | ICD-10-CM

## 2021-10-30 DIAGNOSIS — E871 Hypo-osmolality and hyponatremia: Secondary | ICD-10-CM

## 2021-10-30 DIAGNOSIS — Z7189 Other specified counseling: Secondary | ICD-10-CM | POA: Diagnosis not present

## 2021-10-30 DIAGNOSIS — Z6841 Body Mass Index (BMI) 40.0 and over, adult: Secondary | ICD-10-CM | POA: Diagnosis not present

## 2021-10-30 DIAGNOSIS — F17210 Nicotine dependence, cigarettes, uncomplicated: Secondary | ICD-10-CM | POA: Diagnosis present

## 2021-10-30 DIAGNOSIS — E46 Unspecified protein-calorie malnutrition: Secondary | ICD-10-CM

## 2021-10-30 DIAGNOSIS — N390 Urinary tract infection, site not specified: Secondary | ICD-10-CM | POA: Diagnosis present

## 2021-10-30 DIAGNOSIS — N184 Chronic kidney disease, stage 4 (severe): Secondary | ICD-10-CM | POA: Diagnosis present

## 2021-10-30 DIAGNOSIS — E43 Unspecified severe protein-calorie malnutrition: Secondary | ICD-10-CM | POA: Diagnosis present

## 2021-10-30 DIAGNOSIS — Z23 Encounter for immunization: Secondary | ICD-10-CM | POA: Diagnosis not present

## 2021-10-30 DIAGNOSIS — Z66 Do not resuscitate: Secondary | ICD-10-CM | POA: Diagnosis present

## 2021-10-30 DIAGNOSIS — D6959 Other secondary thrombocytopenia: Secondary | ICD-10-CM | POA: Diagnosis present

## 2021-10-30 DIAGNOSIS — R9431 Abnormal electrocardiogram [ECG] [EKG]: Secondary | ICD-10-CM

## 2021-10-30 DIAGNOSIS — E7800 Pure hypercholesterolemia, unspecified: Secondary | ICD-10-CM | POA: Diagnosis present

## 2021-10-30 DIAGNOSIS — Z6834 Body mass index (BMI) 34.0-34.9, adult: Secondary | ICD-10-CM | POA: Diagnosis not present

## 2021-10-30 DIAGNOSIS — M109 Gout, unspecified: Secondary | ICD-10-CM

## 2021-10-30 DIAGNOSIS — I129 Hypertensive chronic kidney disease with stage 1 through stage 4 chronic kidney disease, or unspecified chronic kidney disease: Secondary | ICD-10-CM | POA: Diagnosis present

## 2021-10-30 DIAGNOSIS — D72829 Elevated white blood cell count, unspecified: Secondary | ICD-10-CM

## 2021-10-30 DIAGNOSIS — D696 Thrombocytopenia, unspecified: Secondary | ICD-10-CM | POA: Diagnosis present

## 2021-10-30 DIAGNOSIS — E876 Hypokalemia: Secondary | ICD-10-CM | POA: Diagnosis present

## 2021-10-30 DIAGNOSIS — N1832 Chronic kidney disease, stage 3b: Secondary | ICD-10-CM | POA: Diagnosis present

## 2021-10-30 DIAGNOSIS — Q614 Renal dysplasia: Secondary | ICD-10-CM | POA: Diagnosis not present

## 2021-10-30 DIAGNOSIS — E872 Acidosis, unspecified: Secondary | ICD-10-CM | POA: Diagnosis present

## 2021-10-30 DIAGNOSIS — D7589 Other specified diseases of blood and blood-forming organs: Secondary | ICD-10-CM | POA: Diagnosis present

## 2021-10-30 DIAGNOSIS — Z20822 Contact with and (suspected) exposure to covid-19: Secondary | ICD-10-CM | POA: Diagnosis present

## 2021-10-30 LAB — COMPREHENSIVE METABOLIC PANEL
ALT: 10 U/L (ref 0–44)
AST: 19 U/L (ref 15–41)
Albumin: 1.9 g/dL — ABNORMAL LOW (ref 3.5–5.0)
Alkaline Phosphatase: 86 U/L (ref 38–126)
Anion gap: 16 — ABNORMAL HIGH (ref 5–15)
BUN: 172 mg/dL — ABNORMAL HIGH (ref 6–20)
CO2: 12 mmol/L — ABNORMAL LOW (ref 22–32)
Calcium: 7.9 mg/dL — ABNORMAL LOW (ref 8.9–10.3)
Chloride: 100 mmol/L (ref 98–111)
Creatinine, Ser: 10.09 mg/dL — ABNORMAL HIGH (ref 0.44–1.00)
GFR, Estimated: 4 mL/min — ABNORMAL LOW (ref >60)
Glucose, Bld: 184 mg/dL — ABNORMAL HIGH (ref 70–99)
Potassium: <2 mmol/L — CL (ref 3.5–5.1)
Sodium: 128 mmol/L — ABNORMAL LOW (ref 135–145)
Total Bilirubin: 0.8 mg/dL (ref 0.3–1.2)
Total Protein: 5.2 g/dL — ABNORMAL LOW (ref 6.5–8.1)

## 2021-10-30 LAB — CBC
HCT: 21.5 % — ABNORMAL LOW (ref 36.0–46.0)
Hemoglobin: 7.4 g/dL — ABNORMAL LOW (ref 12.0–15.0)
MCH: 27 pg (ref 26.0–34.0)
MCHC: 34.4 g/dL (ref 30.0–36.0)
MCV: 78.5 fL — ABNORMAL LOW (ref 80.0–100.0)
Platelets: 239 10*3/uL (ref 150–400)
RBC: 2.74 MIL/uL — ABNORMAL LOW (ref 3.87–5.11)
RDW: 18.6 % — ABNORMAL HIGH (ref 11.5–15.5)
WBC: 24.1 10*3/uL — ABNORMAL HIGH (ref 4.0–10.5)
nRBC: 0 % (ref 0.0–0.2)

## 2021-10-30 LAB — IRON AND TIBC
Iron: 81 ug/dL (ref 28–170)
Saturation Ratios: 71 % — ABNORMAL HIGH (ref 10.4–31.8)
TIBC: 115 ug/dL — ABNORMAL LOW (ref 250–450)
UIBC: 34 ug/dL

## 2021-10-30 LAB — TYPE AND SCREEN
ABO/RH(D): A POS
Antibody Screen: NEGATIVE
Unit division: 0

## 2021-10-30 LAB — C DIFFICILE QUICK SCREEN W PCR REFLEX
C Diff antigen: NEGATIVE
C Diff interpretation: NOT DETECTED
C Diff toxin: NEGATIVE

## 2021-10-30 LAB — GLUCOSE, CAPILLARY
Glucose-Capillary: 159 mg/dL — ABNORMAL HIGH (ref 70–99)
Glucose-Capillary: 140 mg/dL — ABNORMAL HIGH (ref 70–99)
Glucose-Capillary: 136 mg/dL — ABNORMAL HIGH (ref 70–99)

## 2021-10-30 LAB — BASIC METABOLIC PANEL
Anion gap: 16 — ABNORMAL HIGH (ref 5–15)
Anion gap: 14 (ref 5–15)
Anion gap: 14 (ref 5–15)
Anion gap: 14 (ref 5–15)
Anion gap: 14 (ref 5–15)
BUN: 172 mg/dL — ABNORMAL HIGH (ref 6–20)
BUN: 172 mg/dL — ABNORMAL HIGH (ref 6–20)
BUN: 166 mg/dL — ABNORMAL HIGH (ref 6–20)
BUN: 162 mg/dL — ABNORMAL HIGH (ref 6–20)
BUN: 159 mg/dL — ABNORMAL HIGH (ref 6–20)
CO2: 12 mmol/L — ABNORMAL LOW (ref 22–32)
CO2: 12 mmol/L — ABNORMAL LOW (ref 22–32)
CO2: 12 mmol/L — ABNORMAL LOW (ref 22–32)
CO2: 11 mmol/L — ABNORMAL LOW (ref 22–32)
CO2: 11 mmol/L — ABNORMAL LOW (ref 22–32)
Calcium: 7.9 mg/dL — ABNORMAL LOW (ref 8.9–10.3)
Calcium: 7.6 mg/dL — ABNORMAL LOW (ref 8.9–10.3)
Calcium: 7.7 mg/dL — ABNORMAL LOW (ref 8.9–10.3)
Calcium: 7.6 mg/dL — ABNORMAL LOW (ref 8.9–10.3)
Calcium: 7.6 mg/dL — ABNORMAL LOW (ref 8.9–10.3)
Chloride: 100 mmol/L (ref 98–111)
Chloride: 100 mmol/L (ref 98–111)
Chloride: 101 mmol/L (ref 98–111)
Chloride: 102 mmol/L (ref 98–111)
Chloride: 103 mmol/L (ref 98–111)
Creatinine, Ser: 10.11 mg/dL — ABNORMAL HIGH (ref 0.44–1.00)
Creatinine, Ser: 10.12 mg/dL — ABNORMAL HIGH (ref 0.44–1.00)
Creatinine, Ser: 9.58 mg/dL — ABNORMAL HIGH (ref 0.44–1.00)
Creatinine, Ser: 9.22 mg/dL — ABNORMAL HIGH (ref 0.44–1.00)
Creatinine, Ser: 9.68 mg/dL — ABNORMAL HIGH (ref 0.44–1.00)
GFR, Estimated: 4 mL/min — ABNORMAL LOW (ref >60)
GFR, Estimated: 4 mL/min — ABNORMAL LOW (ref >60)
GFR, Estimated: 4 mL/min — ABNORMAL LOW (ref >60)
GFR, Estimated: 5 mL/min — ABNORMAL LOW (ref >60)
GFR, Estimated: 4 mL/min — ABNORMAL LOW (ref >60)
Glucose, Bld: 184 mg/dL — ABNORMAL HIGH (ref 70–99)
Glucose, Bld: 182 mg/dL — ABNORMAL HIGH (ref 70–99)
Glucose, Bld: 175 mg/dL — ABNORMAL HIGH (ref 70–99)
Glucose, Bld: 168 mg/dL — ABNORMAL HIGH (ref 70–99)
Glucose, Bld: 177 mg/dL — ABNORMAL HIGH (ref 70–99)
Potassium: <2 mmol/L — CL (ref 3.5–5.1)
Potassium: <2 mmol/L — CL (ref 3.5–5.1)
Potassium: 2.4 mmol/L — CL (ref 3.5–5.1)
Potassium: 2.5 mmol/L — CL (ref 3.5–5.1)
Potassium: 2.2 mmol/L — CL (ref 3.5–5.1)
Sodium: 128 mmol/L — ABNORMAL LOW (ref 135–145)
Sodium: 126 mmol/L — ABNORMAL LOW (ref 135–145)
Sodium: 127 mmol/L — ABNORMAL LOW (ref 135–145)
Sodium: 127 mmol/L — ABNORMAL LOW (ref 135–145)
Sodium: 128 mmol/L — ABNORMAL LOW (ref 135–145)

## 2021-10-30 LAB — RENAL FUNCTION PANEL
Albumin: 1.8 g/dL — ABNORMAL LOW (ref 3.5–5.0)
Anion gap: 14 (ref 5–15)
BUN: 85 mg/dL — ABNORMAL HIGH (ref 6–20)
CO2: 16 mmol/L — ABNORMAL LOW (ref 22–32)
Calcium: 7.1 mg/dL — ABNORMAL LOW (ref 8.9–10.3)
Chloride: 100 mmol/L (ref 98–111)
Creatinine, Ser: 5.6 mg/dL — ABNORMAL HIGH (ref 0.44–1.00)
GFR, Estimated: 9 mL/min — ABNORMAL LOW (ref >60)
Glucose, Bld: 159 mg/dL — ABNORMAL HIGH (ref 70–99)
Phosphorus: 1.3 mg/dL — ABNORMAL LOW (ref 2.5–4.6)
Potassium: 2.7 mmol/L — CL (ref 3.5–5.1)
Sodium: 130 mmol/L — ABNORMAL LOW (ref 135–145)

## 2021-10-30 LAB — BPAM RBC
Blood Product Expiration Date: 202301162359
ISSUE DATE / TIME: 202212151750
Unit Type and Rh: 9500

## 2021-10-30 LAB — HEMOGLOBIN A1C
Hgb A1c MFr Bld: 5.2 % (ref 4.8–5.6)
Mean Plasma Glucose: 102.54 mg/dL

## 2021-10-30 LAB — PHOSPHORUS
Phosphorus: 3.3 mg/dL (ref 2.5–4.6)
Phosphorus: 2.9 mg/dL (ref 2.5–4.6)

## 2021-10-30 LAB — MRSA NEXT GEN BY PCR, NASAL: MRSA by PCR Next Gen: DETECTED — AB

## 2021-10-30 LAB — FERRITIN: Ferritin: 1080 ng/mL — ABNORMAL HIGH (ref 11–307)

## 2021-10-30 LAB — PROCALCITONIN: Procalcitonin: 0.66 ng/mL

## 2021-10-30 LAB — HEPATITIS B SURFACE ANTIBODY,QUALITATIVE: Hep B S Ab: REACTIVE — AB

## 2021-10-30 LAB — ALBUMIN: Albumin: 1.8 g/dL — ABNORMAL LOW (ref 3.5–5.0)

## 2021-10-30 LAB — HEPATITIS B SURFACE ANTIGEN: Hepatitis B Surface Ag: NONREACTIVE

## 2021-10-30 LAB — APTT: aPTT: 63 seconds — ABNORMAL HIGH (ref 24–36)

## 2021-10-30 MED ORDER — SODIUM CHLORIDE 0.9 % IV SOLN: 100.0000 mL | INTRAVENOUS | Status: DC | PRN

## 2021-10-30 MED ORDER — INSULIN ASPART 100 UNIT/ML IJ SOLN
0.0000 [IU] | Freq: Three times a day (TID) | INTRAMUSCULAR | Status: DC
  Administered 2021-10-30 – 2021-10-31 (×2): 1 [IU] via SUBCUTANEOUS
  Administered 2021-11-01: 08:00:00 2 [IU] via SUBCUTANEOUS
  Administered 2021-11-01 – 2021-11-02 (×4): 1 [IU] via SUBCUTANEOUS
  Administered 2021-11-03: 12:00:00 2 [IU] via SUBCUTANEOUS
  Administered 2021-11-03: 09:00:00 1 [IU] via SUBCUTANEOUS
  Administered 2021-11-04: 12:00:00 2 [IU] via SUBCUTANEOUS
  Administered 2021-11-05: 17:00:00 1 [IU] via SUBCUTANEOUS
  Administered 2021-11-05 – 2021-11-06 (×3): 2 [IU] via SUBCUTANEOUS
  Administered 2021-11-06: 12:00:00 1 [IU] via SUBCUTANEOUS
  Administered 2021-11-07 (×3): 2 [IU] via SUBCUTANEOUS
  Administered 2021-11-08 (×2): 1 [IU] via SUBCUTANEOUS

## 2021-10-30 MED ORDER — POTASSIUM CHLORIDE 10 MEQ/100ML IV SOLN
10.0000 meq | INTRAVENOUS | Status: AC
  Administered 2021-10-30 (×3): 10 meq via INTRAVENOUS
  Filled 2021-10-30 (×2): qty 100

## 2021-10-30 MED ORDER — HEPARIN SODIUM (PORCINE) 1000 UNIT/ML DIALYSIS
1000.0000 [IU] | INTRAMUSCULAR | Status: DC | PRN
  Administered 2021-10-30: 2600 [IU] via INTRAVENOUS_CENTRAL
  Filled 2021-10-30 (×8): qty 1

## 2021-10-30 MED ORDER — POTASSIUM CHLORIDE 10 MEQ/100ML IV SOLN
10.0000 meq | INTRAVENOUS | Status: AC
  Administered 2021-10-31 (×4): 10 meq via INTRAVENOUS
  Filled 2021-10-30 (×2): qty 100

## 2021-10-30 MED ORDER — POTASSIUM CHLORIDE 20 MEQ PO PACK
40.0000 meq | PACK | ORAL | Status: AC
  Administered 2021-10-30 (×2): 40 meq via ORAL
  Filled 2021-10-30 (×3): qty 2

## 2021-10-30 MED ORDER — CHLORHEXIDINE GLUCONATE CLOTH 2 % EX PADS
6.0000 | MEDICATED_PAD | Freq: Every day | CUTANEOUS | Status: DC
  Administered 2021-10-30 – 2021-11-08 (×9): 6 via TOPICAL

## 2021-10-30 MED ORDER — INSULIN ASPART 100 UNIT/ML IJ SOLN: 0.0000 [IU] | Freq: Every day | INTRAMUSCULAR | Status: DC

## 2021-10-30 MED ORDER — POTASSIUM CHLORIDE 10 MEQ/100ML IV SOLN
10.0000 meq | INTRAVENOUS | Status: AC
  Administered 2021-10-30 (×6): 10 meq via INTRAVENOUS
  Filled 2021-10-30 (×6): qty 100

## 2021-10-30 MED ORDER — ENOXAPARIN SODIUM 30 MG/0.3ML IJ SOSY
30.0000 mg | PREFILLED_SYRINGE | INTRAMUSCULAR | Status: DC
  Administered 2021-10-30 – 2021-11-07 (×9): 30 mg via SUBCUTANEOUS
  Filled 2021-10-30 (×9): qty 0.3

## 2021-10-30 MED ORDER — CHLORHEXIDINE GLUCONATE CLOTH 2 % EX PADS
6.0000 | MEDICATED_PAD | Freq: Every day | CUTANEOUS | Status: DC
  Administered 2021-10-30 – 2021-11-04 (×5): 6 via TOPICAL

## 2021-10-30 MED ORDER — CHLORHEXIDINE GLUCONATE CLOTH 2 % EX PADS
6.0000 | MEDICATED_PAD | Freq: Every day | CUTANEOUS | Status: DC
  Administered 2021-10-30 – 2021-11-04 (×6): 6 via TOPICAL

## 2021-10-30 MED ORDER — POTASSIUM CHLORIDE CRYS ER 20 MEQ PO TBCR
40.0000 meq | EXTENDED_RELEASE_TABLET | ORAL | Status: AC
  Filled 2021-10-30: qty 2

## 2021-10-30 MED ORDER — POTASSIUM CHLORIDE 10 MEQ/100ML IV SOLN: 10.0000 meq | INTRAVENOUS | Status: DC

## 2021-10-30 MED ORDER — POTASSIUM CHLORIDE CRYS ER 20 MEQ PO TBCR
40.0000 meq | EXTENDED_RELEASE_TABLET | Freq: Once | ORAL | Status: DC
  Filled 2021-10-30: qty 2

## 2021-10-30 MED ORDER — ALTEPLASE 2 MG IJ SOLR: 2.0000 mg | Freq: Once | INTRAMUSCULAR | Status: DC | PRN

## 2021-10-30 MED ORDER — PROCHLORPERAZINE EDISYLATE 10 MG/2ML IJ SOLN
5.0000 mg | Freq: Four times a day (QID) | INTRAMUSCULAR | Status: DC | PRN
  Administered 2021-10-30 – 2021-11-05 (×3): 5 mg via INTRAVENOUS
  Filled 2021-10-30 (×3): qty 2

## 2021-10-30 MED ORDER — VANCOMYCIN HCL 750 MG/150ML IV SOLN
750.0000 mg | INTRAVENOUS | Status: AC
  Administered 2021-10-30: 750 mg via INTRAVENOUS
  Filled 2021-10-30: qty 150

## 2021-10-30 MED ORDER — POTASSIUM CHLORIDE 10 MEQ/100ML IV SOLN
10.0000 meq | INTRAVENOUS | Status: DC
  Administered 2021-10-30: 10 meq via INTRAVENOUS

## 2021-10-30 MED ORDER — HEPARIN SODIUM (PORCINE) 1000 UNIT/ML IJ SOLN
4000.0000 [IU] | Freq: Once | INTRAMUSCULAR | Status: AC
  Administered 2021-10-30: 4000 [IU]
  Filled 2021-10-30: qty 4

## 2021-10-30 MED ORDER — MAGNESIUM SULFATE 4 GM/100ML IV SOLN
4.0000 g | Freq: Once | INTRAVENOUS | Status: DC
Start: 2021-10-30 — End: 2021-10-30

## 2021-10-30 MED ORDER — POTASSIUM CHLORIDE IN NACL 40-0.9 MEQ/L-% IV SOLN
INTRAVENOUS | Status: DC
  Administered 2021-10-30: 100 mL/h via INTRAVENOUS

## 2021-10-30 MED ORDER — MUPIROCIN 2 % EX OINT
TOPICAL_OINTMENT | Freq: Two times a day (BID) | CUTANEOUS | Status: DC
  Administered 2021-10-30 – 2021-11-06 (×5): 1 via NASAL
  Filled 2021-10-30 (×2): qty 22

## 2021-10-30 MED ORDER — VANCOMYCIN VARIABLE DOSE PER UNSTABLE RENAL FUNCTION (PHARMACIST DOSING): Status: DC

## 2021-10-30 MED ORDER — LIDOCAINE HCL (PF) 1 % IJ SOLN
INTRAMUSCULAR | Status: AC
  Administered 2021-10-30: 5 mL
  Filled 2021-10-30: qty 5

## 2021-10-30 MED ORDER — NOREPINEPHRINE 4 MG/250ML-% IV SOLN
0.0000 ug/min | INTRAVENOUS | Status: DC
  Administered 2021-10-30 (×4): 12 ug/min via INTRAVENOUS
  Administered 2021-10-31: 07:00:00 11 ug/min via INTRAVENOUS
  Administered 2021-10-31: 10 ug/min via INTRAVENOUS
  Administered 2021-10-31: 12 ug/min via INTRAVENOUS
  Administered 2021-10-31: 15:00:00 11 ug/min via INTRAVENOUS
  Administered 2021-11-01 (×4): 14 ug/min via INTRAVENOUS
  Administered 2021-11-02: 19:00:00 7 ug/min via INTRAVENOUS
  Administered 2021-11-02: 04:00:00 13 ug/min via INTRAVENOUS
  Administered 2021-11-02: 11:00:00 9 ug/min via INTRAVENOUS
  Administered 2021-11-03: 05:00:00 7 ug/min via INTRAVENOUS
  Administered 2021-11-03: 16:00:00 6 ug/min via INTRAVENOUS
  Administered 2021-11-04: 21:00:00 5 ug/min via INTRAVENOUS
  Administered 2021-11-04: 08:00:00 6 ug/min via INTRAVENOUS
  Filled 2021-10-30 (×21): qty 250

## 2021-10-30 MED ORDER — PENTAFLUOROPROP-TETRAFLUOROETH EX AERO: 1.0000 "application " | INHALATION_SPRAY | CUTANEOUS | Status: DC | PRN

## 2021-10-30 NOTE — Progress Notes (Signed)
Patient's aunt reported that the patient takes PO K+ in liquid form & isn't able to take the TABs, MD made aware & K+ changed to liquid

## 2021-10-30 NOTE — Progress Notes (Signed)
Critical lab of potassium 2.7 called on the patient, MD paged to notify.

## 2021-10-30 NOTE — Progress Notes (Signed)
[facility]  CXR with catheter in good position and no ptx. Official read pending. Ok to use. Discussed with Dr. Royce Macadamia, if needs tunneled line can do Monday afternoon. Would need to be NPO midnight Sunday and send me a text so I can post case.   Curlene Labrum, MD [facility] [address] [place], [place] [id number] [phone] (office)

## 2021-10-30 NOTE — Progress Notes (Signed)
Patient refused NG tube

## 2021-10-30 NOTE — Procedures (Signed)
Procedure Note  10/30/21    Preoperative Diagnosis: Acute kidney injury    Postoperative Diagnosis: Same   Procedure(s) Performed: Central Line placement, right internal jugular dialysis catheter   Surgeon: Lanell Matar. Constance Haw, MD   Assistants: None   Anesthesia: 1% lidocaine    Complications: None    Indications: Courtney Dyer is a 55 y.o. with AKI . I discussed the risk and benefits of placement of the central line with her aunt who is making her decisions , including but not limited to bleeding, infection, and risk of pneumothorax. She has given consent for the procedure.    Procedure: The patient placed supine. The right chest and neck was prepped and draped in the usual sterile fashion.  Wearing full gown and gloves, I performed the procedure.  One percent lidocaine was used for local anesthesia. An ultrasound was utilized to assess the jugular vein.  The needle with syringe was advanced into the vein with dark venous return, and a wire was placed using the Seldinger technique without difficulty.  Ectopia was noted briefly and resolved.  The skin was knicked and a dilator was placed, and the three lumen trialysis catheter (12.5 F, 16cm) was placed over the wire with continued control of the wire.  There was good draw back of blood from all three lumens and each flushed easily with saline.  The catheter was secured in 2 points with 2-0 silk and a biopatch and dressing was placed.   Heparin flush was packed in the lumens.   The patient tolerated the procedure well, and the CXR was ordered to confirm position of the central line.   Curlene Labrum, MD [facility] [address] [place], [place] [address] [phone] (office)

## 2021-10-30 NOTE — TOC Initial Note (Signed)
Transition of Care [facility]) - Initial/Assessment Note    Patient Details  Name: Courtney Dyer MRN: [id number] Date of Birth: [birth year]  Transition of [facility]) CM/SW Contact:    Boneta Lucks, RN Phone Number: 10/30/2021, 11:27 AM  Clinical Narrative:    Patient admitted with sepsis secondary to UTI. Patient has been at [facility] for rehab. TOC spoke with her aunt, she is at the bedside, she was consented for dialysis. Hoyle Sauer has been working with social work at [facility]. Patient wants to stay there long term. Palliative has been consulted. TOC to follow.               Expected Discharge Plan: [place] Barriers to Discharge: Continued Medical Work up   Patient Goals and CMS Choice Patient states their goals for this hospitalization and ongoing recovery are:: To return to [facility].gov Compare Post Acute Care list provided to:: Patient Represenative (must comment) Choice offered to / list presented to : [place] / Guardian  Expected Discharge Plan and Services Expected Discharge Plan: [place]     Living arrangements for the past 2 months: [facility]                   Prior Living Arrangements/Services Living arrangements for the past 2 months: [place] Lives with:: Facility Resident     Activities of Daily Living     Permission Sought/Granted    Emotional Assessment     Admission diagnosis:  Hypokalemia [E87.6] Shock ([place]) [R57.9] Acute kidney injury ([place]) [N17.9] Sepsis secondary to UTI ([place]) [A41.9, N39.0] Anemia, unspecified type [D64.9] Patient Active Problem List   Diagnosis Date Noted   Sepsis secondary to UTI ([place]) 10/30/2021   Hyponatremia 10/30/2021   High anion gap metabolic acidosis 79/12/4095   Dehydration 10/30/2021   Leukocytosis 10/30/2021   Microcytic anemia 10/30/2021   Hypoalbuminemia due to protein-calorie malnutrition ([place]) 35/32/9924   Acute metabolic encephalopathy  26/83/4196   Mixed hyperlipidemia 10/30/2021   Type 2 diabetes mellitus ([place]) 10/30/2021   Gout 10/30/2021   Prolonged QT interval 10/30/2021   Obesity, Class III, BMI 40-49.9 (morbid obesity) ([place]) 10/30/2021   Pressure injury of skin 09/01/2021   ABLA (acute blood loss anemia)    Gastrointestinal hemorrhage    Intractable nausea and vomiting 08/26/2021   Hypocalcemia 08/26/2021   Generalized weakness 08/26/2021   Alcohol abuse 08/26/2021   Acute urinary retention 08/26/2021   CKD (chronic kidney disease) stage 3, GFR 30-59 ml/min ([place]) 08/08/2021   Candidal intertrigo 08/06/2021   Adrenal mass ([place]) 08/06/2021   Intractable vomiting    Acute kidney injury superimposed on CKD ([place]) 04/23/2021   Hypokalemia 04/23/2021   Symptomatic anemia 04/23/2021   Hypomagnesemia 04/23/2021   Bilateral impacted cerumen 12/24/2020   Cholesteatoma of external auditory canal, 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   Otitis externa of left ear 12/24/2020   High cholesterol    PCP:  Pcp, No Pharmacy:   [place], [place] [address] Phone: [phone] Fax: [place], [place] - [id number] [place] [address] Phone: [phone] Fax: [phone]   Readmission Risk Interventions Readmission Risk Prevention Plan 10/30/2021 08/27/2021 04/27/2021  Medication Screening - - Complete  Transportation Screening Complete Complete Complete  Home Care Screening - Complete -  Medication Review (RN CM) - Complete -  Medication Review (RN Care Manager) Complete - -  PCP or Specialist appointment within 3-5 days of discharge Complete - -  [place] or Home Care Consult Complete - -  SW Recovery Care/Counseling Consult Complete - -  Palliative Care Screening Not Complete - -  Skilled Nursing Facility Complete - -  Some recent data might be hidden

## 2021-10-30 NOTE — Consult Note (Addendum)
[facility] Renal Consultation Note  Requesting MD: Sherwood Gambler, MD Indication for Consultation:  AKI, hypokalemia  Chief complaint: sent for anemia  HPI:  Courtney Dyer is a 55 y.o. female with a history of diabetes, gout, and prior nephrectomy who presented to the hospital when anemia her blood work at her facility.  She has been having diarrhea and vomiting for several weeks to months per the patient's cousin per charting.  Additional work-up was performed and there Forestine Na, ER and the patient was found to be hypotensive as well as with acute renal failure and profound hypokalemia.  She has been on Levophed and is currently at 12 mcg/min - her RN is going to see if we can wean her down.  Nephrology is consulted for assistance with management.  At her baseline, the patient "does not say much."  She had a Foley catheter placed in the ER.  No urine output is recorded (however she had at least 350 mL in foley on my exam).  She has been in the ICU.  She had CT a/p with dysplastic right kidney and no stones or hydronephrosis on the left.  I contacted her aunt but did not get an answer.  Per nursing several family members have called about the patient but her aunt is listed as the patient contact.   Creatinine, Ser  Date/Time Value Ref Range Status  10/30/2021 07:17 AM 10.12 (H) 0.44 - 1.00 mg/dL Final  10/30/2021 04:23 AM 10.11 (H) 0.44 - 1.00 mg/dL Final  10/30/2021 04:23 AM 10.09 (H) 0.44 - 1.00 mg/dL Final  10/29/2021 09:19 PM 10.60 (H) 0.44 - 1.00 mg/dL Final  10/29/2021 05:00 PM 12.87 (H) 0.44 - 1.00 mg/dL Final  08/31/2021 09:50 AM 1.80 (H) 0.44 - 1.00 mg/dL Final  08/29/2021 04:47 AM 1.72 (H) 0.44 - 1.00 mg/dL Final  08/28/2021 06:37 AM 1.75 (H) 0.44 - 1.00 mg/dL Final  08/27/2021 06:10 AM 1.92 (H) 0.44 - 1.00 mg/dL Final  08/26/2021 03:03 PM 2.11 (H) 0.44 - 1.00 mg/dL Final  08/09/2021 06:19 AM 2.31 (H) 0.44 - 1.00 mg/dL Final  08/08/2021 06:47 AM 2.75 (H) 0.44  - 1.00 mg/dL Final    Comment:    DELTA CHECK NOTED  08/07/2021 05:59 AM 3.79 (H) 0.44 - 1.00 mg/dL Final  08/06/2021 12:04 PM 5.30 (H) 0.44 - 1.00 mg/dL Final  04/27/2021 04:32 AM 1.79 (H) 0.44 - 1.00 mg/dL Final  04/25/2021 04:18 AM 2.05 (H) 0.44 - 1.00 mg/dL Final  04/24/2021 05:09 AM 2.22 (H) 0.44 - 1.00 mg/dL Final  04/23/2021 08:16 PM 2.42 (H) 0.44 - 1.00 mg/dL Final  11/26/2015 08:20 PM 1.17 (H) 0.44 - 1.00 mg/dL Final  04/08/2011 10:57 AM 1.11 0.4 - 1.2 mg/dL Final  09/15/2009 10:13 PM 1.22 (H) 0.4 - 1.2 mg/dL Final  12/01/2007 02:24 PM 1.22 (H)  Final     PMHx:   Past Medical History:  Diagnosis Date   Diabetes mellitus    Gout    High cholesterol     Past Surgical History:  Procedure Laterality Date   BIOPSY  08/28/2021   Procedure: BIOPSY;  Surgeon: Harvel Quale, MD;  [facility]: [facility];  Service: Gastroenterology;;   BREAST SURGERY     ESOPHAGOGASTRODUODENOSCOPY (EGD) WITH PROPOFOL N/A 08/28/2021   Procedure: ESOPHAGOGASTRODUODENOSCOPY (EGD) WITH PROPOFOL;  Surgeon: Harvel Quale, MD;  Location: [facility];  Service: Gastroenterology;  Laterality: N/A;   NEPHRECTOMY      Family Hx:  Family History  Problem Relation Age of Onset   Hypertension Maternal Aunt     Social History:  reports that she has been smoking cigarettes. She has been smoking an average of .5 packs per day. She has never used smokeless tobacco. She reports current alcohol use. She reports that she does not use drugs.  Allergies: No Known Allergies  Medications: Prior to Admission medications   Medication Sig Start Date End Date Taking? Authorizing Provider  folic acid (FOLVITE) 1 MG tablet Take 1 tablet (1 mg total) by mouth daily. 08/10/21  Yes Tat, Shanon Brow, MD  magnesium oxide (MAG-OX) 400 (240 Mg) MG tablet Take 1 tablet (400 mg total) by mouth daily. 08/09/21  Yes Tat, Shanon Brow, MD  Multiple Vitamin (MULTIVITAMIN WITH MINERALS) TABS tablet Take 1 tablet by  mouth daily. 09/02/21  Yes Johnson, Clanford L, MD  ondansetron (ZOFRAN) 4 MG tablet Take 1 tablet (4 mg total) by mouth every 6 (six) hours as needed for nausea or vomiting. 09/01/21  Yes Johnson, Clanford L, MD  pantoprazole (PROTONIX) 40 MG tablet Take 1 tablet (40 mg total) by mouth 2 (two) times daily before a meal. 09/01/21 11/30/21 Yes Johnson, Clanford L, MD  sucralfate (CARAFATE) 1 g tablet Take 1 tablet (1 g total) by mouth 4 (four) times daily -  with meals and at bedtime for 14 days. 09/01/21 10/29/21 Yes Johnson, Clanford L, MD  thiamine 100 MG tablet Take 1 tablet (100 mg total) by mouth daily. 09/02/21  Yes Johnson, Clanford L, MD  traMADol (ULTRAM) 50 MG tablet Take 50 mg by mouth every 6 (six) hours as needed.   Yes [provider]  Nutritional Supplements (,FEEDING SUPPLEMENT, PROSOURCE PLUS) liquid Take 30 mLs by mouth 3 (three) times daily between meals. 09/01/21   Johnson, Clanford L, MD  nystatin (MYCOSTATIN/NYSTOP) powder Apply topically 3 (three) times daily. 09/01/21   Murlean Iba, MD    I have reviewed the patient's current and reported prior to admission medications.  Labs:  BMP Latest Ref Rng & Units 10/30/2021 10/30/2021 10/30/2021  Glucose 70 - 99 mg/dL 182(H) 184(H) 184(H)  BUN 6 - 20 mg/dL 172(H) 172(H) 172(H)  Creatinine 0.44 - 1.00 mg/dL 10.12(H) 10.11(H) 10.09(H)  Sodium 135 - 145 mmol/L 126(L) 128(L) 128(L)  Potassium 3.5 - 5.1 mmol/L <2.0(LL) <2.0(LL) <2.0(LL)  Chloride 98 - 111 mmol/L 100 100 100  CO2 22 - 32 mmol/L 12(L) 12(L) 12(L)  Calcium 8.9 - 10.3 mg/dL 7.6(L) 7.9(L) 7.9(L)    Urinalysis    Component Value Date/Time   COLORURINE GREEN (A) 10/29/2021 2005   APPEARANCEUR CLOUDY (A) 10/29/2021 2005   LABSPEC 1.020 10/29/2021 2005   PHURINE 6.0 10/29/2021 2005   GLUCOSEU NEGATIVE 10/29/2021 2005   HGBUR SMALL (A) 10/29/2021 2005   [place] NEGATIVE 10/29/2021 2005   KETONESUR 20 (A) 10/29/2021 2005   PROTEINUR 100 (A)  10/29/2021 2005   NITRITE POSITIVE (A) 10/29/2021 2005   LEUKOCYTESUR LARGE (A) 10/29/2021 2005     ROS:  Unable to obtain secondary to altered mental status   Physical Exam: Vitals:   10/30/21 0228 10/30/21 0800  BP: (!) 74/48   Pulse: 74   Resp: (!) 23   Temp: 97.9 F (36.6 C) (!) 97.1 F (36.2 C)  SpO2: 100%      General: adult female in bed minimally responsive HEENT: NCAT Eyes: sclera anicteric Neck: supple trachea midline Heart: S1S2 no rub Lungs: clear to auscultation  Abdomen: soft/NT/ND Extremities: no pitting edema; no cyanosis or  clubbing Skin: no rash on extremities exposed to me Neuro: patient is nonverbal; does not follow commands GU foley catheter in place with yellow urine - 350 ml + UOP  Assessment/Plan:  # Hypokalemia - Replete p.o. and IV as currently ordered.  Appreciate RN getting an NG tube - Thus far repletion has predominantly been IV and we have not really made any headway.  Have recommended p.o. as well.  She has had trouble with nausea and vomiting which complicates. Magnesium is replete  - Continue LR - HD with 4 K bath once we are able to get access - continue BMP every 3 hours for now  # AKI - Concern for prerenal insults given prolonged recent n/v and ischemic insults/ATN with shock . Intake UA with 100 mg/dL protein and 0-5 RBC - Continue Foley catheter - Continue LR - Consulting surgery for nontunneled catheter for HD - Have reached out to her aunt and will try here again.  At this point emergent consent  - Will perform HD with 4K bath; plan for HD on 12/17 as well  - Given constellation of symptoms will send SPEP, UPEP and free light chains.  The UPEP is listed as misc send out lab - UPEP (random urine) - code [id number]  # Septic shock - UTI.  Antibiotics per primary team  # Metabolic acidosis - Would caution against a bicarb drip as this would worsen her hypokalemia  # Altered mental status metabolic encephalopathy -Supportive  care per primary team and optimize renal failure  # CKD stage IV - Creatinine trends as above  # Symptomatic anemia - Transfusions per primary team - work up plasma cell dyscrasia as above  # Hyponatremia  - Follow with hydration for now  Nephrology to see over the weekend.  Please reach out with questions    Claudia Desanctis 10/30/2021, 10:42 AM   Addendum:  I spoke with the patient's aunt, Courtney Dyer, who is her Media planner.  We discussed the risks/benefits/indications for renal replacement therapy and she provided consent for renal replacement therapy.    Her cousin is also at bedside.  They report she is not herself but did recognize them this AM.  She has had n/v/d for a month.  Likely source of potassium loss   Claudia Desanctis, MD 11:29 AM 10/30/2021

## 2021-10-30 NOTE — Procedures (Signed)
° °  INITIAL HEMODIALYSIS TREATMENT NOTE:   Uneventful first ever heparin-free dialysis session completed using newly placed right IJ temporary catheter.  High potassium bath was utilized and NO fluid removed this session, as per Dr. Luis Abed order. All blood was returned.  Will dialyze again tomorrow.    Rockwell Alexandria, RN

## 2021-10-30 NOTE — Progress Notes (Signed)
Patient doesn't arouse enough to take PO meds safely, MD notified

## 2021-10-30 NOTE — Progress Notes (Signed)
PROGRESS NOTE     Courtney Dyer, is a 55 y.o. female, DOB - 20-Mar-1966, UDJ:[id number]  Admit date - 10/29/2021   Admitting Physician Bernadette Hoit, DO  Outpatient Primary MD for the patient is Pcp, No  LOS - 0  No chief complaint on file.     Altered mentation  Brief Narrative:  55 y.o. female with medical history significant for hyperlipidemia, T2DM, gout, GERD , HTN, CKD IV admitted on 10/30/2021 with AKI on CKD stage IV, severe electrolyte derangement, and persistent  severe dehydration partly due to persistent nausea and vomiting with persistent hypotension requiring IV fluids on IV Levophed for pressure support in the setting of sepsis and dehydration  Assessment & Plan:   Principal Problem:   Sepsis secondary to UTI [facility]) Active Problems:   Acute kidney injury ([place])   Hypokalemia   Symptomatic anemia   Hyponatremia   High anion gap metabolic acidosis   Dehydration   Leukocytosis   Microcytic anemia   Hypoalbuminemia due to protein-calorie malnutrition (HCC)   Acute metabolic encephalopathy   Mixed hyperlipidemia   Type 2 diabetes mellitus (HCC)   Gout   Prolonged QT interval   Obesity, Class III, BMI 40-49.9 (morbid obesity) ([place])   1) persistent hypotension/hemodynamic instability--- continues to require IV Levophed for pressure support -Continues to require IV fluids -Unable to rule out sepsis at this time continue IV vancomycin and cefepime pending further culture data--- there is concern for both UTI and pneumonia  2)AKi on CKD IV with anion gap metabolic acidosis--- suspect ATN --patient with significant dehydration due to persistent nausea and vomiting, also suspect kidney injury secondary to persistent hypotension - CT a/p with dysplastic right kidney and no stones or hydronephrosis on the left.   -Bicarb 11, tenderness around 10, BUN around 160 -- Consult appreciated, right neck HD catheter placed,- --hemodialysis initiated on 11/09/2021  3)  acute on chronic anemia--- no evidence of ongoing bleeding suspect some component of chronic anemia of CKD -transfused 1 unit of PRBCs -Hemoglobin currently above 7 -Serum iron is 81 iron saturation 71, ferritin is not low, H88 and folic acid pending  4) chronic breast wound--- does not appear infected  5) severe hypokalemia/hyponatremia--potassium was persistently less than 2 despite replacement -Phosphorus and magnesium WNL -Sodium was 126 to 1 28 range -Electrolytes will improve after hemodialysis session  6)DM2- A1C 5.2 reflecting excellent diabetic control PTA -Use Novolog/Humalog Sliding scale insulin with Accu-Cheks/Fingersticks as ordered   7)Social/Ethics--patient's aunt [name] is primary contact and decision maker [phone]) -Patient remains a full code  8) sepsis with septic shock--- suspect related to UTI and pneumonia -PCT 0.66 -WBC 24.1 -Continue IV cefepime and vancomycin pending further culture data  9) Acute metabolic encephalopathy secondary to sepsis with septic shock and persistent hypotension,, dehydration, worsening renal function and electrolyte and metabolic derangement as above -Improved with treatment as above  CRITICAL CARE Performed by: Roxan Hockey   Total critical care time: 43 minutes  Critical care time was exclusive of separately billable procedures and treating other patients.  -Persistent hypotension requiring IV Levophed for pressure support -Severe electrolyte derangements requiring emergent hemodialysis  Critical care was necessary to treat or prevent imminent or life-threatening deterioration.  Critical care was time spent personally by me on the following activities: development of treatment plan with patient and/or surrogate as well as nursing, discussions with consultants, evaluation of patient's response to treatment, examination of patient, obtaining history from patient or surrogate, ordering and performing  treatments  and interventions, ordering and review of laboratory studies, ordering and review of radiographic studies, pulse oximetry and re-evaluation of patient's condition.    Disposition/Need for in-Hospital Stay- patient unable to be discharged at this time due to ---Persistent hypotension requiring IV Levophed for pressure support -Severe electrolyte derangements requiring emergent hemodialysis -Patient will return to [facility] SNF once medically improved  Status is: Inpatient  Remains inpatient appropriate because: As above  Disposition: The patient is from: SNF              Anticipated d/c is to: SNF              Anticipated d/c date is: > 3 days              Patient currently is not medically stable to d/c. Barriers: Not Clinically Stable-   Code Status :  -  Code Status: Full Code   Family Communication:   patient's aunt [name] is primary contact and decision maker [phone])  Consults  : Nephrology/general surgery  DVT Prophylaxis  :   - SCDs  enoxaparin (LOVENOX) injection 30 mg Start: 10/30/21 1000 SCDs Start: 10/30/21 0152    Lab Results  Component Value Date   PLT 239 10/30/2021    Inpatient Medications  Scheduled Meds:  Chlorhexidine Gluconate Cloth  6 each Topical Daily   Chlorhexidine Gluconate Cloth  6 each Topical Q0600   Chlorhexidine Gluconate Cloth  6 each Topical Q0600   enoxaparin (LOVENOX) injection  30 mg Subcutaneous Q24H   insulin aspart  0-5 Units Subcutaneous QHS   insulin aspart  0-9 Units Subcutaneous TID WC   mupirocin ointment   Nasal BID   potassium chloride  40 mEq Oral Q3H   potassium chloride SA  40 mEq Oral Once   vancomycin variable dose per unstable renal function (pharmacist dosing)   Does not apply See admin instructions   Continuous Infusions:  sodium chloride Stopped (10/29/21 2251)   0.9 % NaCl with KCl 40 mEq / L 100 mL/hr (10/30/21 0832)   ceFEPime (MAXIPIME) IV     lactated ringers 150 mL/hr at 10/30/21  0531   norepinephrine (LEVOPHED) Adult infusion 12 mcg/min (10/30/21 1542)   vancomycin     PRN Meds:.prochlorperazine   Anti-infectives (From admission, onward)    Start     Dose/Rate Route Frequency Ordered Stop   10/30/21 2000  vancomycin (VANCOREADY) IVPB 1500 mg/300 mL  Status:  Discontinued        1,500 mg 150 mL/hr over 120 Minutes Intravenous Every 24 hours 10/29/21 1806 10/29/21 2007   10/30/21 2000  ceFEPIme (MAXIPIME) 1 g in sodium chloride 0.9 % 100 mL IVPB        1 g 200 mL/hr over 30 Minutes Intravenous Every 24 hours 10/29/21 2009 11/05/21 1959   10/30/21 1600  vancomycin (VANCOREADY) IVPB 750 mg/150 mL        750 mg 150 mL/hr over 60 Minutes Intravenous Every M-W-F (Hemodialysis) 10/30/21 1146 11/02/21 1559   10/30/21 1136  vancomycin variable dose per unstable renal function (pharmacist dosing)         Does not apply See admin instructions 10/30/21 1137     10/30/21 0400  ceFEPIme (MAXIPIME) 2 g in sodium chloride 0.9 % 100 mL IVPB  Status:  Discontinued        2 g 200 mL/hr over 30 Minutes Intravenous Every 8 hours 10/29/21 1806 10/29/21 2007   10/29/21 2015  ceFEPIme (MAXIPIME) 1 g in  sodium chloride 0.9 % 100 mL IVPB  Status:  Discontinued        1 g 200 mL/hr over 30 Minutes Intravenous Every 24 hours 10/29/21 2007 10/29/21 2009   10/29/21 1800  ceFEPIme (MAXIPIME) 2 g in sodium chloride 0.9 % 100 mL IVPB        2 g 200 mL/hr over 30 Minutes Intravenous  Once 10/29/21 1747 10/29/21 1852   10/29/21 1800  metroNIDAZOLE (FLAGYL) IVPB 500 mg        500 mg 100 mL/hr over 60 Minutes Intravenous  Once 10/29/21 1747 10/29/21 2004   10/29/21 1800  vancomycin (VANCOCIN) IVPB 1000 mg/200 mL premix        1,000 mg 200 mL/hr over 60 Minutes Intravenous  Once 10/29/21 1747 10/29/21 2149   10/29/21 1800  vancomycin (VANCOCIN) IVPB 1000 mg/200 mL premix        1,000 mg 200 mL/hr over 60 Minutes Intravenous  Once 10/29/21 1754 10/29/21 2100          Subjective: Courtney Dyer today has no fevers, no emesis,  No chest pain,   Very lethargic, -Not very verbal  Objective: Vitals:   10/30/21 1400 10/30/21 1500 10/30/21 1635 10/30/21 1652  BP: (!) 99/40 101/65 119/61   Pulse: 83 79 97   Resp: 15 20 18    Temp: (!) 97.2 F (36.2 C) (!) 97.3 F (36.3 C) (!) 97.3 F (36.3 C) (!) 97.3 F (36.3 C)  TempSrc:   Rectal Axillary  SpO2: 100% 100% 100%   Weight:   75.4 kg   Height:        Intake/Output Summary (Last 24 hours) at 10/30/2021 1723 Last data filed at 10/30/2021 1500 Gross per 24 hour  Intake 4062.48 ml  Output --  Net 4062.48 ml   Filed Weights   10/29/21 1648 10/30/21 0203 10/30/21 1635  Weight: 109 kg 75.4 kg 75.4 kg     Physical Exam  Gen:-Lethargic, appears comfortable  Neck-Left neck central line, right neck temporary HD catheter Lungs-bases with bibasilar rhonchi  CV- S1, S2 normal, regular  Abd-  +ve B.Sounds, Abd Soft, No tenderness,    Extremity/Skin:- No  edema, pedal pulses present  NeuroPsych-affect is flat, patient is lethargic  GU-Foley in situ  data Reviewed: I have personally reviewed following labs and imaging studies  CBC: Recent Labs  Lab 10/29/21 1700 10/29/21 2236 10/30/21 0423  WBC 17.8* 23.9* 24.1*  NEUTROABS 15.6*  --   --   HGB 7.1* 7.6* 7.4*  HCT 21.0* 22.7* 21.5*  MCV 78.4* 80.8 78.5*  PLT 239 246 751   Basic Metabolic Panel: Recent Labs  Lab 10/29/21 2119 10/30/21 0423 10/30/21 0717 10/30/21 1007 10/30/21 1304 10/30/21 1340 10/30/21 1555  NA 126* 128*   128* 126* 127* 127*  --  128*  K <2.0* <2.0*   <2.0* <2.0* 2.4* 2.5*  --  2.2*  CL 99 100   100 100 101 102  --  103  CO2 11* 12*   12* 12* 12* 11*  --  11*  GLUCOSE 181* 184*   184* 182* 175* 168*  --  177*  BUN 188* 172*   172* 172* 166* 162*  --  159*  CREATININE 10.60* 10.11*   10.09* 10.12* 9.58* 9.22*  --  9.68*  CALCIUM 7.4* 7.9*   7.9* 7.6* 7.7* 7.6*  --  7.6*  MG 2.3  --   --   --   --   --   --  PHOS  --  3.3  --   --   --  2.9  --    GFR: Estimated Creatinine Clearance: 6.7 mL/min (A) (by C-G formula based on SCr of 9.68 mg/dL (H)). Liver Function Tests: Recent Labs  Lab 10/29/21 1700 10/30/21 0423 10/30/21 1340  AST 24 19  --   ALT 14 10  --   ALKPHOS 104 86  --   BILITOT 0.9 0.8  --   PROT 6.4* 5.2*  --   ALBUMIN 2.3* 1.9* 1.8*   No results for input(s): LIPASE, AMYLASE in the last 168 hours. No results for input(s): AMMONIA in the last 168 hours. Coagulation Profile: Recent Labs  Lab 10/29/21 1751  INR 1.4*   Cardiac Enzymes: No results for input(s): CKTOTAL, CKMB, CKMBINDEX, TROPONINI in the last 168 hours. BNP (last 3 results) No results for input(s): PROBNP in the last 8760 hours. HbA1C: Recent Labs    10/30/21 0423  HGBA1C 5.2   CBG: Recent Labs  Lab 10/30/21 0202 10/30/21 1315  GLUCAP 159* 140*   Lipid Profile: No results for input(s): CHOL, HDL, LDLCALC, TRIG, CHOLHDL, LDLDIRECT in the last 72 hours. Thyroid Function Tests: No results for input(s): TSH, T4TOTAL, FREET4, T3FREE, THYROIDAB in the last 72 hours. Anemia Panel: Recent Labs    10/30/21 0423  FERRITIN 1,080*  TIBC 115*  IRON 81   Urine analysis:    Component Value Date/Time   COLORURINE GREEN (A) 10/29/2021 2005   APPEARANCEUR CLOUDY (A) 10/29/2021 2005   LABSPEC 1.020 10/29/2021 2005   PHURINE 6.0 10/29/2021 2005   GLUCOSEU NEGATIVE 10/29/2021 2005   HGBUR SMALL (A) 10/29/2021 2005   BILIRUBINUR NEGATIVE 10/29/2021 2005   KETONESUR 20 (A) 10/29/2021 2005   PROTEINUR 100 (A) 10/29/2021 2005   NITRITE POSITIVE (A) 10/29/2021 2005   LEUKOCYTESUR LARGE (A) 10/29/2021 2005   Sepsis Labs: @LABRCNTIP (procalcitonin:4,lacticidven:4)  ) Recent Results (from the past 240 hour(s))  Resp Panel by RT-PCR (Flu A&B, Covid) Nasopharyngeal Swab     Status: None   Collection Time: 10/29/21  5:27 PM   Specimen: Nasopharyngeal Swab; Nasopharyngeal(NP) swabs in vial transport  medium  Result Value Ref Range Status   SARS Coronavirus 2 by RT PCR NEGATIVE NEGATIVE Final    Comment: (NOTE) SARS-CoV-2 target nucleic acids are NOT DETECTED.  The SARS-CoV-2 RNA is generally detectable in upper respiratory specimens during the acute phase of infection. The lowest concentration of SARS-CoV-2 viral copies this assay can detect is 138 copies/mL. A negative result does not preclude SARS-Cov-2 infection and should not be used as the sole basis for treatment or other patient management decisions. A negative result may occur with  improper specimen collection/handling, submission of specimen other than nasopharyngeal swab, presence of viral mutation(s) within the areas targeted by this assay, and inadequate number of viral copies(<138 copies/mL). A negative result must be combined with clinical observations, patient history, and epidemiological information. The expected result is Negative.  Fact Sheet for Patients:  EntrepreneurPulse.com.au  Fact Sheet for Healthcare Providers:  IncredibleEmployment.be  This test is no t yet approved or cleared by the [place] FDA and  has been authorized for detection and/or diagnosis of SARS-CoV-2 by FDA under an Emergency Use Authorization (EUA). This EUA will remain  in effect (meaning this test can be used) for the duration of the COVID-19 declaration under Section 564(b)(1) of the Act, 21 U.S.C.section 360bbb-3(b)(1), unless the authorization is terminated  or revoked sooner.       Influenza  A by PCR NEGATIVE NEGATIVE Final   Influenza B by PCR NEGATIVE NEGATIVE Final    Comment: (NOTE) The Xpert Xpress SARS-CoV-2/FLU/RSV plus assay is intended as an aid in the diagnosis of influenza from Nasopharyngeal swab specimens and should not be used as a sole basis for treatment. Nasal washings and aspirates are unacceptable for Xpert Xpress SARS-CoV-2/FLU/RSV testing.  Fact Sheet for  Patients: EntrepreneurPulse.com.au  Fact Sheet for Healthcare Providers: IncredibleEmployment.be  This test is not yet approved or cleared by the [place] FDA and has been authorized for detection and/or diagnosis of SARS-CoV-2 by FDA under an Emergency Use Authorization (EUA). This EUA will remain in effect (meaning this test can be used) for the duration of the COVID-19 declaration under Section 564(b)(1) of the Act, 21 U.S.C. section 360bbb-3(b)(1), unless the authorization is terminated or revoked.  Performed at [facility], [address], [place], [place] [address]   Culture, blood (routine x 2)     Status: None (Preliminary result)   Collection Time: 10/29/21  5:51 PM   Specimen: BLOOD  Result Value Ref Range Status   Specimen Description BLOOD BLOOD RIGHT ARM  Final   Special Requests   Final    BOTTLES DRAWN AEROBIC AND ANAEROBIC Blood Culture adequate volume   Culture   Final    NO GROWTH < 12 HOURS Performed at [facility], [address] [place], [place] [address]    Report Status PENDING  Incomplete  Culture, blood (routine x 2)     Status: None (Preliminary result)   Collection Time: 10/29/21  9:19 PM   Specimen: BLOOD  Result Value Ref Range Status   Specimen Description BLOOD central line  Final   Special Requests   Final    BOTTLES DRAWN AEROBIC AND ANAEROBIC Blood Culture adequate volume   Culture   Final    NO GROWTH < 12 HOURS Performed at [facility], [address] [place], [place] [address]    Report Status PENDING  Incomplete  MRSA Next Gen by PCR, Nasal     Status: Abnormal   Collection Time: 10/30/21  1:52 AM   Specimen: Nasal Mucosa; Nasal Swab  Result Value Ref Range Status   MRSA by PCR Next Gen DETECTED (A) NOT DETECTED Final    Comment: RESULT CALLED TO, READ BACK BY AND VERIFIED WITH: ASHLEY @ 1055 ON [id number] BY HENDERSON L (NOTE) The GeneXpert MRSA Assay (FDA approved for NASAL specimens  only), is one component of a comprehensive MRSA colonization surveillance program. It is not intended to diagnose MRSA infection nor to guide or monitor treatment for MRSA infections. Test performance is not FDA approved in patients less than 25 years old. Performed at [facility], [address] [place],  [address]       Radiology Studies: CT ABDOMEN PELVIS WO CONTRAST  Result Date: 10/29/2021 CLINICAL DATA:  Sepsis EXAM: CT ABDOMEN AND PELVIS WITHOUT CONTRAST TECHNIQUE: Multidetector CT imaging of the abdomen and pelvis was performed following the standard protocol without IV contrast. COMPARISON:  08/26/2021 FINDINGS: Lower chest: Elevation of the right hemidiaphragm. Right base atelectasis or infiltrate. Left lung base clear. Hepatobiliary: Small layering gallstones within the gallbladder. Mild distention of the gallbladder. No focal hepatic abnormality. Pancreas: No focal abnormality or ductal dilatation. Spleen: No focal abnormality.  Normal size. Adrenals/Urinary Tract: Stable left adrenal adenoma. Dysplastic right kidney. No stones or hydronephrosis on the left. No renal mass. Foley catheter present in the bladder which is decompressed. Stomach/Bowel: Normal  appendix. Sigmoid diverticulosis. No active diverticulitis. Stomach and small bowel decompressed, unremarkable. Vascular/Lymphatic: No evidence of aneurysm or adenopathy. Reproductive: Uterus and adnexa unremarkable.  No mass. Other: No free fluid or free air. Musculoskeletal: No acute bony abnormality. IMPRESSION: Elevation of the right hemidiaphragm with right base atelectasis or infiltrate. Sigmoid diverticulosis.  No active diverticulitis. Cholelithiasis. Gallbladder mildly distended. This could be further evaluated with ultrasound if felt clinically indicated. Electronically Signed   By: Rolm Baptise M.D.   On: 10/29/2021 22:11   DG Chest Port 1 View  Result Date: 10/30/2021 CLINICAL DATA:  Dialysis catheter placement  EXAM: PORTABLE CHEST 1 VIEW COMPARISON:  Yesterday FINDINGS: Left internal jugular line tip at high right atrium. Placement of a right internal jugular dialysis catheter with tip at mid right atrium. Midline trachea. Mild cardiomegaly. No pleural effusion or pneumothorax. Lung volumes are low. Moderate right hemidiaphragm elevation with mild volume loss in the adjacent right lung base. Minimal subsegmental atelectasis at the left lung base. IMPRESSION: No pneumothorax after right internal jugular line dialysis catheter placement. Cardiomegaly without congestive failure. Electronically Signed   By: Abigail Miyamoto M.D.   On: 10/30/2021 13:12   DG Chest Portable 1 View  Result Date: 10/29/2021 CLINICAL DATA:  IJ line placement EXAM: PORTABLE CHEST 1 VIEW COMPARISON:  10/29/2021 FINDINGS: Left internal jugular central line is in place with the tip at the cavoatrial junction. Elevation of the right hemidiaphragm with right base atelectasis/infiltrate. Left lung clear. No effusions or pneumothorax. No acute bony abnormality. IMPRESSION: Left central line tip at the cavoatrial junction.  No pneumothorax. Stable elevation of the right hemidiaphragm with right base atelectasis or infiltrate. Electronically Signed   By: Rolm Baptise M.D.   On: 10/29/2021 22:06   DG Chest Portable 1 View  Result Date: 10/29/2021 CLINICAL DATA:  Hypotension.  Abnormal labs. EXAM: PORTABLE CHEST 1 VIEW COMPARISON:  Chest radiograph dated August 26, 2021 FINDINGS: The heart size and mediastinal contours are within normal limits. Low lung volumes. Right basilar atelectasis/infiltrate. Advanced bilateral glenohumeral osteoarthritis. IMPRESSION: 1.  Low lung volumes with right basilar atelectasis/infiltrate. 2.  Advanced chronic bilateral glenohumeral osteoarthritis. Electronically Signed   By: Keane Police D.O.   On: 10/29/2021 18:18     Scheduled Meds:  Chlorhexidine Gluconate Cloth  6 each Topical Daily   Chlorhexidine Gluconate  Cloth  6 each Topical Q0600   Chlorhexidine Gluconate Cloth  6 each Topical Q0600   enoxaparin (LOVENOX) injection  30 mg Subcutaneous Q24H   insulin aspart  0-5 Units Subcutaneous QHS   insulin aspart  0-9 Units Subcutaneous TID WC   mupirocin ointment   Nasal BID   potassium chloride  40 mEq Oral Q3H   potassium chloride SA  40 mEq Oral Once   vancomycin variable dose per unstable renal function (pharmacist dosing)   Does not apply See admin instructions   Continuous Infusions:  sodium chloride Stopped (10/29/21 2251)   0.9 % NaCl with KCl 40 mEq / L 100 mL/hr (10/30/21 0832)   ceFEPime (MAXIPIME) IV     lactated ringers 150 mL/hr at 10/30/21 0531   norepinephrine (LEVOPHED) Adult infusion 12 mcg/min (10/30/21 1542)   vancomycin       LOS: 0 days    Roxan Hockey M.D on 10/30/2021 at 5:23 PM  Go to [email or web address] - for contact info  [facility] - Office  [phone]  If 7PM-7AM, please contact night-coverage [email or web address] Password [facility] 10/30/2021, 5:23 PM

## 2021-10-30 NOTE — Progress Notes (Signed)
Initial Nutrition Assessment  DOCUMENTATION CODES:      INTERVENTION:  When pt is cleared for diet advancement: Provide Nepro BID  Recommend renal vitamin  NUTRITION DIAGNOSIS:   Inadequate oral intake related to inability to eat as evidenced by NPO status (pt unsafe for po's currently per nursing).   GOAL:  Provide needs based on ASPEN/SCCM guidelines   MONITOR:  Diet advancement, Supplement acceptance, Labs, I & O's, Weight trends  REASON FOR ASSESSMENT:   Low Braden    ASSESSMENT: Patient is a 55 yo female with hx of CKD-IV, DM2, gout and nephrectomy. She presents from SNF with anemia. Septic shock- UTI. Metabolic acidosis, hyponatremia, hypokalemia.   Surgery consulted for placement of non tunneled catheter for dialysis. HD planned for today and on 12/17 per nephrology.   Patient vomited this morning after taking potassium. NPO - not alert enough to safely take meds this morning per nursing. Patient discussed with nursing and during progression. She has received multiple runs of potassium. No skin breakdown.   Medications reviewed and include:  insulin, potassium chloride.   Patient weight hx reviewed. 08/06/21 wt. 77.1 kg and current 75.4 kg. There is a wt of 108.9 kg on 08/26/21- expect this is an outlier. Will follow for EDW as she receives HD.   Drip : Vancomycin   IVF- Lactated Ringers @ 150 ml/hr.   Labs: Potassium repletion underway BMP Latest Ref Rng & Units 10/30/2021 10/30/2021 10/30/2021  Glucose 70 - 99 mg/dL 168(H) 175(H) 182(H)  BUN 6 - 20 mg/dL 162(H) 166(H) 172(H)  Creatinine 0.44 - 1.00 mg/dL 9.22(H) 9.58(H) 10.12(H)  Sodium 135 - 145 mmol/L 127(L) 127(L) 126(L)  Potassium 3.5 - 5.1 mmol/L 2.5(LL) 2.4(LL) <2.0(LL)  Chloride 98 - 111 mmol/L 102 101 100  CO2 22 - 32 mmol/L 11(L) 12(L) 12(L)  Calcium 8.9 - 10.3 mg/dL 7.6(L) 7.7(L) 7.6(L)      NUTRITION - FOCUSED PHYSICAL EXAM:  Flowsheet Row Most Recent Value  Orbital Region Mild depletion   Upper Arm Region No depletion  Thoracic and Lumbar Region No depletion  Buccal Region No depletion  Temple Region No depletion  Clavicle Bone Region No depletion  Clavicle and Acromion Bone Region Mild depletion  Scapular Bone Region Unable to assess  Dorsal Hand No depletion  Patellar Region No depletion  Edema (RD Assessment) Mild  Hair Reviewed  Eyes Reviewed  Mouth Reviewed  [lips dry and cracked]  Skin Reviewed  Nails Reviewed      Diet Order:   Diet Order             Diet NPO time specified  Diet effective ____           Diet NPO time specified  Diet effective now                   EDUCATION NEEDS:  Not appropriate for education at this time  Skin:  Skin Assessment: Reviewed RN Assessment. Recent hx of Stage 2 to buttocks (09/01/21)  Last BM:  12/16 type 7  Height:   Ht Readings from Last 1 Encounters:  10/29/21 5\' 4"  (1.626 m)    Weight:   Wt Readings from Last 1 Encounters:  10/30/21 75.4 kg    Ideal Body Weight:   55 kg  BMI:  Body mass index is 28.53 kg/m.  Estimated Nutritional Needs:   Kcal:     Protein:     Fluid:      Colman Cater MS,RD,CSG,LDN Contact: AMION

## 2021-10-30 NOTE — ED Notes (Signed)
Pt vomited crushed potassium and applesauce. MD aware.

## 2021-10-30 NOTE — H&P (Addendum)
History and Physical  Courtney Dyer RDE:[id number] DOB: [birth year] DOA: 10/29/2021  Referring physician: Sherwood Gambler, MD PCP: Pcp, No  Patient coming from: [facility]  Chief Complaint: Anemia  HPI: Courtney Dyer is a 55 y.o. female with medical history significant for hyperlipidemia, T2DM, gout, GERD who presents to the emergency department via EMS due to anemia, patient was unable to provide a history.  History was obtained from ED physician and ED medical record, per report, patient's hemoglobin was noted to be at 6.1, so EMS was activated and patient was sent to the ED for further evaluation and management.  Patient was also reported to have had chronic right breast wound by a cousin who states that patient has been having diarrhea and vomiting for several weeks to months per ED medical record.  ED Course:  In the emergency department, she was hypothermic with a temperature of 93.40F, BP 82/50, but other vital signs were within normal range.  Work-up in the ED showed leukocytosis, microcytic anemia, hyponatremia, hypokalemia, bicarb of 11, BUN/creatinine 218/2.87 (baseline creatinine at 1.7-2.1), anion gap 23, albumin 2.3, urinalysis was positive for UTI, magnesium 2.3, lactic acid 0.8.  Influenza A, B, SARS coronavirus 2 was negative. Chest x-ray showed low lung volumes with right basilar atelectasis/infiltrate CT abdomen and pelvis without contrast showed elevation of the right hemidiaphragm with right basilar atelectasis or infiltrate. Type and screen was done, 1 unit of blood was given.  IV hydration was provided.  Left-sided central line was placed, patient was started on IV Levophed for pressor support, she was empirically started on IV antibiotics (cefepime, vancomycin and Flagyl).  PCCM at [facility] ( Dr Ilda Mori) was consulted and indicated no real indication to transfer patient to [facility] and the patient could be treated at [facility].  Nephrologist (Dr. Royce Macadamia) was consulted and  recommended against the bicarb drip, since this will worsen potassium level (already low), but recommended to continue fluids and to give more IV potassium and recheck BMP every 3 hours with plan to follow-up on patient in the morning, no indication for emergent dialysis at this time per ED medical record.  Hospitalist was asked to admit patient for further evaluation and management.  Review of Systems: This cannot be obtained at this time due to patient being somnolent and though easily arousable, quickly goes back to sleep  Past Medical History:  Diagnosis Date   Diabetes mellitus    Gout    High cholesterol    Past Surgical History:  Procedure Laterality Date   BIOPSY  08/28/2021   Procedure: BIOPSY;  Surgeon: Harvel Quale, MD;  Location: [facility];  Service: Gastroenterology;;   BREAST SURGERY     ESOPHAGOGASTRODUODENOSCOPY (EGD) WITH PROPOFOL N/A 08/28/2021   Procedure: ESOPHAGOGASTRODUODENOSCOPY (EGD) WITH PROPOFOL;  Surgeon: Harvel Quale, MD;  Location: [facility];  Service: Gastroenterology;  Laterality: N/A;   NEPHRECTOMY      Social History:  reports that she has been smoking cigarettes. She has been smoking an average of .5 packs per day. She has never used smokeless tobacco. She reports current alcohol use. She reports that she does not use drugs.   No Known Allergies  Family History  Problem Relation Age of Onset   Hypertension Maternal Aunt       Prior to Admission medications   Medication Sig Start Date End Date Taking? Authorizing Provider  folic acid (FOLVITE) 1 MG tablet Take 1 tablet (1 mg total) by mouth daily. 08/10/21  Yes Tat, Shanon Brow, MD  magnesium oxide (MAG-OX) 400 (240 Mg) MG tablet Take 1 tablet (400 mg total) by mouth daily. 08/09/21  Yes Tat, Shanon Brow, MD  Multiple Vitamin (MULTIVITAMIN WITH MINERALS) TABS tablet Take 1 tablet by mouth daily. 09/02/21  Yes Johnson, Clanford L, MD  ondansetron (ZOFRAN) 4 MG tablet Take 1  tablet (4 mg total) by mouth every 6 (six) hours as needed for nausea or vomiting. 09/01/21  Yes Johnson, Clanford L, MD  pantoprazole (PROTONIX) 40 MG tablet Take 1 tablet (40 mg total) by mouth 2 (two) times daily before a meal. 09/01/21 11/30/21 Yes Johnson, Clanford L, MD  sucralfate (CARAFATE) 1 g tablet Take 1 tablet (1 g total) by mouth 4 (four) times daily -  with meals and at bedtime for 14 days. 09/01/21 10/29/21 Yes Johnson, Clanford L, MD  thiamine 100 MG tablet Take 1 tablet (100 mg total) by mouth daily. 09/02/21  Yes Johnson, Clanford L, MD  traMADol (ULTRAM) 50 MG tablet Take 50 mg by mouth every 6 (six) hours as needed.   Yes [provider]  Nutritional Supplements (,FEEDING SUPPLEMENT, PROSOURCE PLUS) liquid Take 30 mLs by mouth 3 (three) times daily between meals. 09/01/21   Johnson, Clanford L, MD  nystatin (MYCOSTATIN/NYSTOP) powder Apply topically 3 (three) times daily. 09/01/21   Murlean Iba, MD    Physical Exam: BP 99/62    Pulse 84    Temp 98.1 F (36.7 C)    Resp 16    Ht 5\' 4"  (1.626 m)    Wt 109 kg    LMP 09/26/2015    SpO2 99%    BMI 41.25 kg/m   General: 55 y.o. year-old female ill appearing but in no acute distress.  Somnolent, but easily arousable and quickly goes back to sleep HEENT: Dry mucous membrane.  NCAT, pale conjunctiva, PERRL Neck: Supple, trachea medial Cardiovascular: Regular rate and rhythm with no rubs or gallops.  No thyromegaly or JVD noted.  No lower extremity edema. 2/4 pulses in all 4 extremities. Respiratory: Clear to auscultation with no wheezes or rales.  Abdomen: Soft, nontender nondistended with normal bowel sounds x4 quadrants. Muskuloskeletal: No cyanosis, clubbing or edema noted bilaterally Neuro: Moves all 4 extremities, responds to verbal stimuli.  Sensation, reflexes intact Skin: No ulcerative lesions noted or rashes Psychiatry: Mood is appropriate for condition and setting          Labs on Admission:  Basic  Metabolic Panel: Recent Labs  Lab 10/29/21 1700 10/29/21 2119  NA 125* 126*  K <2.0* <2.0*  CL 91* 99  CO2 11* 11*  GLUCOSE 96 181*  BUN 218* 188*  CREATININE 12.87* 10.60*  CALCIUM 8.7* 7.4*  MG  --  2.3   Liver Function Tests: Recent Labs  Lab 10/29/21 1700  AST 24  ALT 14  ALKPHOS 104  BILITOT 0.9  PROT 6.4*  ALBUMIN 2.3*   No results for input(s): LIPASE, AMYLASE in the last 168 hours. No results for input(s): AMMONIA in the last 168 hours. CBC: Recent Labs  Lab 10/29/21 1700 10/29/21 2236  WBC 17.8* 23.9*  NEUTROABS 15.6*  --   HGB 7.1* 7.6*  HCT 21.0* 22.7*  MCV 78.4* 80.8  PLT 239 246   Cardiac Enzymes: No results for input(s): CKTOTAL, CKMB, CKMBINDEX, TROPONINI in the last 168 hours.  BNP (last 3 results) No results for input(s): BNP in the last 8760 hours.  ProBNP (last 3 results) No results for input(s): PROBNP in  the last 8760 hours.  CBG: No results for input(s): GLUCAP in the last 168 hours.  Radiological Exams on Admission: CT ABDOMEN PELVIS WO CONTRAST  Result Date: 10/29/2021 CLINICAL DATA:  Sepsis EXAM: CT ABDOMEN AND PELVIS WITHOUT CONTRAST TECHNIQUE: Multidetector CT imaging of the abdomen and pelvis was performed following the standard protocol without IV contrast. COMPARISON:  08/26/2021 FINDINGS: Lower chest: Elevation of the right hemidiaphragm. Right base atelectasis or infiltrate. Left lung base clear. Hepatobiliary: Small layering gallstones within the gallbladder. Mild distention of the gallbladder. No focal hepatic abnormality. Pancreas: No focal abnormality or ductal dilatation. Spleen: No focal abnormality.  Normal size. Adrenals/Urinary Tract: Stable left adrenal adenoma. Dysplastic right kidney. No stones or hydronephrosis on the left. No renal mass. Foley catheter present in the bladder which is decompressed. Stomach/Bowel: Normal appendix. Sigmoid diverticulosis. No active diverticulitis. Stomach and small bowel decompressed,  unremarkable. Vascular/Lymphatic: No evidence of aneurysm or adenopathy. Reproductive: Uterus and adnexa unremarkable.  No mass. Other: No free fluid or free air. Musculoskeletal: No acute bony abnormality. IMPRESSION: Elevation of the right hemidiaphragm with right base atelectasis or infiltrate. Sigmoid diverticulosis.  No active diverticulitis. Cholelithiasis. Gallbladder mildly distended. This could be further evaluated with ultrasound if felt clinically indicated. Electronically Signed   By: Rolm Baptise M.D.   On: 10/29/2021 22:11   DG Chest Portable 1 View  Result Date: 10/29/2021 CLINICAL DATA:  IJ line placement EXAM: PORTABLE CHEST 1 VIEW COMPARISON:  10/29/2021 FINDINGS: Left internal jugular central line is in place with the tip at the cavoatrial junction. Elevation of the right hemidiaphragm with right base atelectasis/infiltrate. Left lung clear. No effusions or pneumothorax. No acute bony abnormality. IMPRESSION: Left central line tip at the cavoatrial junction.  No pneumothorax. Stable elevation of the right hemidiaphragm with right base atelectasis or infiltrate. Electronically Signed   By: Rolm Baptise M.D.   On: 10/29/2021 22:06   DG Chest Portable 1 View  Result Date: 10/29/2021 CLINICAL DATA:  Hypotension.  Abnormal labs. EXAM: PORTABLE CHEST 1 VIEW COMPARISON:  Chest radiograph dated August 26, 2021 FINDINGS: The heart size and mediastinal contours are within normal limits. Low lung volumes. Right basilar atelectasis/infiltrate. Advanced bilateral glenohumeral osteoarthritis. IMPRESSION: 1.  Low lung volumes with right basilar atelectasis/infiltrate. 2.  Advanced chronic bilateral glenohumeral osteoarthritis. Electronically Signed   By: Keane Police D.O.   On: 10/29/2021 18:18    EKG: I independently viewed the EKG done and my findings are as followed: Normal sinus rhythm at a rate of 88 bpm with prolonged QTc (546 ms)  Assessment/Plan Present on Admission:  Sepsis secondary to  UTI ([place])  Hypokalemia  Principal Problem:   Sepsis secondary to UTI [facility]) Active Problems:   Acute kidney injury superimposed on CKD ([place])   Hypokalemia   Symptomatic anemia   Hyponatremia   High anion gap metabolic acidosis   Dehydration   Leukocytosis   Microcytic anemia   Hypoalbuminemia due to protein-calorie malnutrition ([place])   Acute metabolic encephalopathy   Mixed hyperlipidemia   Type 2 diabetes mellitus ([facility])   Gout   Prolonged QT interval  Acute metabolic encephalopathy possibly secondary to sepsis due to UTI She was empirically started on IV vancomycin, cefepime and Flagyl Urinalysis was indicative of UTI and patient was continued with IV cefepime, we shall continue with same at this time with plan to de-escalate/discontinue based on urine culture, blood culture and procalcitonin Continue IV hydration Continue IV Protonix to prevent stress induced ulcer Continue IV Levophed with  goal to maintain MAP of greater than or equal to 65 and plan to wean patient off pressor as tolerated  High anion gap metabolic acidosis possibly secondary to multifactorial including above and dehydration Continue IV hydration and continue management as described above  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 BUN/creatinine 218/2.87 (baseline creatinine at 1.7-2.1) Patient is currently in stage V kidney disease, but still produces some urine Continue IV hydration Renally adjust medications, avoid nephrotoxic agents/dehydration/hypotension Nephrology was consulted by ED physician and will follow up with patient in the morning  Hypokalemia K+ is < 2 K+ will be replenished Please monitor for AM K+ for further replenishmemnt Nephrology was consulted and will follow up with patient in the morning  Hyponatremia Na 125, continue IV hydration Continue to monitor BMP  Reported vomiting and diarrhea Continue Compazine as needed Patient has not had any diarrhea since arrival to  the ED Continue to monitor and treat accordingly  Chronic breast wound Continue wound care  Prolonged QTc (546 ms) Avoid QT prolonging drugs Magnesium level was 2.3 K+ was < 2.0-this will be replenished Repeat EKG in the morning  Symptomatic anemia/microcytic anemia H/H 7.4/21.0, 1 unit of PRBC was transfused with subsequent hemoglobin being at 7.6 (possibly due to hemodilution) Iron studies will be checked Continue to monitor CBC and treat accordingly  Leukocytosis possibly secondary to UTI versus reactive Continue antibiotics as indicated above for UTI Continue to monitor WBC with morning labs  Hypoalbuminemia secondary to moderate protein calorie malnutrition Consider protein supplement when patient is alert enough to be able to tolerate oral intake  GERD Continue Protonix  Mixed hyperlipidemia No antihyperlipidemic medication noted on patient's med rec We shall await updated med rec  Type II diabetes mellitus Last A1c done 2 months ago was 6.3 Continue diet modification when patient resumes oral intake  Gout No antigout medication noted on patient's med rec, we shall await updated med rec  Obesity current BMI 41.25(kg/m) Patient will be counseled on diet and lifestyle modification when more alert   DVT prophylaxis: Lovenox  Code Status: Full code  Family Communication: None at bedside  Disposition Plan:  Patient is from:                        home Anticipated DC to:                   SNF or family members home Anticipated DC date:               2-3 days Anticipated DC barriers:          Patient requires inpatient management due to sepsis secondary to UTI requiring IV antibiotics and symptomatic anemia  Consults called: Nephrology  Admission status: Inpatient    Bernadette Hoit MD [facility]  10/30/2021, 1:39 AM

## 2021-10-31 ENCOUNTER — Inpatient Hospital Stay: Payer: Medicare Other

## 2021-10-31 LAB — GASTROINTESTINAL PANEL BY PCR, STOOL (REPLACES STOOL CULTURE)

## 2021-10-31 LAB — RENAL FUNCTION PANEL
Albumin: 1.7 g/dL — ABNORMAL LOW (ref 3.5–5.0)
Albumin: 1.7 g/dL — ABNORMAL LOW (ref 3.5–5.0)
Anion gap: 12 (ref 5–15)
Anion gap: 8 (ref 5–15)
BUN: 80 mg/dL — ABNORMAL HIGH (ref 6–20)
BUN: 73 mg/dL — ABNORMAL HIGH (ref 6–20)
CO2: 17 mmol/L — ABNORMAL LOW (ref 22–32)
CO2: 17 mmol/L — ABNORMAL LOW (ref 22–32)
Calcium: 7.7 mg/dL — ABNORMAL LOW (ref 8.9–10.3)
Calcium: 7.5 mg/dL — ABNORMAL LOW (ref 8.9–10.3)
Chloride: 103 mmol/L (ref 98–111)
Chloride: 104 mmol/L (ref 98–111)
Creatinine, Ser: 5.31 mg/dL — ABNORMAL HIGH (ref 0.44–1.00)
Creatinine, Ser: 5.52 mg/dL — ABNORMAL HIGH (ref 0.44–1.00)
GFR, Estimated: 9 mL/min — ABNORMAL LOW (ref >60)
GFR, Estimated: 9 mL/min — ABNORMAL LOW (ref >60)
Glucose, Bld: 157 mg/dL — ABNORMAL HIGH (ref 70–99)
Glucose, Bld: 131 mg/dL — ABNORMAL HIGH (ref 70–99)
Phosphorus: 1.2 mg/dL — ABNORMAL LOW (ref 2.5–4.6)
Phosphorus: 1.4 mg/dL — ABNORMAL LOW (ref 2.5–4.6)
Potassium: 2.8 mmol/L — ABNORMAL LOW (ref 3.5–5.1)
Potassium: 3 mmol/L — ABNORMAL LOW (ref 3.5–5.1)
Sodium: 132 mmol/L — ABNORMAL LOW (ref 135–145)
Sodium: 129 mmol/L — ABNORMAL LOW (ref 135–145)

## 2021-10-31 LAB — MAGNESIUM: Magnesium: 1.7 mg/dL (ref 1.7–2.4)

## 2021-10-31 LAB — FOLATE: Folate: 15.8 ng/mL (ref >5.9)

## 2021-10-31 LAB — GLUCOSE, CAPILLARY
Glucose-Capillary: 144 mg/dL — ABNORMAL HIGH (ref 70–99)
Glucose-Capillary: 119 mg/dL — ABNORMAL HIGH (ref 70–99)
Glucose-Capillary: 95 mg/dL (ref 70–99)
Glucose-Capillary: 134 mg/dL — ABNORMAL HIGH (ref 70–99)

## 2021-10-31 LAB — VITAMIN B12: Vitamin B-12: 829 pg/mL (ref 180–914)

## 2021-10-31 LAB — HEPATITIS B SURFACE ANTIBODY, QUANTITATIVE: Hep B S AB Quant (Post): 14.4 m[IU]/mL (ref >9.9)

## 2021-10-31 MED ORDER — HEPARIN SODIUM (PORCINE) 1000 UNIT/ML DIALYSIS
2600.0000 [IU] | INTRAMUSCULAR | Status: DC | PRN
  Administered 2021-10-31: 2600 [IU] via INTRAVENOUS_CENTRAL

## 2021-10-31 MED ORDER — LACTATED RINGERS IV SOLN: INTRAVENOUS | Status: DC

## 2021-10-31 MED ORDER — POTASSIUM CHLORIDE 20 MEQ PO PACK: 60.0000 meq | PACK | Freq: Once | ORAL | Status: DC

## 2021-10-31 MED ORDER — POTASSIUM CHLORIDE 20 MEQ PO PACK
40.0000 meq | PACK | ORAL | Status: AC
  Administered 2021-10-31 (×2): 40 meq via ORAL
  Filled 2021-10-31: qty 2

## 2021-10-31 MED ORDER — VANCOMYCIN HCL IN DEXTROSE 750-5 MG/150ML-% IV SOLN
750.0000 mg | INTRAVENOUS | Status: AC | PRN
  Administered 2021-10-31: 750 mg via INTRAVENOUS
  Filled 2021-10-31: qty 150

## 2021-10-31 MED ORDER — POTASSIUM CHLORIDE 10 MEQ/100ML IV SOLN
10.0000 meq | INTRAVENOUS | Status: AC
  Administered 2021-10-31 (×4): 10 meq via INTRAVENOUS
  Filled 2021-10-31 (×4): qty 100

## 2021-10-31 MED ORDER — POTASSIUM CHLORIDE 20 MEQ PO PACK
40.0000 meq | PACK | ORAL | Status: AC
  Administered 2021-10-31 (×2): 40 meq via ORAL
  Filled 2021-10-31 (×2): qty 2

## 2021-10-31 MED ORDER — POTASSIUM PHOSPHATES 15 MMOLE/5ML IV SOLN: 30.0000 mmol | Freq: Once | INTRAVENOUS | Status: DC

## 2021-10-31 NOTE — Progress Notes (Addendum)
Report given to Tome, RN on dept 300, patient going to room# 318   Documented on wrong patient

## 2021-10-31 NOTE — Progress Notes (Signed)
PROGRESS NOTE     Courtney Dyer, is a 55 y.o. female, DOB - 02-20-1966, INO:[id number]  Admit date - 10/29/2021   Admitting Physician Bernadette Hoit, DO  Outpatient Primary MD for the patient is Pcp, No  LOS - 1  No chief complaint on file.     Altered mentation  Brief Narrative:  55 y.o. female with medical history significant for hyperlipidemia, T2DM, gout, GERD , HTN, CKD IV admitted on 10/30/2021 with AKI on CKD stage IV, severe electrolyte derangement, and persistent  severe dehydration partly due to persistent nausea and vomiting with persistent hypotension requiring IV fluids on IV Levophed for pressure support in the setting of sepsis and dehydration  Assessment & Plan:   Principal Problem:   Sepsis secondary to UTI [facility]) Active Problems:   Acute kidney injury ([place])   Hypokalemia   Symptomatic anemia   Hyponatremia   High anion gap metabolic acidosis   Dehydration   Leukocytosis   Microcytic anemia   Hypoalbuminemia due to protein-calorie malnutrition (HCC)   Acute metabolic encephalopathy   Mixed hyperlipidemia   Type 2 diabetes mellitus (HCC)   Gout   Prolonged QT interval   Obesity, Class III, BMI 40-49.9 (morbid obesity) ([place])   1)Persistent hypotension/hemodynamic instability--- in the setting of dehydration and presumed sepsis -continues to require IV Levophed for pressure support -Continues to require IV fluids -continue IV vancomycin and cefepime pending further culture data--- there is concern for both UTI and pneumonia  2)AKi on CKD IV with anion gap metabolic acidosis--- suspect ATN --patient with significant dehydration due to persistent nausea and vomiting, also suspect kidney injury secondary to persistent hypotension - CT a/p with dysplastic right kidney and no stones or hydronephrosis on the left.   -Bicarb 11, creatinine was around 10, BUN around 160 -- Consult appreciated, right neck HD catheter placed,- --hemodialysis initiated on  11/09/2021 -Creatinine down to 5.5 after hemodialysis  3) acute on chronic anemia--- no evidence of ongoing bleeding suspect some component of chronic anemia of CKD -transfused 1 unit of PRBCs -Hemoglobin currently above 7 -Serum iron is 81 iron saturation 71, ferritin is not low,  -B12 and folate are not low  4) chronic breast wound--- does not appear infected, Healing left breast periareolar wound--please see ultrasound report from 10/27/2021  5)severe hypokalemia/hyponatremia/hypophosphatemia--potassium was persistently less than 2 despite replacement -magnesium WNL -Replace phosphorus and potassium -Electrolytes will improve after hemodialysis session  6)DM2- A1C 5.2 reflecting excellent diabetic control PTA -Use Novolog/Humalog Sliding scale insulin with Accu-Cheks/Fingersticks as ordered   7)Social/Ethics--patient's aunt [name] is primary contact and decision maker [phone]) -Patient remains a full code  8) sepsis with septic shock--- suspect related to UTI and pneumonia -PCT 0.66 -WBC 24.1 -Continue IV cefepime and vancomycin pending further culture data  9) Acute metabolic encephalopathy secondary to sepsis with septic shock and persistent hypotension,, dehydration, worsening renal function and electrolyte and metabolic derangement as above -Improved with treatment as above  CRITICAL CARE Performed by: Roxan Hockey   Total critical care time: 41 minutes  Critical care time was exclusive of separately billable procedures and treating other patients.  -Persistent hypotension requiring IV Levophed for pressure support -Severe electrolyte derangements requiring emergent hemodialysis and correction  Critical care was necessary to treat or prevent imminent or life-threatening deterioration.  Critical care was time spent personally by me on the following activities: development of treatment plan with patient and/or surrogate as well as nursing,  discussions with consultants, evaluation of patient's response to  treatment, examination of patient, obtaining history from patient or surrogate, ordering and performing treatments and interventions, ordering and review of laboratory studies, ordering and review of radiographic studies, pulse oximetry and re-evaluation of patient's condition.    Disposition/Need for in-Hospital Stay- patient unable to be discharged at this time due to ---Persistent hypotension requiring IV Levophed for pressure support -Severe electrolyte derangements requiring emergent hemodialysis -Patient will return to [facility] once medically improved  Status is: Inpatient  Remains inpatient appropriate because: As above  Disposition: The patient is from: SNF              Anticipated d/c is to: SNF              Anticipated d/c date is: > 3 days              Patient currently is not medically stable to d/c. Barriers: Not Clinically Stable-   Code Status :  -  Code Status: Full Code   Family Communication:   patient's aunt [name] is primary contact and decision maker [phone])  Consults  : Nephrology/general surgery  DVT Prophylaxis  :   - SCDs  enoxaparin (LOVENOX) injection 30 mg Start: 10/30/21 1000 SCDs Start: 10/30/21 0152    Lab Results  Component Value Date   PLT 239 10/30/2021    Inpatient Medications  Scheduled Meds:  Chlorhexidine Gluconate Cloth  6 each Topical Daily   Chlorhexidine Gluconate Cloth  6 each Topical Q0600   Chlorhexidine Gluconate Cloth  6 each Topical Q0600   enoxaparin (LOVENOX) injection  30 mg Subcutaneous Q24H   insulin aspart  0-5 Units Subcutaneous QHS   insulin aspart  0-9 Units Subcutaneous TID WC   mupirocin ointment   Nasal BID   potassium chloride  60 mEq Oral Once   vancomycin variable dose per unstable renal function (pharmacist dosing)   Does not apply See admin instructions   Continuous Infusions:  sodium chloride Stopped (10/29/21 2251)    sodium chloride     sodium chloride     ceFEPime (MAXIPIME) IV 1 g (10/30/21 2008)   lactated ringers     norepinephrine (LEVOPHED) Adult infusion 13 mcg/min (10/31/21 1731)   potassium PHOSPHATE IVPB (in mmol)     vancomycin 750 mg (10/31/21 1740)   PRN Meds:.sodium chloride, sodium chloride, alteplase, heparin, pentafluoroprop-tetrafluoroeth, prochlorperazine, vancomycin   Anti-infectives (From admission, onward)    Start     Dose/Rate Route Frequency Ordered Stop   10/31/21 1400  vancomycin (VANCOCIN) IVPB 750 mg/150 ml premix        750 mg 150 mL/hr over 60 Minutes Intravenous Every Dialysis 10/31/21 0835     10/30/21 2000  vancomycin (VANCOREADY) IVPB 1500 mg/300 mL  Status:  Discontinued        1,500 mg 150 mL/hr over 120 Minutes Intravenous Every 24 hours 10/29/21 1806 10/29/21 2007   10/30/21 2000  ceFEPIme (MAXIPIME) 1 g in sodium chloride 0.9 % 100 mL IVPB        1 g 200 mL/hr over 30 Minutes Intravenous Every 24 hours 10/29/21 2009 11/05/21 1959   10/30/21 1600  vancomycin (VANCOREADY) IVPB 750 mg/150 mL        750 mg 150 mL/hr over 60 Minutes Intravenous Every M-W-F (Hemodialysis) 10/30/21 1146 10/30/21 2006   10/30/21 1136  vancomycin variable dose per unstable renal function (pharmacist dosing)         Does not apply See admin instructions 10/30/21 1137  10/30/21 0400  ceFEPIme (MAXIPIME) 2 g in sodium chloride 0.9 % 100 mL IVPB  Status:  Discontinued        2 g 200 mL/hr over 30 Minutes Intravenous Every 8 hours 10/29/21 1806 10/29/21 2007   10/29/21 2015  ceFEPIme (MAXIPIME) 1 g in sodium chloride 0.9 % 100 mL IVPB  Status:  Discontinued        1 g 200 mL/hr over 30 Minutes Intravenous Every 24 hours 10/29/21 2007 10/29/21 2009   10/29/21 1800  ceFEPIme (MAXIPIME) 2 g in sodium chloride 0.9 % 100 mL IVPB        2 g 200 mL/hr over 30 Minutes Intravenous  Once 10/29/21 1747 10/29/21 1852   10/29/21 1800  metroNIDAZOLE (FLAGYL) IVPB 500 mg        500 mg 100  mL/hr over 60 Minutes Intravenous  Once 10/29/21 1747 10/29/21 2004   10/29/21 1800  vancomycin (VANCOCIN) IVPB 1000 mg/200 mL premix        1,000 mg 200 mL/hr over 60 Minutes Intravenous  Once 10/29/21 1747 10/29/21 2149   10/29/21 1800  vancomycin (VANCOCIN) IVPB 1000 mg/200 mL premix        1,000 mg 200 mL/hr over 60 Minutes Intravenous  Once 10/29/21 1754 10/29/21 2100         Subjective: Candice Camp today has no fevers, no emesis,  No chest pain,   More awake- -Tolerating liquid diet -Remains on IV Levophed drip  Objective: Vitals:   10/31/21 1700 10/31/21 1715 10/31/21 1730 10/31/21 1745  BP: (!) 89/43 (!) 84/48 (!) 95/55 (!) 94/51  Pulse: (!) 102 (!) 105 94 (!) 108  Resp: 20 20 15 18   Temp:      TempSrc:      SpO2:      Weight:      Height:        Intake/Output Summary (Last 24 hours) at 10/31/2021 1757 Last data filed at 10/31/2021 1745 Gross per 24 hour  Intake 4723.65 ml  Output 1743 ml  Net 2980.65 ml   Filed Weights   10/30/21 1635 10/31/21 0400 10/31/21 1500  Weight: 75.4 kg 79.8 kg 79.8 kg     Physical Exam  Gen:-Awake, appears comfortable  Neck-Left neck central line, right neck temporary HD catheter Lungs-bases with bibasilar rhonchi  CV- S1, S2 normal, regular  Abd-  +ve B.Sounds, Abd Soft, No tenderness,    Extremity/Skin:- No  edema, pedal pulses present  NeuroPsych-affect is flat, follows commands  GU-Foley in situ Breast- Healing left breast periareolar wound  data Reviewed: I have personally reviewed following labs and imaging studies  CBC: Recent Labs  Lab 10/29/21 1700 10/29/21 2236 10/30/21 0423  WBC 17.8* 23.9* 24.1*  NEUTROABS 15.6*  --   --   HGB 7.1* 7.6* 7.4*  HCT 21.0* 22.7* 21.5*  MCV 78.4* 80.8 78.5*  PLT 239 246 614   Basic Metabolic Panel: Recent Labs  Lab 10/29/21 2119 10/30/21 0423 10/30/21 0717 10/30/21 1304 10/30/21 1340 10/30/21 1555 10/30/21 2122 10/31/21 0528 10/31/21 0738 10/31/21 1328   NA 126* 128*   128*   < > 127*  --  128* 130* 132*  --  129*  K <2.0* <2.0*   <2.0*   < > 2.5*  --  2.2* 2.7* 2.8*  --  3.0*  CL 99 100   100   < > 102  --  103 100 103  --  104  CO2 11* 12*   12*   < >  11*  --  11* 16* 17*  --  17*  GLUCOSE 181* 184*   184*   < > 168*  --  177* 159* 157*  --  131*  BUN 188* 172*   172*   < > 162*  --  159* 85* 80*  --  73*  CREATININE 10.60* 10.11*   10.09*   < > 9.22*  --  9.68* 5.60* 5.31*  --  5.52*  CALCIUM 7.4* 7.9*   7.9*   < > 7.6*  --  7.6* 7.1* 7.7*  --  7.5*  MG 2.3  --   --   --   --   --   --   --  1.7  --   PHOS  --  3.3  --   --  2.9  --  1.3* 1.2*  --  1.4*   < > = values in this interval not displayed.   GFR: Estimated Creatinine Clearance: 12 mL/min (A) (by C-G formula based on SCr of 5.52 mg/dL (H)). Liver Function Tests: Recent Labs  Lab 10/29/21 1700 10/30/21 0423 10/30/21 1340 10/30/21 2122 10/31/21 0528 10/31/21 1328  AST 24 19  --   --   --   --   ALT 14 10  --   --   --   --   ALKPHOS 104 86  --   --   --   --   BILITOT 0.9 0.8  --   --   --   --   PROT 6.4* 5.2*  --   --   --   --   ALBUMIN 2.3* 1.9* 1.8* 1.8* 1.7* 1.7*   No results for input(s): LIPASE, AMYLASE in the last 168 hours. No results for input(s): AMMONIA in the last 168 hours. Coagulation Profile: Recent Labs  Lab 10/29/21 1751  INR 1.4*   Cardiac Enzymes: No results for input(s): CKTOTAL, CKMB, CKMBINDEX, TROPONINI in the last 168 hours. BNP (last 3 results) No results for input(s): PROBNP in the last 8760 hours. HbA1C: Recent Labs    10/30/21 0423  HGBA1C 5.2   CBG: Recent Labs  Lab 10/30/21 1315 10/30/21 2033 10/31/21 0755 10/31/21 1149 10/31/21 1614  GLUCAP 140* 136* 144* 119* 95   Lipid Profile: No results for input(s): CHOL, HDL, LDLCALC, TRIG, CHOLHDL, LDLDIRECT in the last 72 hours. Thyroid Function Tests: No results for input(s): TSH, T4TOTAL, FREET4, T3FREE, THYROIDAB in the last 72 hours. Anemia Panel: Recent Labs     10/30/21 0423 10/31/21 0528  VITAMINB12  --  829  FOLATE  --  15.8  FERRITIN 1,080*  --   TIBC 115*  --   IRON 81  --    Urine analysis:    Component Value Date/Time   COLORURINE GREEN (A) 10/29/2021 2005   APPEARANCEUR CLOUDY (A) 10/29/2021 2005   LABSPEC 1.020 10/29/2021 2005   PHURINE 6.0 10/29/2021 2005   GLUCOSEU NEGATIVE 10/29/2021 2005   HGBUR SMALL (A) 10/29/2021 2005   [place] NEGATIVE 10/29/2021 2005   KETONESUR 20 (A) 10/29/2021 2005   PROTEINUR 100 (A) 10/29/2021 2005   NITRITE POSITIVE (A) 10/29/2021 2005   LEUKOCYTESUR LARGE (A) 10/29/2021 2005   Sepsis Labs: @LABRCNTIP (procalcitonin:4,lacticidven:4)  ) Recent Results (from the past 240 hour(s))  Resp Panel by RT-PCR (Flu A&B, Covid) Nasopharyngeal Swab     Status: None   Collection Time: 10/29/21  5:27 PM   Specimen: Nasopharyngeal Swab; Nasopharyngeal(NP) swabs in vial transport medium  Result Value Ref  Range Status   SARS Coronavirus 2 by RT PCR NEGATIVE NEGATIVE Final    Comment: (NOTE) SARS-CoV-2 target nucleic acids are NOT DETECTED.  The SARS-CoV-2 RNA is generally detectable in upper respiratory specimens during the acute phase of infection. The lowest concentration of SARS-CoV-2 viral copies this assay can detect is 138 copies/mL. A negative result does not preclude SARS-Cov-2 infection and should not be used as the sole basis for treatment or other patient management decisions. A negative result may occur with  improper specimen collection/handling, submission of specimen other than nasopharyngeal swab, presence of viral mutation(s) within the areas targeted by this assay, and inadequate number of viral copies(<138 copies/mL). A negative result must be combined with clinical observations, patient history, and epidemiological information. The expected result is Negative.  Fact Sheet for Patients:  EntrepreneurPulse.com.au  Fact Sheet for Healthcare Providers:   IncredibleEmployment.be  This test is no t yet approved or cleared by the [place] FDA and  has been authorized for detection and/or diagnosis of SARS-CoV-2 by FDA under an Emergency Use Authorization (EUA). This EUA will remain  in effect (meaning this test can be used) for the duration of the COVID-19 declaration under Section 564(b)(1) of the Act, 21 U.S.C.section 360bbb-3(b)(1), unless the authorization is terminated  or revoked sooner.       Influenza A by PCR NEGATIVE NEGATIVE Final   Influenza B by PCR NEGATIVE NEGATIVE Final    Comment: (NOTE) The Xpert Xpress SARS-CoV-2/FLU/RSV plus assay is intended as an aid in the diagnosis of influenza from Nasopharyngeal swab specimens and should not be used as a sole basis for treatment. Nasal washings and aspirates are unacceptable for Xpert Xpress SARS-CoV-2/FLU/RSV testing.  Fact Sheet for Patients: EntrepreneurPulse.com.au  Fact Sheet for Healthcare Providers: IncredibleEmployment.be  This test is not yet approved or cleared by the [place] FDA and has been authorized for detection and/or diagnosis of SARS-CoV-2 by FDA under an Emergency Use Authorization (EUA). This EUA will remain in effect (meaning this test can be used) for the duration of the COVID-19 declaration under Section 564(b)(1) of the Act, 21 U.S.C. section 360bbb-3(b)(1), unless the authorization is terminated or revoked.  Performed at [facility], [address] [place], [place] [address]   Culture, blood (routine x 2)     Status: None (Preliminary result)   Collection Time: 10/29/21  5:51 PM   Specimen: BLOOD  Result Value Ref Range Status   Specimen Description BLOOD BLOOD RIGHT ARM  Final   Special Requests   Final    BOTTLES DRAWN AEROBIC AND ANAEROBIC Blood Culture adequate volume   Culture   Final    NO GROWTH 2 DAYS Performed at [facility], [address] [place], [place]  [address]    Report Status PENDING  Incomplete  Culture, blood (routine x 2)     Status: None (Preliminary result)   Collection Time: 10/29/21  9:19 PM   Specimen: BLOOD  Result Value Ref Range Status   Specimen Description BLOOD central line  Final   Special Requests   Final    BOTTLES DRAWN AEROBIC AND ANAEROBIC Blood Culture adequate volume   Culture   Final    NO GROWTH 2 DAYS Performed at [facility], [address], [place], [place] [address]    Report Status PENDING  Incomplete  MRSA Next Gen by PCR, Nasal     Status: Abnormal   Collection Time: 10/30/21  1:52 AM   Specimen: Nasal Mucosa; Nasal Swab  Result Value Ref Range Status   MRSA by PCR Next Gen DETECTED (A) NOT DETECTED Final    Comment: RESULT CALLED TO, READ BACK BY AND VERIFIED WITH: ASHLEY @ 1055 ON [id number] BY HENDERSON L (NOTE) The GeneXpert MRSA Assay (FDA approved for NASAL specimens only), is one component of a comprehensive MRSA colonization surveillance program. It is not intended to diagnose MRSA infection nor to guide or monitor treatment for MRSA infections. Test performance is not FDA approved in patients less than 32 years old. Performed at [facility], [address], [place], [place] [address]   Gastrointestinal Panel by PCR , Stool     Status: None   Collection Time: 10/30/21  5:35 PM   Specimen: Rectum; Stool  Result Value Ref Range Status   Campylobacter species NOT DETECTED NOT DETECTED Final   Plesimonas shigelloides NOT DETECTED NOT DETECTED Final   Salmonella species NOT DETECTED NOT DETECTED Final   Yersinia enterocolitica NOT DETECTED NOT DETECTED Final   Vibrio species NOT DETECTED NOT DETECTED Final   Vibrio cholerae NOT DETECTED NOT DETECTED Final   Enteroaggregative E coli (EAEC) NOT DETECTED NOT DETECTED Final   Enteropathogenic E coli (EPEC) NOT DETECTED NOT DETECTED Final   Enterotoxigenic E coli (ETEC) NOT DETECTED NOT DETECTED Final   Shiga like toxin producing E coli (STEC) NOT  DETECTED NOT DETECTED Final   Shigella/Enteroinvasive E coli (EIEC) NOT DETECTED NOT DETECTED Final   Cryptosporidium NOT DETECTED NOT DETECTED Final   Cyclospora cayetanensis NOT DETECTED NOT DETECTED Final   Entamoeba histolytica NOT DETECTED NOT DETECTED Final   Giardia lamblia NOT DETECTED NOT DETECTED Final   Adenovirus F40/41 NOT DETECTED NOT DETECTED Final   Astrovirus NOT DETECTED NOT DETECTED Final   Norovirus GI/GII NOT DETECTED NOT DETECTED Final   Rotavirus A NOT DETECTED NOT DETECTED Final   Sapovirus (I, II, IV, and V) NOT DETECTED NOT DETECTED Final    Comment: Performed at [facility], [address] [place], [place] [address]  C Difficile Quick Screen w PCR reflex     Status: None   Collection Time: 10/30/21  5:35 PM   Specimen: Rectum; Stool  Result Value Ref Range Status   C Diff antigen NEGATIVE NEGATIVE Final   C Diff toxin NEGATIVE NEGATIVE Final   C Diff interpretation No C. difficile detected.  Final    Comment: Performed at [facility], [address], [place],  [address]    Radiology Studies: CT ABDOMEN PELVIS WO CONTRAST  Result Date: 10/29/2021 CLINICAL DATA:  Sepsis EXAM: CT ABDOMEN AND PELVIS WITHOUT CONTRAST TECHNIQUE: Multidetector CT imaging of the abdomen and pelvis was performed following the standard protocol without IV contrast. COMPARISON:  08/26/2021 FINDINGS: Lower chest: Elevation of the right hemidiaphragm. Right base atelectasis or infiltrate. Left lung base clear. Hepatobiliary: Small layering gallstones within the gallbladder. Mild distention of the gallbladder. No focal hepatic abnormality. Pancreas: No focal abnormality or ductal dilatation. Spleen: No focal abnormality.  Normal size. Adrenals/Urinary Tract: Stable left adrenal adenoma. Dysplastic right kidney. No stones or hydronephrosis on the left. No renal mass. Foley catheter present in the bladder which is decompressed. Stomach/Bowel: Normal appendix. Sigmoid  diverticulosis. No active diverticulitis. Stomach and small bowel decompressed, unremarkable. Vascular/Lymphatic: No evidence of aneurysm or adenopathy. Reproductive: Uterus and adnexa unremarkable.  No mass. Other: No free fluid or free air. Musculoskeletal: No acute bony abnormality. IMPRESSION: Elevation of the right hemidiaphragm with right base atelectasis or infiltrate. Sigmoid diverticulosis.  No active  diverticulitis. Cholelithiasis. Gallbladder mildly distended. This could be further evaluated with ultrasound if felt clinically indicated. Electronically Signed   By: Rolm Baptise M.D.   On: 10/29/2021 22:11   DG Chest Port 1 View  Result Date: 10/30/2021 CLINICAL DATA:  Dialysis catheter placement EXAM: PORTABLE CHEST 1 VIEW COMPARISON:  Yesterday FINDINGS: Left internal jugular line tip at high right atrium. Placement of a right internal jugular dialysis catheter with tip at mid right atrium. Midline trachea. Mild cardiomegaly. No pleural effusion or pneumothorax. Lung volumes are low. Moderate right hemidiaphragm elevation with mild volume loss in the adjacent right lung base. Minimal subsegmental atelectasis at the left lung base. IMPRESSION: No pneumothorax after right internal jugular line dialysis catheter placement. Cardiomegaly without congestive failure. Electronically Signed   By: Abigail Miyamoto M.D.   On: 10/30/2021 13:12   DG Chest Portable 1 View  Result Date: 10/29/2021 CLINICAL DATA:  IJ line placement EXAM: PORTABLE CHEST 1 VIEW COMPARISON:  10/29/2021 FINDINGS: Left internal jugular central line is in place with the tip at the cavoatrial junction. Elevation of the right hemidiaphragm with right base atelectasis/infiltrate. Left lung clear. No effusions or pneumothorax. No acute bony abnormality. IMPRESSION: Left central line tip at the cavoatrial junction.  No pneumothorax. Stable elevation of the right hemidiaphragm with right base atelectasis or infiltrate. Electronically Signed    By: Rolm Baptise M.D.   On: 10/29/2021 22:06   DG Chest Portable 1 View  Result Date: 10/29/2021 CLINICAL DATA:  Hypotension.  Abnormal labs. EXAM: PORTABLE CHEST 1 VIEW COMPARISON:  Chest radiograph dated August 26, 2021 FINDINGS: The heart size and mediastinal contours are within normal limits. Low lung volumes. Right basilar atelectasis/infiltrate. Advanced bilateral glenohumeral osteoarthritis. IMPRESSION: 1.  Low lung volumes with right basilar atelectasis/infiltrate. 2.  Advanced chronic bilateral glenohumeral osteoarthritis. Electronically Signed   By: Keane Police D.O.   On: 10/29/2021 18:18     Scheduled Meds:  Chlorhexidine Gluconate Cloth  6 each Topical Daily   Chlorhexidine Gluconate Cloth  6 each Topical Q0600   Chlorhexidine Gluconate Cloth  6 each Topical Q0600   enoxaparin (LOVENOX) injection  30 mg Subcutaneous Q24H   insulin aspart  0-5 Units Subcutaneous QHS   insulin aspart  0-9 Units Subcutaneous TID WC   mupirocin ointment   Nasal BID   potassium chloride  60 mEq Oral Once   vancomycin variable dose per unstable renal function (pharmacist dosing)   Does not apply See admin instructions   Continuous Infusions:  sodium chloride Stopped (10/29/21 2251)   sodium chloride     sodium chloride     ceFEPime (MAXIPIME) IV 1 g (10/30/21 2008)   lactated ringers     norepinephrine (LEVOPHED) Adult infusion 13 mcg/min (10/31/21 1731)   potassium PHOSPHATE IVPB (in mmol)     vancomycin 750 mg (10/31/21 1740)     LOS: 1 day    Roxan Hockey M.D on 10/31/2021 at 5:57 PM  Go to [email or web address] - for contact info  [facility] - Office  [phone]  If 7PM-7AM, please contact night-coverage [email or web address] Password [facility] 10/31/2021, 5:57 PM

## 2021-10-31 NOTE — Procedures (Signed)
° °  HEMODIALYSIS TREATMENT NOTE:   2.5 hour heparin-free session completed using right IJ temp cath.  No fluid removed yet pt remained hypotensive.  Levophed infusion was increased twice per titration parameters; currently infusing at 13 mcg.  All blood was returned.  Net ++ 582 cc.   Rockwell Alexandria, RN

## 2021-11-01 LAB — CBC
HCT: 21 % — ABNORMAL LOW (ref 36.0–46.0)
Hemoglobin: 7.1 g/dL — ABNORMAL LOW (ref 12.0–15.0)
MCH: 26.8 pg (ref 26.0–34.0)
MCHC: 33.8 g/dL (ref 30.0–36.0)
MCV: 79.2 fL — ABNORMAL LOW (ref 80.0–100.0)
Platelets: 162 10*3/uL (ref 150–400)
RBC: 2.65 MIL/uL — ABNORMAL LOW (ref 3.87–5.11)
RDW: 18.8 % — ABNORMAL HIGH (ref 11.5–15.5)
WBC: 19.2 10*3/uL — ABNORMAL HIGH (ref 4.0–10.5)
nRBC: 0 % (ref 0.0–0.2)

## 2021-11-01 LAB — RENAL FUNCTION PANEL
Albumin: 1.7 g/dL — ABNORMAL LOW (ref 3.5–5.0)
Anion gap: 10 (ref 5–15)
BUN: 34 mg/dL — ABNORMAL HIGH (ref 6–20)
CO2: 22 mmol/L (ref 22–32)
Calcium: 7.6 mg/dL — ABNORMAL LOW (ref 8.9–10.3)
Chloride: 103 mmol/L (ref 98–111)
Creatinine, Ser: 2.98 mg/dL — ABNORMAL HIGH (ref 0.44–1.00)
GFR, Estimated: 18 mL/min — ABNORMAL LOW (ref >60)
Glucose, Bld: 149 mg/dL — ABNORMAL HIGH (ref 70–99)
Phosphorus: 1.5 mg/dL — ABNORMAL LOW (ref 2.5–4.6)
Potassium: 3.9 mmol/L (ref 3.5–5.1)
Sodium: 135 mmol/L (ref 135–145)

## 2021-11-01 LAB — GLUCOSE, CAPILLARY
Glucose-Capillary: 151 mg/dL — ABNORMAL HIGH (ref 70–99)
Glucose-Capillary: 142 mg/dL — ABNORMAL HIGH (ref 70–99)
Glucose-Capillary: 124 mg/dL — ABNORMAL HIGH (ref 70–99)
Glucose-Capillary: 55 mg/dL — ABNORMAL LOW (ref 70–99)
Glucose-Capillary: 71 mg/dL (ref 70–99)
Glucose-Capillary: 123 mg/dL — ABNORMAL HIGH (ref 70–99)

## 2021-11-01 LAB — PREPARE RBC (CROSSMATCH)

## 2021-11-01 MED ORDER — SODIUM CHLORIDE 0.9% IV SOLUTION: Freq: Once | INTRAVENOUS | Status: DC

## 2021-11-01 MED ORDER — POTASSIUM PHOSPHATES 15 MMOLE/5ML IV SOLN
30.0000 mmol | Freq: Once | INTRAVENOUS | Status: AC
  Administered 2021-11-01: 10:00:00 30 mmol via INTRAVENOUS
  Filled 2021-11-01: qty 10

## 2021-11-01 NOTE — Progress Notes (Signed)
PROGRESS NOTE     Courtney Dyer, is a 55 y.o. female, DOB - [birth year], ZOX:[id number]  Admit date - 10/29/2021   Admitting Physician Bernadette Hoit, DO  Outpatient Primary MD for the patient is Pcp, No  LOS - 2  No chief complaint on file.     Altered mentation  Brief Narrative:  55 y.o. female with medical history significant for hyperlipidemia, T2DM, gout, GERD , HTN, CKD IV admitted on 10/30/2021 with AKI on CKD stage IV, severe electrolyte derangement, and persistent  severe dehydration partly due to persistent nausea and vomiting with persistent hypotension requiring IV fluids on IV Levophed for pressure support in the setting of sepsis and dehydration  Assessment & Plan:   Principal Problem:   Sepsis secondary to UTI [facility]) Active Problems:   Acute kidney injury ([place])   Hypokalemia   Symptomatic anemia   Hyponatremia   High anion gap metabolic acidosis   Dehydration   Leukocytosis   Microcytic anemia   Hypoalbuminemia due to protein-calorie malnutrition (HCC)   Acute metabolic encephalopathy   Mixed hyperlipidemia   Type 2 diabetes mellitus (HCC)   Gout   Prolonged QT interval   Obesity, Class III, BMI 40-49.9 (morbid obesity) ([place])   1)Persistent hypotension/hemodynamic instability--- in the setting of dehydration and presumed sepsis -continues to require IV Levophed for pressure support (levophed at 14mg /hr) -Continues to require IV fluids -continue IV vancomycin and cefepime pending further culture data--- there is concern for both UTI and pneumonia -Reluctant to switch from vancomycin to daptomycin  as IV vancomycin has better pulmonary coverage  2)AKi on CKD IV with anion gap metabolic acidosis--- suspect ATN --patient with significant dehydration due to persistent nausea and vomiting, also suspect kidney injury secondary to persistent hypotension - CT a/p with dysplastic right kidney and no stones or hydronephrosis on the left.   creatinine was around  10, BUN around 160 -- Consult appreciated, right neck HD catheter placed,- - Scr 2.98 after serial back to back Hemodialysis 10/30/21 and 10/31/21 after hemodialysis  3) acute on chronic anemia--- no evidence of ongoing bleeding suspect some component of chronic anemia of CKD -Serum iron is 81 iron saturation 71, ferritin is not low,  -B12 and folate are not low -Hgb down to 7.1, ok to  transfuse 1 unit of PRBC for total of 2 units of PRBC this admission  4)Chronic breast wound--- does not appear infected, Healing left breast periareolar wound--please see ultrasound report from 10/27/2021  5)Severe Hypokalemia/hyponatremia/hypophosphatemia --potassium was persistently less than 2 despite replacement -magnesium WNL -Replace phosphorus and potassium -Electrolytes improved after hemodialysis session  6)DM2- A1C 5.2 reflecting excellent diabetic control PTA -Use Novolog/Humalog Sliding scale insulin with Accu-Cheks/Fingersticks as ordered   7)Social/Ethics--patient's aunt [name] is primary contact and decision maker [phone]) -Patient remains a full code  8)Sepsis with Septic Shock--- suspect related to UTI and pneumonia -PCT 0.66 -WBC 24.1>>19.2 -Continue IV cefepime and vancomycin pending further culture data -Reluctant to switch from vancomycin to daptomycin  as IV vancomycin has better pulmonary coverage  9) Acute metabolic encephalopathy secondary to sepsis with septic shock and persistent hypotension,, dehydration, worsening renal function and electrolyte and metabolic derangement as above -Improved with hemodialysis and treatment as above  10)Diarrhea-- C diff and GI pathogen/culture is Negative  CRITICAL CARE Performed by: Roxan Hockey  Total critical care time: 48 minutes  Critical care time was exclusive of separately billable procedures and treating other patients.  -Persistent hypotension requiring IV Levophed at 14mg /hr for  pressure  support -Severe electrolyte derangements requiring emergent hemodialysis and correction  Critical care was necessary to treat or prevent imminent or life-threatening deterioration.  Critical care was time spent personally by me on the following activities: development of treatment plan with patient and/or surrogate as well as nursing, discussions with consultants, evaluation of patient's response to treatment, examination of patient, obtaining history from patient or surrogate, ordering and performing treatments and interventions, ordering and review of laboratory studies, ordering and review of radiographic studies, pulse oximetry and re-evaluation of patient's condition.  Disposition/Need for in-Hospital Stay- patient unable to be discharged at this time due to ---Persistent hypotension requiring IV Levophed for pressure support -Severe electrolyte derangements requiring emergent hemodialysis -Patient will return to [facility] SNF once medically improved  Status is: Inpatient  Remains inpatient appropriate because: As above  Disposition: The patient is from: SNF              Anticipated d/c is to: SNF              Anticipated d/c date is: > 3 days              Patient currently is not medically stable to d/c. Barriers: Not Clinically Stable-   Code Status :  -  Code Status: Full Code   Family Communication:   patient's aunt [name] is primary contact and decision maker [phone])  Consults  : Nephrology/general surgery  DVT Prophylaxis  :   - SCDs  enoxaparin (LOVENOX) injection 30 mg Start: 10/30/21 1000 SCDs Start: 10/30/21 0152  Lab Results  Component Value Date   PLT 162 11/01/2021    Inpatient Medications  Scheduled Meds:  Chlorhexidine Gluconate Cloth  6 each Topical Daily   Chlorhexidine Gluconate Cloth  6 each Topical Q0600   Chlorhexidine Gluconate Cloth  6 each Topical Q0600   enoxaparin (LOVENOX) injection  30 mg Subcutaneous Q24H   insulin aspart   0-5 Units Subcutaneous QHS   insulin aspart  0-9 Units Subcutaneous TID WC   mupirocin ointment   Nasal BID   vancomycin variable dose per unstable renal function (pharmacist dosing)   Does not apply See admin instructions   Continuous Infusions:  sodium chloride Stopped (10/29/21 2251)   sodium chloride     sodium chloride     ceFEPime (MAXIPIME) IV 1 g (10/31/21 1936)   lactated ringers 75 mL/hr at 10/31/21 1831   norepinephrine (LEVOPHED) Adult infusion 14 mcg/min (11/01/21 1419)   potassium PHOSPHATE IVPB (in mmol) 30 mmol (11/01/21 0948)   PRN Meds:.sodium chloride, sodium chloride, alteplase, heparin, pentafluoroprop-tetrafluoroeth, prochlorperazine   Anti-infectives (From admission, onward)    Start     Dose/Rate Route Frequency Ordered Stop   10/31/21 1400  vancomycin (VANCOCIN) IVPB 750 mg/150 ml premix        750 mg 150 mL/hr over 60 Minutes Intravenous Every Dialysis 10/31/21 0835 10/31/21 1840   10/30/21 2000  vancomycin (VANCOREADY) IVPB 1500 mg/300 mL  Status:  Discontinued        1,500 mg 150 mL/hr over 120 Minutes Intravenous Every 24 hours 10/29/21 1806 10/29/21 2007   10/30/21 2000  ceFEPIme (MAXIPIME) 1 g in sodium chloride 0.9 % 100 mL IVPB        1 g 200 mL/hr over 30 Minutes Intravenous Every 24 hours 10/29/21 2009 11/05/21 1959   10/30/21 1600  vancomycin (VANCOREADY) IVPB 750 mg/150 mL        750 mg 150 mL/hr over 60 Minutes  Intravenous Every M-W-F (Hemodialysis) 10/30/21 1146 10/30/21 2006   10/30/21 1136  vancomycin variable dose per unstable renal function (pharmacist dosing)         Does not apply See admin instructions 10/30/21 1137     10/30/21 0400  ceFEPIme (MAXIPIME) 2 g in sodium chloride 0.9 % 100 mL IVPB  Status:  Discontinued        2 g 200 mL/hr over 30 Minutes Intravenous Every 8 hours 10/29/21 1806 10/29/21 2007   10/29/21 2015  ceFEPIme (MAXIPIME) 1 g in sodium chloride 0.9 % 100 mL IVPB  Status:  Discontinued        1 g 200 mL/hr over  30 Minutes Intravenous Every 24 hours 10/29/21 2007 10/29/21 2009   10/29/21 1800  ceFEPIme (MAXIPIME) 2 g in sodium chloride 0.9 % 100 mL IVPB        2 g 200 mL/hr over 30 Minutes Intravenous  Once 10/29/21 1747 10/29/21 1852   10/29/21 1800  metroNIDAZOLE (FLAGYL) IVPB 500 mg        500 mg 100 mL/hr over 60 Minutes Intravenous  Once 10/29/21 1747 10/29/21 2004   10/29/21 1800  vancomycin (VANCOCIN) IVPB 1000 mg/200 mL premix        1,000 mg 200 mL/hr over 60 Minutes Intravenous  Once 10/29/21 1747 10/29/21 2149   10/29/21 1800  vancomycin (VANCOCIN) IVPB 1000 mg/200 mL premix        1,000 mg 200 mL/hr over 60 Minutes Intravenous  Once 10/29/21 1754 10/29/21 2100       Subjective: Candice Camp today has no fevers, no emesis,  No chest pain,   More awake---talking and answering questions  -Tolerating liquid diet -Remains on IV Levophed drip  Objective: Vitals:   11/01/21 1108 11/01/21 1200 11/01/21 1300 11/01/21 1400  BP:  (!) 103/50 (!) 99/56 96/63  Pulse: (!) 114 95 (!) 102 100  Resp: 14 (!) 6 (!) 9 12  Temp: 99.7 F (37.6 C) 99.9 F (37.7 C) 99.7 F (37.6 C) 99.7 F (37.6 C)  TempSrc: Bladder     SpO2: 100% 100% 100% 100%  Weight:      Height:        Intake/Output Summary (Last 24 hours) at 11/01/2021 1445 Last data filed at 11/01/2021 0600 Gross per 24 hour  Intake 7010.78 ml  Output 818 ml  Net 6192.78 ml   Filed Weights   10/30/21 1635 10/31/21 0400 10/31/21 1500  Weight: 75.4 kg 79.8 kg 79.8 kg    Physical Exam  Gen:-Awake, appears comfortable  Neck-Left neck central line, right neck temporary HD catheter Lungs-bases with bibasilar rhonchi  CV- S1, S2 normal, regular  Abd-  +ve B.Sounds, Abd Soft, No tenderness,    Extremity/Skin:- No  edema, pedal pulses present  NeuroPsych-affect is flat, follows commands  GU-Foley in situ Breast- Healing left breast periareolar wound  data Reviewed: I have personally reviewed following labs and imaging  studies  CBC: Recent Labs  Lab 10/29/21 1700 10/29/21 2236 10/30/21 0423 11/01/21 0432  WBC 17.8* 23.9* 24.1* 19.2*  NEUTROABS 15.6*  --   --   --   HGB 7.1* 7.6* 7.4* 7.1*  HCT 21.0* 22.7* 21.5* 21.0*  MCV 78.4* 80.8 78.5* 79.2*  PLT 239 246 239 841   Basic Metabolic Panel: Recent Labs  Lab 10/29/21 2119 10/30/21 0423 10/30/21 1340 10/30/21 1555 10/30/21 2122 10/31/21 0528 10/31/21 0738 10/31/21 1328 11/01/21 0432  NA 126*   < >  --  128*  130* 132*  --  129* 135  K <2.0*   < >  --  2.2* 2.7* 2.8*  --  3.0* 3.9  CL 99   < >  --  103 100 103  --  104 103  CO2 11*   < >  --  11* 16* 17*  --  17* 22  GLUCOSE 181*   < >  --  177* 159* 157*  --  131* 149*  BUN 188*   < >  --  159* 85* 80*  --  73* 34*  CREATININE 10.60*   < >  --  9.68* 5.60* 5.31*  --  5.52* 2.98*  CALCIUM 7.4*   < >  --  7.6* 7.1* 7.7*  --  7.5* 7.6*  MG 2.3  --   --   --   --   --  1.7  --   --   PHOS  --    < > 2.9  --  1.3* 1.2*  --  1.4* 1.5*   < > = values in this interval not displayed.   GFR: Estimated Creatinine Clearance: 22.3 mL/min (A) (by C-G formula based on SCr of 2.98 mg/dL (H)). Liver Function Tests: Recent Labs  Lab 10/29/21 1700 10/30/21 0423 10/30/21 1340 10/30/21 2122 10/31/21 0528 10/31/21 1328 11/01/21 0432  AST 24 19  --   --   --   --   --   ALT 14 10  --   --   --   --   --   ALKPHOS 104 86  --   --   --   --   --   BILITOT 0.9 0.8  --   --   --   --   --   PROT 6.4* 5.2*  --   --   --   --   --   ALBUMIN 2.3* 1.9* 1.8* 1.8* 1.7* 1.7* 1.7*   No results for input(s): LIPASE, AMYLASE in the last 168 hours. No results for input(s): AMMONIA in the last 168 hours. Coagulation Profile: Recent Labs  Lab 10/29/21 1751  INR 1.4*   Cardiac Enzymes: No results for input(s): CKTOTAL, CKMB, CKMBINDEX, TROPONINI in the last 168 hours. BNP (last 3 results) No results for input(s): PROBNP in the last 8760 hours. HbA1C: Recent Labs    10/30/21 0423  HGBA1C 5.2    CBG: Recent Labs  Lab 10/31/21 1149 10/31/21 1614 10/31/21 2255 11/01/21 0714 11/01/21 1107  GLUCAP 119* 95 134* 151* 142*   Lipid Profile: No results for input(s): CHOL, HDL, LDLCALC, TRIG, CHOLHDL, LDLDIRECT in the last 72 hours. Thyroid Function Tests: No results for input(s): TSH, T4TOTAL, FREET4, T3FREE, THYROIDAB in the last 72 hours. Anemia Panel: Recent Labs    10/30/21 0423 10/31/21 0528  VITAMINB12  --  829  FOLATE  --  15.8  FERRITIN 1,080*  --   TIBC 115*  --   IRON 81  --    Urine analysis:    Component Value Date/Time   COLORURINE GREEN (A) 10/29/2021 2005   APPEARANCEUR CLOUDY (A) 10/29/2021 2005   LABSPEC 1.020 10/29/2021 2005   PHURINE 6.0 10/29/2021 2005   GLUCOSEU NEGATIVE 10/29/2021 2005   HGBUR SMALL (A) 10/29/2021 2005   BILIRUBINUR NEGATIVE 10/29/2021 2005   KETONESUR 20 (A) 10/29/2021 2005   PROTEINUR 100 (A) 10/29/2021 2005   NITRITE POSITIVE (A) 10/29/2021 2005   LEUKOCYTESUR LARGE (A) 10/29/2021 2005   Sepsis Labs: @LABRCNTIP (procalcitonin:4,lacticidven:4)  )  Recent Results (from the past 240 hour(s))  Resp Panel by RT-PCR (Flu A&B, Covid) Nasopharyngeal Swab     Status: None   Collection Time: 10/29/21  5:27 PM   Specimen: Nasopharyngeal Swab; Nasopharyngeal(NP) swabs in vial transport medium  Result Value Ref Range Status   SARS Coronavirus 2 by RT PCR NEGATIVE NEGATIVE Final    Comment: (NOTE) SARS-CoV-2 target nucleic acids are NOT DETECTED.  The SARS-CoV-2 RNA is generally detectable in upper respiratory specimens during the acute phase of infection. The lowest concentration of SARS-CoV-2 viral copies this assay can detect is 138 copies/mL. A negative result does not preclude SARS-Cov-2 infection and should not be used as the sole basis for treatment or other patient management decisions. A negative result may occur with  improper specimen collection/handling, submission of specimen other than nasopharyngeal swab,  presence of viral mutation(s) within the areas targeted by this assay, and inadequate number of viral copies(<138 copies/mL). A negative result must be combined with clinical observations, patient history, and epidemiological information. The expected result is Negative.  Fact Sheet for Patients:  EntrepreneurPulse.com.au  Fact Sheet for Healthcare Providers:  IncredibleEmployment.be  This test is no t yet approved or cleared by the [place] FDA and  has been authorized for detection and/or diagnosis of SARS-CoV-2 by FDA under an Emergency Use Authorization (EUA). This EUA will remain  in effect (meaning this test can be used) for the duration of the COVID-19 declaration under Section 564(b)(1) of the Act, 21 U.S.C.section 360bbb-3(b)(1), unless the authorization is terminated  or revoked sooner.       Influenza A by PCR NEGATIVE NEGATIVE Final   Influenza B by PCR NEGATIVE NEGATIVE Final    Comment: (NOTE) The Xpert Xpress SARS-CoV-2/FLU/RSV plus assay is intended as an aid in the diagnosis of influenza from Nasopharyngeal swab specimens and should not be used as a sole basis for treatment. Nasal washings and aspirates are unacceptable for Xpert Xpress SARS-CoV-2/FLU/RSV testing.  Fact Sheet for Patients: EntrepreneurPulse.com.au  Fact Sheet for Healthcare Providers: IncredibleEmployment.be  This test is not yet approved or cleared by the [place] FDA and has been authorized for detection and/or diagnosis of SARS-CoV-2 by FDA under an Emergency Use Authorization (EUA). This EUA will remain in effect (meaning this test can be used) for the duration of the COVID-19 declaration under Section 564(b)(1) of the Act, 21 U.S.C. section 360bbb-3(b)(1), unless the authorization is terminated or revoked.  Performed at [facility], [address], [place], [place] [address]   Culture, blood (routine x  2)     Status: None (Preliminary result)   Collection Time: 10/29/21  5:51 PM   Specimen: BLOOD  Result Value Ref Range Status   Specimen Description BLOOD BLOOD RIGHT ARM  Final   Special Requests   Final    BOTTLES DRAWN AEROBIC AND ANAEROBIC Blood Culture adequate volume   Culture   Final    NO GROWTH 2 DAYS Performed at [facility], [address] [place], [place] [address]    Report Status PENDING  Incomplete  Culture, blood (routine x 2)     Status: None (Preliminary result)   Collection Time: 10/29/21  9:19 PM   Specimen: BLOOD  Result Value Ref Range Status   Specimen Description BLOOD central line  Final   Special Requests   Final    BOTTLES DRAWN AEROBIC AND ANAEROBIC Blood Culture adequate volume   Culture   Final    NO GROWTH 2 DAYS Performed at  [facility], [address], [place], [place] [address]    Report Status PENDING  Incomplete  MRSA Next Gen by PCR, Nasal     Status: Abnormal   Collection Time: 10/30/21  1:52 AM   Specimen: Nasal Mucosa; Nasal Swab  Result Value Ref Range Status   MRSA by PCR Next Gen DETECTED (A) NOT DETECTED Final    Comment: RESULT CALLED TO, READ BACK BY AND VERIFIED WITH: ASHLEY @ 1055 ON [id number] BY HENDERSON L (NOTE) The GeneXpert MRSA Assay (FDA approved for NASAL specimens only), is one component of a comprehensive MRSA colonization surveillance program. It is not intended to diagnose MRSA infection nor to guide or monitor treatment for MRSA infections. Test performance is not FDA approved in patients less than 85 years old. Performed at [facility], [address] [place], [place] [address]   Gastrointestinal Panel by PCR , Stool     Status: None   Collection Time: 10/30/21  5:35 PM   Specimen: Rectum; Stool  Result Value Ref Range Status   Campylobacter species NOT DETECTED NOT DETECTED Final   Plesimonas shigelloides NOT DETECTED NOT DETECTED Final   Salmonella species NOT DETECTED NOT DETECTED Final   Yersinia  enterocolitica NOT DETECTED NOT DETECTED Final   Vibrio species NOT DETECTED NOT DETECTED Final   Vibrio cholerae NOT DETECTED NOT DETECTED Final   Enteroaggregative E coli (EAEC) NOT DETECTED NOT DETECTED Final   Enteropathogenic E coli (EPEC) NOT DETECTED NOT DETECTED Final   Enterotoxigenic E coli (ETEC) NOT DETECTED NOT DETECTED Final   Shiga like toxin producing E coli (STEC) NOT DETECTED NOT DETECTED Final   Shigella/Enteroinvasive E coli (EIEC) NOT DETECTED NOT DETECTED Final   Cryptosporidium NOT DETECTED NOT DETECTED Final   Cyclospora cayetanensis NOT DETECTED NOT DETECTED Final   Entamoeba histolytica NOT DETECTED NOT DETECTED Final   Giardia lamblia NOT DETECTED NOT DETECTED Final   Adenovirus F40/41 NOT DETECTED NOT DETECTED Final   Astrovirus NOT DETECTED NOT DETECTED Final   Norovirus GI/GII NOT DETECTED NOT DETECTED Final   Rotavirus A NOT DETECTED NOT DETECTED Final   Sapovirus (I, II, IV, and V) NOT DETECTED NOT DETECTED Final    Comment: Performed at [facility], [address] [place], [place] [address]  C Difficile Quick Screen w PCR reflex     Status: None   Collection Time: 10/30/21  5:35 PM   Specimen: Rectum; Stool  Result Value Ref Range Status   C Diff antigen NEGATIVE NEGATIVE Final   C Diff toxin NEGATIVE NEGATIVE Final   C Diff interpretation No C. difficile detected.  Final    Comment: Performed at [facility], [address] [place], [place] [address]    Radiology Studies: No results found.   Scheduled Meds:  Chlorhexidine Gluconate Cloth  6 each Topical Daily   Chlorhexidine Gluconate Cloth  6 each Topical Q0600   Chlorhexidine Gluconate Cloth  6 each Topical Q0600   enoxaparin (LOVENOX) injection  30 mg Subcutaneous Q24H   insulin aspart  0-5 Units Subcutaneous QHS   insulin aspart  0-9 Units Subcutaneous TID WC   mupirocin ointment   Nasal BID   vancomycin variable dose per unstable renal function (pharmacist dosing)   Does not  apply See admin instructions   Continuous Infusions:  sodium chloride Stopped (10/29/21 2251)   sodium chloride     sodium chloride     ceFEPime (MAXIPIME) IV 1 g (10/31/21 1936)   lactated ringers 75  mL/hr at 10/31/21 1831   norepinephrine (LEVOPHED) Adult infusion 14 mcg/min (11/01/21 1419)   potassium PHOSPHATE IVPB (in mmol) 30 mmol (11/01/21 0948)    LOS: 2 days   Roxan Hockey M.D on 11/01/2021 at 2:45 PM  Go to [email or web address] - for contact info  [facility] - Office  [phone]  If 7PM-7AM, please contact night-coverage [email or web address] Password [email or web address] 11/01/2021, 2:45 PM

## 2021-11-01 NOTE — Progress Notes (Signed)
55 year old lady diabetes, gout, prior nephrectomy.  History of stage III chronic kidney disease who was admitted with persistent hypotension and hemodynamic instability.  She was admitted with azotemia and metabolic acidosis.  It appears that she has acute kidney injury with a peak creatinine 12.87 mg/dL 10/29/2021.  She does have urine output with 1.4 L removed 10/31/2021  She underwent placement of right neck HD cath and hemodialysis was initiated 10/30/2021 and 10/31/2021.  Serologies pending including SPEP and urine protein electrophoresis.  Urine sediment did not show any activity although she did have 100 mg/dL protein.  Blood pressure 103/53 pulse 103 temperature 99 O2 sats 100%  IV norepinephrine IV lactated Ringer's IV cefepime IV vancomycin  Sodium 135 potassium 3.9 chloride 103 CO2 22 BUN 34 creatinine 2.98 glucose 149 calcium 7.6 phosphorus 1.5 albumin 1.7 hemoglobin 7.1.  Iron saturation 71%  1.  Replete phosphorus 2.  Continue to monitor for recovery 3.  We will add darbepoetin 4.  Consider changing vancomycin to less nephrotoxic agent.

## 2021-11-01 NOTE — Progress Notes (Signed)
Pharmacy Antibiotic Note  Courtney Dyer is a 55 y.o. female admitted on 10/29/2021 with sepsis.  Pharmacy has been consulted for vanc/cefepime dosing.  Pt presented with anemia and unknown sepsis. Vanc/cefepime ordered empirically. Pt is in acute renal failure. Will dose vanc based on level for now.   Scr 2.98, dialysis 12/16 and 12/17  Plan: Vanc 750mg  with dialysis sessions, unknown when next is  Cefepime 1g IV q24h   Height: 5\' 4"  (162.6 cm) Weight: 79.8 kg (175 lb 14.8 oz) IBW/kg (Calculated) : 54.7  Temp (24hrs), Avg:99.2 F (37.3 C), Min:98.2 F (36.8 C), Max:99.9 F (37.7 C)  Recent Labs  Lab 10/29/21 1700 10/29/21 1751 10/29/21 2119 10/29/21 2236 10/30/21 0423 10/30/21 0717 10/30/21 1555 10/30/21 2122 10/31/21 0528 10/31/21 1328 11/01/21 0432  WBC 17.8*  --   --  23.9* 24.1*  --   --   --   --   --  19.2*  CREATININE 12.87*  --  10.60*  --  10.11*   10.09*   < > 9.68* 5.60* 5.31* 5.52* 2.98*  LATICACIDVEN  --  1.4 0.8  --   --   --   --   --   --   --   --    < > = values in this interval not displayed.     Estimated Creatinine Clearance: 22.3 mL/min (A) (by C-G formula based on SCr of 2.98 mg/dL (H)).    No Known Allergies  Antimicrobials this admission: 12/15 vanc>> 12/15 cefepime>> 12/15 flagyl x 1    Microbiology results: 12/15 blood>> NGTD  12/16 MRSA PCR>>positive   Thomasenia Sales, PharmD, [facility] Clinical Pharmacist  11/01/2021 9:48 AM

## 2021-11-01 NOTE — Progress Notes (Signed)
Hypoglycemic Event  CBG: 55   Treatment: 4 oz juice/soda  Symptoms: None  Follow-up CBG: Time:2022 CBG Result:71  Possible Reasons for Event: Inadequate meal intake  Comments/MD notified:Notified O. Adefeso, DO     Marinus Maw

## 2021-11-01 NOTE — Progress Notes (Signed)
Foley catheter clamped & fresh urine sample collected for urine culture, taken to lab

## 2021-11-02 ENCOUNTER — Other Ambulatory Visit: Payer: Self-pay

## 2021-11-02 LAB — URINE CULTURE: Culture: NO GROWTH

## 2021-11-02 LAB — CBC
HCT: 24.9 % — ABNORMAL LOW (ref 36.0–46.0)
Hemoglobin: 8.2 g/dL — ABNORMAL LOW (ref 12.0–15.0)
MCH: 26.9 pg (ref 26.0–34.0)
MCHC: 32.9 g/dL (ref 30.0–36.0)
MCV: 81.6 fL (ref 80.0–100.0)
Platelets: 127 10*3/uL — ABNORMAL LOW (ref 150–400)
RBC: 3.05 MIL/uL — ABNORMAL LOW (ref 3.87–5.11)
RDW: 16.9 % — ABNORMAL HIGH (ref 11.5–15.5)
WBC: 18.8 10*3/uL — ABNORMAL HIGH (ref 4.0–10.5)
nRBC: 0 % (ref 0.0–0.2)

## 2021-11-02 LAB — TYPE AND SCREEN
ABO/RH(D): A POS
Antibody Screen: NEGATIVE
Unit division: 0

## 2021-11-02 LAB — BASIC METABOLIC PANEL
Anion gap: 10 (ref 5–15)
BUN: 32 mg/dL — ABNORMAL HIGH (ref 6–20)
CO2: 21 mmol/L — ABNORMAL LOW (ref 22–32)
Calcium: 7.8 mg/dL — ABNORMAL LOW (ref 8.9–10.3)
Chloride: 103 mmol/L (ref 98–111)
Creatinine, Ser: 3.22 mg/dL — ABNORMAL HIGH (ref 0.44–1.00)
GFR, Estimated: 17 mL/min — ABNORMAL LOW (ref >60)
Glucose, Bld: 138 mg/dL — ABNORMAL HIGH (ref 70–99)
Potassium: 3 mmol/L — ABNORMAL LOW (ref 3.5–5.1)
Sodium: 134 mmol/L — ABNORMAL LOW (ref 135–145)

## 2021-11-02 LAB — GLUCOSE, CAPILLARY
Glucose-Capillary: 141 mg/dL — ABNORMAL HIGH (ref 70–99)
Glucose-Capillary: 145 mg/dL — ABNORMAL HIGH (ref 70–99)
Glucose-Capillary: 120 mg/dL — ABNORMAL HIGH (ref 70–99)
Glucose-Capillary: 168 mg/dL — ABNORMAL HIGH (ref 70–99)

## 2021-11-02 LAB — KAPPA/LAMBDA LIGHT CHAINS
Kappa free light chain: 162.2 mg/L — ABNORMAL HIGH (ref 3.3–19.4)
Kappa, lambda light chain ratio: 1.3 (ref 0.26–1.65)
Lambda free light chains: 125.1 mg/L — ABNORMAL HIGH (ref 5.7–26.3)

## 2021-11-02 LAB — BPAM RBC
Blood Product Expiration Date: 202212262359
ISSUE DATE / TIME: 202212181953
Unit Type and Rh: 600

## 2021-11-02 LAB — PHOSPHORUS: Phosphorus: 3.7 mg/dL (ref 2.5–4.6)

## 2021-11-02 MED ORDER — POTASSIUM CHLORIDE 20 MEQ PO PACK
40.0000 meq | PACK | Freq: Once | ORAL | Status: AC
Start: 2021-11-02 — End: 2021-11-02
  Administered 2021-11-02: 21:00:00 40 meq via ORAL
  Filled 2021-11-02: qty 2

## 2021-11-02 MED ORDER — DIPHENOXYLATE-ATROPINE 2.5-0.025 MG PO TABS
2.0000 | ORAL_TABLET | Freq: Once | ORAL | Status: AC
  Administered 2021-11-02: 12:00:00 2 via ORAL
  Filled 2021-11-02: qty 2

## 2021-11-02 MED ORDER — NEPRO/CARBSTEADY PO LIQD
237.0000 mL | Freq: Three times a day (TID) | ORAL | Status: DC
  Administered 2021-11-02 – 2021-11-07 (×10): 237 mL via ORAL

## 2021-11-02 MED ORDER — LOPERAMIDE HCL 2 MG PO CAPS
2.0000 mg | ORAL_CAPSULE | Freq: Four times a day (QID) | ORAL | Status: DC | PRN
  Administered 2021-11-02 – 2021-11-04 (×4): 2 mg via ORAL
  Filled 2021-11-02 (×4): qty 1

## 2021-11-02 MED ORDER — LINEZOLID 600 MG/300ML IV SOLN
600.0000 mg | Freq: Two times a day (BID) | INTRAVENOUS | Status: AC
  Administered 2021-11-02 – 2021-11-07 (×11): 600 mg via INTRAVENOUS
  Filled 2021-11-02 (×15): qty 300

## 2021-11-02 MED ORDER — SODIUM CHLORIDE 0.9 % IV SOLN
2.0000 g | INTRAVENOUS | Status: AC
  Administered 2021-11-02 – 2021-11-07 (×6): 2 g via INTRAVENOUS
  Filled 2021-11-02 (×6): qty 2

## 2021-11-02 MED ORDER — ENSURE ENLIVE PO LIQD
237.0000 mL | Freq: Two times a day (BID) | ORAL | Status: DC
  Administered 2021-11-02: 17:00:00 237 mL via ORAL

## 2021-11-02 MED ORDER — DARBEPOETIN ALFA 40 MCG/0.4ML IJ SOSY
40.0000 ug | PREFILLED_SYRINGE | INTRAMUSCULAR | Status: DC
  Filled 2021-11-02: qty 0.4

## 2021-11-02 MED ORDER — POTASSIUM CHLORIDE 20 MEQ PO PACK
40.0000 meq | PACK | ORAL | Status: AC
  Administered 2021-11-02 (×2): 40 meq via ORAL
  Filled 2021-11-02 (×2): qty 2

## 2021-11-02 NOTE — Progress Notes (Signed)
PROGRESS NOTE     Courtney Dyer, is a 55 y.o. female, DOB - 1966/04/11, HCW:[id number]  Admit date - 10/29/2021   Admitting Physician Bernadette Hoit, DO  Outpatient Primary MD for the patient is Pcp, No  LOS - 3  No chief complaint on file.     Altered mentation  Brief Narrative:  55 y.o. female with medical history significant for hyperlipidemia, T2DM, gout, GERD , HTN, CKD IV admitted on 10/30/2021 with AKI on CKD stage IV, severe electrolyte derangement, and persistent  severe dehydration partly due to persistent nausea and vomiting with persistent hypotension requiring IV fluids on IV Levophed for pressure support in the setting of sepsis and dehydration  Assessment & Plan:   Principal Problem:   Sepsis secondary to UTI [facility]) Active Problems:   Acute kidney injury ([place])   Hypokalemia   Symptomatic anemia   Hyponatremia   High anion gap metabolic acidosis   Dehydration   Leukocytosis   Microcytic anemia   Hypoalbuminemia due to protein-calorie malnutrition (HCC)   Acute metabolic encephalopathy   Mixed hyperlipidemia   Type 2 diabetes mellitus (HCC)   Gout   Prolonged QT interval   Obesity, Class III, BMI 40-49.9 (morbid obesity) ([place])   1)Persistent Hypotension/hemodynamic instability--- in the setting of dehydration and presumed sepsis -continues to require IV Levophed for pressure support (levophed at 8mg /hr) -Continues to require IV fluids -continue IV vancomycin and cefepime pending further culture data--- there is concern for both UTI and pneumonia -Reluctant to switch from vancomycin to daptomycin  as IV vancomycin has better pulmonary coverage -Stop vancomycin due to kidney concerns, okay to use linezolid  2)AKi on CKD IV with anion gap metabolic acidosis--- suspect ATN --patient with significant dehydration due to persistent nausea and vomiting, also suspect kidney injury secondary to persistent hypotension -Solitary L kidney, hx/o R nephrectomy;  -  CT a/p with dysplastic right kidney and no stones or hydronephrosis on the left.   creatinine was around 10, BUN around 160 -- Consult appreciated, right neck HD catheter placed,- - Scr 2.98 after serial back to back Hemodialysis 10/30/21 and 10/31/21 after hemodialysis -Good urine output  3) acute on chronic anemia--- no evidence of ongoing bleeding suspect some component of chronic anemia of CKD -Serum iron is 81 iron saturation 71, ferritin is not low,  -B12 and folate are not low -Hgb down to 7.1, ok to  transfuse 1 unit of PRBC for total of 2 units of PRBC this admission  4)Chronic breast wound--- does not appear infected, Healing left breast periareolar wound--please see ultrasound report from 10/27/2021  5)Severe Hypokalemia/hyponatremia/hypophosphatemia --potassium was persistently less than 2 despite replacement -magnesium WNL -Replace phosphorus and potassium -Electrolytes improved after hemodialysis session  6)DM2- A1C 5.2 reflecting excellent diabetic control PTA -Use Novolog/Humalog Sliding scale insulin with Accu-Cheks/Fingersticks as ordered   7)Social/Ethics--patient's aunt [name] is primary contact and decision maker [phone]) -Patient remains a full code  8)Sepsis with Septic Shock--- suspect related to UTI and pneumonia -PCT 0.66 -WBC 24.1>>19.2 -Continue IV cefepime and vancomycin pending further culture data -Reluctant to switch from vancomycin to daptomycin  as IV vancomycin has better pulmonary coverage  9) Acute metabolic encephalopathy secondary to sepsis with septic shock and persistent hypotension,, dehydration, worsening renal function and electrolyte and metabolic derangement as above -Improved with hemodialysis and treatment as above  10)Diarrhea-- C diff and GI pathogen/culture is Negative -Rectal tube placed continue IV fluids, replace electrolytes,   CRITICAL CARE Performed by: Roxan Hockey  Total critical care time: 42  minutes  Critical care time was exclusive of separately billable procedures and treating other patients.  -Persistent hypotension requiring IV Levophed at 8mg /hr for pressure support -Severe electrolyte derangements requiring emergent hemodialysis and correction  Critical care was necessary to treat or prevent imminent or life-threatening deterioration.  Critical care was time spent personally by me on the following activities: development of treatment plan with patient and/or surrogate as well as nursing, discussions with consultants, evaluation of patient's response to treatment, examination of patient, obtaining history from patient or surrogate, ordering and performing treatments and interventions, ordering and review of laboratory studies, ordering and review of radiographic studies, pulse oximetry and re-evaluation of patient's condition.  Disposition/Need for in-Hospital Stay- patient unable to be discharged at this time due to ---Persistent hypotension requiring IV Levophed for pressure support -Severe electrolyte derangements requiring emergent hemodialysis -Patient will return [facility] once medically improved  Status is: Inpatient  Remains inpatient appropriate because: As above  Disposition: The patient is from: SNF              Anticipated d/c is to: SNF              Anticipated d/c date is: > 3 days              Patient currently is not medically stable to d/c. Barriers: Not Clinically Stable-   Code Status :  -  Code Status: Full Code   Family Communication:   patient's aunt [name] is primary contact and decision maker [phone])  Consults  : Nephrology/general surgery  DVT Prophylaxis  :   - SCDs  enoxaparin (LOVENOX) injection 30 mg Start: 10/30/21 1000 SCDs Start: 10/30/21 0152  Lab Results  Component Value Date   PLT 127 (L) 11/02/2021    Inpatient Medications  Scheduled Meds:  sodium chloride   Intravenous Once   Chlorhexidine  Gluconate Cloth  6 each Topical Daily   Chlorhexidine Gluconate Cloth  6 each Topical Q0600   Chlorhexidine Gluconate Cloth  6 each Topical Q0600   darbepoetin (ARANESP) injection - DIALYSIS  40 mcg Subcutaneous Q Mon-HD   enoxaparin (LOVENOX) injection  30 mg Subcutaneous Q24H   insulin aspart  0-5 Units Subcutaneous QHS   insulin aspart  0-9 Units Subcutaneous TID WC   mupirocin ointment   Nasal BID   potassium chloride  40 mEq Oral Q3H   vancomycin variable dose per unstable renal function (pharmacist dosing)   Does not apply See admin instructions   Continuous Infusions:  sodium chloride Stopped (10/29/21 2251)   sodium chloride     sodium chloride     ceFEPime (MAXIPIME) IV 1 g (11/01/21 2049)   lactated ringers 75 mL/hr at 11/02/21 0821   norepinephrine (LEVOPHED) Adult infusion 8 mcg/min (11/02/21 0850)   PRN Meds:.sodium chloride, sodium chloride, alteplase, heparin, loperamide, pentafluoroprop-tetrafluoroeth, prochlorperazine   Anti-infectives (From admission, onward)    Start     Dose/Rate Route Frequency Ordered Stop   10/31/21 1400  vancomycin (VANCOCIN) IVPB 750 mg/150 ml premix        750 mg 150 mL/hr over 60 Minutes Intravenous Every Dialysis 10/31/21 0835 10/31/21 1840   10/30/21 2000  vancomycin (VANCOREADY) IVPB 1500 mg/300 mL  Status:  Discontinued        1,500 mg 150 mL/hr over 120 Minutes Intravenous Every 24 hours 10/29/21 1806 10/29/21 2007   10/30/21 2000  ceFEPIme (MAXIPIME) 1 g in sodium chloride 0.9 %  100 mL IVPB        1 g 200 mL/hr over 30 Minutes Intravenous Every 24 hours 10/29/21 2009 11/05/21 1959   10/30/21 1600  vancomycin (VANCOREADY) IVPB 750 mg/150 mL        750 mg 150 mL/hr over 60 Minutes Intravenous Every M-W-F (Hemodialysis) 10/30/21 1146 10/30/21 2006   10/30/21 1136  vancomycin variable dose per unstable renal function (pharmacist dosing)         Does not apply See admin instructions 10/30/21 1137     10/30/21 0400  ceFEPIme  (MAXIPIME) 2 g in sodium chloride 0.9 % 100 mL IVPB  Status:  Discontinued        2 g 200 mL/hr over 30 Minutes Intravenous Every 8 hours 10/29/21 1806 10/29/21 2007   10/29/21 2015  ceFEPIme (MAXIPIME) 1 g in sodium chloride 0.9 % 100 mL IVPB  Status:  Discontinued        1 g 200 mL/hr over 30 Minutes Intravenous Every 24 hours 10/29/21 2007 10/29/21 2009   10/29/21 1800  ceFEPIme (MAXIPIME) 2 g in sodium chloride 0.9 % 100 mL IVPB        2 g 200 mL/hr over 30 Minutes Intravenous  Once 10/29/21 1747 10/29/21 1852   10/29/21 1800  metroNIDAZOLE (FLAGYL) IVPB 500 mg        500 mg 100 mL/hr over 60 Minutes Intravenous  Once 10/29/21 1747 10/29/21 2004   10/29/21 1800  vancomycin (VANCOCIN) IVPB 1000 mg/200 mL premix        1,000 mg 200 mL/hr over 60 Minutes Intravenous  Once 10/29/21 1747 10/29/21 2149   10/29/21 1800  vancomycin (VANCOCIN) IVPB 1000 mg/200 mL premix        1,000 mg 200 mL/hr over 60 Minutes Intravenous  Once 10/29/21 1754 10/29/21 2100       Subjective: Candice Camp today has no fevers, no emesis,  No chest pain,   More awake---talking and answering questions  -Appetite is not great, --Diarrhea persist  Objective: Vitals:   11/02/21 0500 11/02/21 0700 11/02/21 0800 11/02/21 0900  BP: 116/61  122/84   Pulse: 90 97 (!) 106   Resp: (!) 7 15 20    Temp: 99.7 F (37.6 C) 99.9 F (37.7 C) 99.5 F (37.5 C)   TempSrc:      SpO2: 100% 100% 100%   Weight:    87.1 kg  Height:        Intake/Output Summary (Last 24 hours) at 11/02/2021 1016 Last data filed at 11/02/2021 0957 Gross per 24 hour  Intake 4225.73 ml  Output 2700 ml  Net 1525.73 ml   Filed Weights   10/31/21 0400 10/31/21 1500 11/02/21 0900  Weight: 79.8 kg 79.8 kg 87.1 kg    Physical Exam  Gen:-Awake, appears comfortable  Neck-Left neck central line, right neck temporary HD catheter Lungs-bases with bibasilar rhonchi  CV- S1, S2 normal, regular  Abd-  +ve B.Sounds, Abd Soft, No tenderness,     Extremity/Skin:- No  edema, pedal pulses present  NeuroPsych-affect is flat, follows commands  GU-Foley in situ Breast- Healing left breast periareolar wound Rectum--Rectal tube in situ  data Reviewed: I have personally reviewed following labs and imaging studies  CBC: Recent Labs  Lab 10/29/21 1700 10/29/21 2236 10/30/21 0423 11/01/21 0432 11/02/21 0125  WBC 17.8* 23.9* 24.1* 19.2* 18.8*  NEUTROABS 15.6*  --   --   --   --   HGB 7.1* 7.6* 7.4* 7.1* 8.2*  HCT 21.0*  22.7* 21.5* 21.0* 24.9*  MCV 78.4* 80.8 78.5* 79.2* 81.6  PLT 239 246 239 162 425*   Basic Metabolic Panel: Recent Labs  Lab 10/29/21 2119 10/30/21 0423 10/30/21 2122 10/31/21 0528 10/31/21 0738 10/31/21 1328 11/01/21 0432 11/02/21 0125  NA 126*   < > 130* 132*  --  129* 135 134*  K <2.0*   < > 2.7* 2.8*  --  3.0* 3.9 3.0*  CL 99   < > 100 103  --  104 103 103  CO2 11*   < > 16* 17*  --  17* 22 21*  GLUCOSE 181*   < > 159* 157*  --  131* 149* 138*  BUN 188*   < > 85* 80*  --  73* 34* 32*  CREATININE 10.60*   < > 5.60* 5.31*  --  5.52* 2.98* 3.22*  CALCIUM 7.4*   < > 7.1* 7.7*  --  7.5* 7.6* 7.8*  MG 2.3  --   --   --  1.7  --   --   --   PHOS  --    < > 1.3* 1.2*  --  1.4* 1.5* 3.7   < > = values in this interval not displayed.   GFR: Estimated Creatinine Clearance: 21.6 mL/min (A) (by C-G formula based on SCr of 3.22 mg/dL (H)). Liver Function Tests: Recent Labs  Lab 10/29/21 1700 10/30/21 0423 10/30/21 1340 10/30/21 2122 10/31/21 0528 10/31/21 1328 11/01/21 0432  AST 24 19  --   --   --   --   --   ALT 14 10  --   --   --   --   --   ALKPHOS 104 86  --   --   --   --   --   BILITOT 0.9 0.8  --   --   --   --   --   PROT 6.4* 5.2*  --   --   --   --   --   ALBUMIN 2.3* 1.9* 1.8* 1.8* 1.7* 1.7* 1.7*   No results for input(s): LIPASE, AMYLASE in the last 168 hours. No results for input(s): AMMONIA in the last 168 hours. Coagulation Profile: Recent Labs  Lab 10/29/21 1751  INR 1.4*    Cardiac Enzymes: No results for input(s): CKTOTAL, CKMB, CKMBINDEX, TROPONINI in the last 168 hours. BNP (last 3 results) No results for input(s): PROBNP in the last 8760 hours. HbA1C: No results for input(s): HGBA1C in the last 72 hours.  CBG: Recent Labs  Lab 11/01/21 1551 11/01/21 2004 11/01/21 2022 11/01/21 2135 11/02/21 0753  GLUCAP 124* 55* 71 123* 141*   Lipid Profile: No results for input(s): CHOL, HDL, LDLCALC, TRIG, CHOLHDL, LDLDIRECT in the last 72 hours. Thyroid Function Tests: No results for input(s): TSH, T4TOTAL, FREET4, T3FREE, THYROIDAB in the last 72 hours. Anemia Panel: Recent Labs    10/31/21 0528  VITAMINB12 829  FOLATE 15.8   Urine analysis:    Component Value Date/Time   COLORURINE GREEN (A) 10/29/2021 2005   APPEARANCEUR CLOUDY (A) 10/29/2021 2005   LABSPEC 1.020 10/29/2021 2005   PHURINE 6.0 10/29/2021 2005   GLUCOSEU NEGATIVE 10/29/2021 2005   HGBUR SMALL (A) 10/29/2021 2005   BILIRUBINUR NEGATIVE 10/29/2021 2005   KETONESUR 20 (A) 10/29/2021 2005   PROTEINUR 100 (A) 10/29/2021 2005   NITRITE POSITIVE (A) 10/29/2021 2005   LEUKOCYTESUR LARGE (A) 10/29/2021 2005   Sepsis Labs: @LABRCNTIP (procalcitonin:4,lacticidven:4)  ) Recent Results (  from the past 240 hour(s))  Resp Panel by RT-PCR (Flu A&B, Covid) Nasopharyngeal Swab     Status: None   Collection Time: 10/29/21  5:27 PM   Specimen: Nasopharyngeal Swab; Nasopharyngeal(NP) swabs in vial transport medium  Result Value Ref Range Status   SARS Coronavirus 2 by RT PCR NEGATIVE NEGATIVE Final    Comment: (NOTE) SARS-CoV-2 target nucleic acids are NOT DETECTED.  The SARS-CoV-2 RNA is generally detectable in upper respiratory specimens during the acute phase of infection. The lowest concentration of SARS-CoV-2 viral copies this assay can detect is 138 copies/mL. A negative result does not preclude SARS-Cov-2 infection and should not be used as the sole basis for treatment or other  patient management decisions. A negative result may occur with  improper specimen collection/handling, submission of specimen other than nasopharyngeal swab, presence of viral mutation(s) within the areas targeted by this assay, and inadequate number of viral copies(<138 copies/mL). A negative result must be combined with clinical observations, patient history, and epidemiological information. The expected result is Negative.  Fact Sheet for Patients:  EntrepreneurPulse.com.au  Fact Sheet for Healthcare Providers:  IncredibleEmployment.be  This test is no t yet approved or cleared by the [place] FDA and  has been authorized for detection and/or diagnosis of SARS-CoV-2 by FDA under an Emergency Use Authorization (EUA). This EUA will remain  in effect (meaning this test can be used) for the duration of the COVID-19 declaration under Section 564(b)(1) of the Act, 21 U.S.C.section 360bbb-3(b)(1), unless the authorization is terminated  or revoked sooner.       Influenza A by PCR NEGATIVE NEGATIVE Final   Influenza B by PCR NEGATIVE NEGATIVE Final    Comment: (NOTE) The Xpert Xpress SARS-CoV-2/FLU/RSV plus assay is intended as an aid in the diagnosis of influenza from Nasopharyngeal swab specimens and should not be used as a sole basis for treatment. Nasal washings and aspirates are unacceptable for Xpert Xpress SARS-CoV-2/FLU/RSV testing.  Fact Sheet for Patients: EntrepreneurPulse.com.au  Fact Sheet for Healthcare Providers: IncredibleEmployment.be  This test is not yet approved or cleared by the [place] FDA and has been authorized for detection and/or diagnosis of SARS-CoV-2 by FDA under an Emergency Use Authorization (EUA). This EUA will remain in effect (meaning this test can be used) for the duration of the COVID-19 declaration under Section 564(b)(1) of the Act, 21 U.S.C. section  360bbb-3(b)(1), unless the authorization is terminated or revoked.  Performed at [facility], [address], [place], [place] [address]   Culture, blood (routine x 2)     Status: None (Preliminary result)   Collection Time: 10/29/21  5:51 PM   Specimen: BLOOD  Result Value Ref Range Status   Specimen Description BLOOD BLOOD RIGHT ARM  Final   Special Requests   Final    BOTTLES DRAWN AEROBIC AND ANAEROBIC Blood Culture adequate volume   Culture   Final    NO GROWTH 4 DAYS Performed at [facility], [address] [place], [place] [address]    Report Status PENDING  Incomplete  Culture, blood (routine x 2)     Status: None (Preliminary result)   Collection Time: 10/29/21  9:19 PM   Specimen: BLOOD  Result Value Ref Range Status   Specimen Description BLOOD central line  Final   Special Requests   Final    BOTTLES DRAWN AEROBIC AND ANAEROBIC Blood Culture adequate volume   Culture   Final    NO GROWTH 4 DAYS Performed at [facility]  [facility], [address] [place], [place] [address]    Report Status PENDING  Incomplete  MRSA Next Gen by PCR, Nasal     Status: Abnormal   Collection Time: 10/30/21  1:52 AM   Specimen: Nasal Mucosa; Nasal Swab  Result Value Ref Range Status   MRSA by PCR Next Gen DETECTED (A) NOT DETECTED Final    Comment: RESULT CALLED TO, READ BACK BY AND VERIFIED WITH: ASHLEY @ 1055 ON [id number] BY HENDERSON L (NOTE) The GeneXpert MRSA Assay (FDA approved for NASAL specimens only), is one component of a comprehensive MRSA colonization surveillance program. It is not intended to diagnose MRSA infection nor to guide or monitor treatment for MRSA infections. Test performance is not FDA approved in patients less than 51 years old. Performed at [facility], [address], [place], [place] [address]   Gastrointestinal Panel by PCR , Stool     Status: None   Collection Time: 10/30/21  5:35 PM   Specimen: Rectum; Stool  Result Value Ref Range Status   Campylobacter  species NOT DETECTED NOT DETECTED Final   Plesimonas shigelloides NOT DETECTED NOT DETECTED Final   Salmonella species NOT DETECTED NOT DETECTED Final   Yersinia enterocolitica NOT DETECTED NOT DETECTED Final   Vibrio species NOT DETECTED NOT DETECTED Final   Vibrio cholerae NOT DETECTED NOT DETECTED Final   Enteroaggregative E coli (EAEC) NOT DETECTED NOT DETECTED Final   Enteropathogenic E coli (EPEC) NOT DETECTED NOT DETECTED Final   Enterotoxigenic E coli (ETEC) NOT DETECTED NOT DETECTED Final   Shiga like toxin producing E coli (STEC) NOT DETECTED NOT DETECTED Final   Shigella/Enteroinvasive E coli (EIEC) NOT DETECTED NOT DETECTED Final   Cryptosporidium NOT DETECTED NOT DETECTED Final   Cyclospora cayetanensis NOT DETECTED NOT DETECTED Final   Entamoeba histolytica NOT DETECTED NOT DETECTED Final   Giardia lamblia NOT DETECTED NOT DETECTED Final   Adenovirus F40/41 NOT DETECTED NOT DETECTED Final   Astrovirus NOT DETECTED NOT DETECTED Final   Norovirus GI/GII NOT DETECTED NOT DETECTED Final   Rotavirus A NOT DETECTED NOT DETECTED Final   Sapovirus (I, II, IV, and V) NOT DETECTED NOT DETECTED Final    Comment: Performed at [facility], [address] [place], [place] [address]  C Difficile Quick Screen w PCR reflex     Status: None   Collection Time: 10/30/21  5:35 PM   Specimen: Rectum; Stool  Result Value Ref Range Status   C Diff antigen NEGATIVE NEGATIVE Final   C Diff toxin NEGATIVE NEGATIVE Final   C Diff interpretation No C. difficile detected.  Final    Comment: Performed at [facility], [address] [place], [place] [address]    Radiology Studies: No results found.   Scheduled Meds:  sodium chloride   Intravenous Once   Chlorhexidine Gluconate Cloth  6 each Topical Daily   Chlorhexidine Gluconate Cloth  6 each Topical Q0600   Chlorhexidine Gluconate Cloth  6 each Topical Q0600   darbepoetin (ARANESP) injection - DIALYSIS  40 mcg Subcutaneous Q  Mon-HD   enoxaparin (LOVENOX) injection  30 mg Subcutaneous Q24H   insulin aspart  0-5 Units Subcutaneous QHS   insulin aspart  0-9 Units Subcutaneous TID WC   mupirocin ointment   Nasal BID   potassium chloride  40 mEq Oral Q3H   vancomycin variable dose per unstable renal function (pharmacist dosing)   Does not apply See admin instructions   Continuous Infusions:  sodium chloride Stopped (  10/29/21 2251)   sodium chloride     sodium chloride     ceFEPime (MAXIPIME) IV 1 g (11/01/21 2049)   lactated ringers 75 mL/hr at 11/02/21 0821   norepinephrine (LEVOPHED) Adult infusion 8 mcg/min (11/02/21 0850)    LOS: 3 days   Roxan Hockey M.D on 11/02/2021 at 10:16 AM  Go to [email or web address] - for contact info  [facility] - Office  [phone]  If 7PM-7AM, please contact night-coverage [email or web address] Password [email or web address] 11/02/2021, 10:16 AM

## 2021-11-02 NOTE — Progress Notes (Signed)
Admit: 10/29/2021 LOS: 3  48F AoCKD (BL SCr ~2), profoundly malnourished, shock  Subjective:  Not eating very much, req prompting Awake but minimal interaction UOP 2.6L SCR stable 3.2, K 3.0 P repleted yesterday SAlb < 2 Remains on NE gtt, RA, AF BCx NGTD x2 \ K repleted this AM   12/18 0701 - 12/19 0700 In: 4225.7 [P.O.:150; I.V.:3126.3; Blood:410; IV Piggyback:539.4] Out: 2600 [Urine:2600]  Filed Weights   10/30/21 1635 10/31/21 0400 10/31/21 1500  Weight: 75.4 kg 79.8 kg 79.8 kg    Scheduled Meds:  sodium chloride   Intravenous Once   Chlorhexidine Gluconate Cloth  6 each Topical Daily   Chlorhexidine Gluconate Cloth  6 each Topical Q0600   Chlorhexidine Gluconate Cloth  6 each Topical Q0600   darbepoetin (ARANESP) injection - DIALYSIS  40 mcg Subcutaneous Q Mon-HD   enoxaparin (LOVENOX) injection  30 mg Subcutaneous Q24H   insulin aspart  0-5 Units Subcutaneous QHS   insulin aspart  0-9 Units Subcutaneous TID WC   mupirocin ointment   Nasal BID   potassium chloride  40 mEq Oral Q3H   vancomycin variable dose per unstable renal function (pharmacist dosing)   Does not apply See admin instructions   Continuous Infusions:  sodium chloride Stopped (10/29/21 2251)   sodium chloride     sodium chloride     ceFEPime (MAXIPIME) IV 1 g (11/01/21 2049)   lactated ringers 75 mL/hr at 11/02/21 0821   norepinephrine (LEVOPHED) Adult infusion 10 mcg/min (11/02/21 0716)   PRN Meds:.sodium chloride, sodium chloride, alteplase, heparin, pentafluoroprop-tetrafluoroeth, prochlorperazine  Current Labs: reviewed   Latest Reference Range & Units 10/30/21 04:23  Saturation Ratios 10.4 - 31.8 % 71 (H)  Ferritin 11 - 307 ng/mL 1,080 (H)  (H): Data is abnormally high  Physical Exam:  Blood pressure 116/61, pulse 90, temperature 99.7 F (37.6 C), resp. rate (!) 7, height 5\' 4"  (1.626 m), weight 79.8 kg, last menstrual period 09/26/2015, SpO2 100 %. Appears older than biological age,  NAD Temporal wasting Tachycardic, regular, no rub CTAB 1+ UE edema, b/l, symmetrical; trace in LE Nonfocal S/nt/nd R IJ HD cath, temp  A AoCKD3 (BL SCr ~2) in context of #3 and #4; Sig recovery of GFR with hydration and supportive care but SCr now stalled above historical baseline. Has R IJ HD cath, temp.  Nonoliguric Solitary L kidney, hx/o R nephrectomy; no [place] since 08/2021 Shock on NE, Cx Pending/NGTD; Vanc/Cefepime Malnourished with hypokalemia, hypophosphatemia, PCM Anemia, Hb 8s, ESA ordered today; Fe replete  P Does not need HD, would leave in HD cath for another 24h  Check P Cont supportive care Medication Issues; Preferred narcotic agents for pain control are hydromorphone, fentanyl, and methadone. Morphine should not be used.  Baclofen should be avoided Avoid oral sodium phosphate and magnesium citrate based laxatives / bowel preps    [name] MD 11/02/2021, 8:39 AM  Recent Labs  Lab 10/31/21 0528 10/31/21 1328 11/01/21 0432 11/02/21 0125  NA 132* 129* 135 134*  K 2.8* 3.0* 3.9 3.0*  CL 103 104 103 103  CO2 17* 17* 22 21*  GLUCOSE 157* 131* 149* 138*  BUN 80* 73* 34* 32*  CREATININE 5.31* 5.52* 2.98* 3.22*  CALCIUM 7.7* 7.5* 7.6* 7.8*  PHOS 1.2* 1.4* 1.5*  --    Recent Labs  Lab 10/29/21 1700 10/29/21 2236 10/30/21 0423 11/01/21 0432 11/02/21 0125  WBC 17.8*   < > 24.1* 19.2* 18.8*  NEUTROABS 15.6*  --   --   --   --  HGB 7.1*   < > 7.4* 7.1* 8.2*  HCT 21.0*   < > 21.5* 21.0* 24.9*  MCV 78.4*   < > 78.5* 79.2* 81.6  PLT 239   < > 239 162 127*   < > = values in this interval not displayed.

## 2021-11-02 NOTE — Plan of Care (Signed)
Turned Q2. Discussed Medications and plan of care with patient.

## 2021-11-03 ENCOUNTER — Inpatient Hospital Stay (HOSPITAL_COMMUNITY): Payer: Medicare Other

## 2021-11-03 LAB — CBC
HCT: 23.7 % — ABNORMAL LOW (ref 36.0–46.0)
Hemoglobin: 7.9 g/dL — ABNORMAL LOW (ref 12.0–15.0)
MCH: 27.3 pg (ref 26.0–34.0)
MCHC: 33.3 g/dL (ref 30.0–36.0)
MCV: 82 fL (ref 80.0–100.0)
Platelets: 99 10*3/uL — ABNORMAL LOW (ref 150–400)
RBC: 2.89 MIL/uL — ABNORMAL LOW (ref 3.87–5.11)
RDW: 17.8 % — ABNORMAL HIGH (ref 11.5–15.5)
WBC: 15.4 10*3/uL — ABNORMAL HIGH (ref 4.0–10.5)
nRBC: 0 % (ref 0.0–0.2)

## 2021-11-03 LAB — RENAL FUNCTION PANEL
Albumin: 1.6 g/dL — ABNORMAL LOW (ref 3.5–5.0)
Anion gap: 9 (ref 5–15)
BUN: 32 mg/dL — ABNORMAL HIGH (ref 6–20)
CO2: 20 mmol/L — ABNORMAL LOW (ref 22–32)
Calcium: 8.1 mg/dL — ABNORMAL LOW (ref 8.9–10.3)
Chloride: 105 mmol/L (ref 98–111)
Creatinine, Ser: 3.51 mg/dL — ABNORMAL HIGH (ref 0.44–1.00)
GFR, Estimated: 15 mL/min — ABNORMAL LOW (ref >60)
Glucose, Bld: 139 mg/dL — ABNORMAL HIGH (ref 70–99)
Phosphorus: 3.2 mg/dL (ref 2.5–4.6)
Potassium: 3.3 mmol/L — ABNORMAL LOW (ref 3.5–5.1)
Sodium: 134 mmol/L — ABNORMAL LOW (ref 135–145)

## 2021-11-03 LAB — GLUCOSE, CAPILLARY
Glucose-Capillary: 128 mg/dL — ABNORMAL HIGH (ref 70–99)
Glucose-Capillary: 198 mg/dL — ABNORMAL HIGH (ref 70–99)
Glucose-Capillary: 101 mg/dL — ABNORMAL HIGH (ref 70–99)
Glucose-Capillary: 106 mg/dL — ABNORMAL HIGH (ref 70–99)

## 2021-11-03 LAB — MISC LABCORP TEST (SEND OUT): Labcorp test code: 354928

## 2021-11-03 MED ORDER — ALBUMIN HUMAN 25 % IV SOLN
12.5000 g | Freq: Once | INTRAVENOUS | Status: AC
  Administered 2021-11-03: 18:00:00 12.5 g via INTRAVENOUS
  Filled 2021-11-03: qty 50

## 2021-11-03 MED ORDER — OXYCODONE HCL 5 MG PO TABS
5.0000 mg | ORAL_TABLET | Freq: Three times a day (TID) | ORAL | Status: DC | PRN
  Administered 2021-11-03 – 2021-11-05 (×2): 5 mg via ORAL
  Filled 2021-11-03 (×3): qty 1

## 2021-11-03 MED ORDER — ALPRAZOLAM 0.5 MG PO TABS
0.5000 mg | ORAL_TABLET | Freq: Once | ORAL | Status: AC
Start: 2021-11-03 — End: 2021-11-03
  Administered 2021-11-03: 10:00:00 0.5 mg via ORAL
  Filled 2021-11-03: qty 1

## 2021-11-03 MED ORDER — ALPRAZOLAM 0.5 MG PO TABS
1.0000 mg | ORAL_TABLET | Freq: Once | ORAL | Status: AC
  Administered 2021-11-03: 18:00:00 1 mg via ORAL
  Filled 2021-11-03: qty 2

## 2021-11-03 MED ORDER — DIPHENOXYLATE-ATROPINE 2.5-0.025 MG PO TABS
2.0000 | ORAL_TABLET | Freq: Once | ORAL | Status: AC
  Administered 2021-11-03: 09:00:00 2 via ORAL
  Filled 2021-11-03: qty 2

## 2021-11-03 MED ORDER — MIDODRINE HCL 5 MG PO TABS
10.0000 mg | ORAL_TABLET | Freq: Three times a day (TID) | ORAL | Status: DC
  Administered 2021-11-03 – 2021-11-07 (×11): 10 mg via ORAL
  Filled 2021-11-03 (×13): qty 2

## 2021-11-03 MED ORDER — POTASSIUM CHLORIDE 20 MEQ PO PACK
40.0000 meq | PACK | ORAL | Status: AC
  Administered 2021-11-03 (×3): 40 meq via ORAL
  Filled 2021-11-03 (×3): qty 2

## 2021-11-03 MED ORDER — ALBUMIN HUMAN 25 % IV SOLN
12.5000 g | Freq: Once | INTRAVENOUS | Status: AC
  Administered 2021-11-03: 20:00:00 12.5 g via INTRAVENOUS
  Filled 2021-11-03: qty 50

## 2021-11-03 NOTE — Progress Notes (Signed)
Admit: 10/29/2021 LOS: 4  85F AoCKD (BL SCr ~2), profoundly malnourished, shock  Subjective:  UOP just under 1L SCr slighlty up, K is 3.3 Still on NE gtt @ 7 Now linezolid / cefepime BCx NGTD x2 P > 3 AAO to self and location, not date/year Still has R Temp HD cath IJ Eating some, req prompting  12/19 0701 - 12/20 0700 In: 2601.3 [I.V.:2201.2; IV Piggyback:400.2] Out: 8325 [Urine:930; Stool:825]  Filed Weights   10/31/21 1500 11/02/21 0900 11/03/21 0443  Weight: 79.8 kg 87.1 kg 85.4 kg    Scheduled Meds:  sodium chloride   Intravenous Once   Chlorhexidine Gluconate Cloth  6 each Topical Daily   Chlorhexidine Gluconate Cloth  6 each Topical Q0600   Chlorhexidine Gluconate Cloth  6 each Topical Q0600   darbepoetin (ARANESP) injection - DIALYSIS  40 mcg Subcutaneous Q Mon-HD   enoxaparin (LOVENOX) injection  30 mg Subcutaneous Q24H   feeding supplement (NEPRO CARB STEADY)  237 mL Oral TID PC   insulin aspart  0-5 Units Subcutaneous QHS   insulin aspart  0-9 Units Subcutaneous TID WC   mupirocin ointment   Nasal BID   potassium chloride  40 mEq Oral Q3H   Continuous Infusions:  sodium chloride Stopped (10/29/21 2251)   sodium chloride     sodium chloride     ceFEPime (MAXIPIME) IV Stopped (11/02/21 2128)   lactated ringers 75 mL/hr at 11/03/21 0445   linezolid (ZYVOX) IV Stopped (11/02/21 1847)   norepinephrine (LEVOPHED) Adult infusion 7 mcg/min (11/03/21 0445)   PRN Meds:.sodium chloride, sodium chloride, alteplase, heparin, loperamide, pentafluoroprop-tetrafluoroeth, prochlorperazine  Current Labs: reviewed   Latest Reference Range & Units 10/30/21 04:23  Saturation Ratios 10.4 - 31.8 % 71 (H)  Ferritin 11 - 307 ng/mL 1,080 (H)  (H): Data is abnormally high  Physical Exam:  Blood pressure 110/68, pulse (!) 120, temperature 98.6 F (37 C), resp. rate 18, height 5\' 4"  (1.626 m), weight 85.4 kg, last menstrual period 09/26/2015, SpO2 100 %. Appears older than  biological age, NAD Temporal wasting Tachycardic, regular, no rub CTAB 1+ UE edema, b/l, symmetrical; trace in LE Nonfocal S/nt/nd R IJ HD cath, temp; L IJ CVC AAO to name, location; not date/year  A AoCKD3 (BL SCr ~2) in context of #3 and #4; Req HD 12/16 and 12/17.  Seems to have recovery of GFR with hydration and supportive care but SCr now stalled above historical baseline. Has R IJ HD cath, temp.  Nonoliguric.  NO anticipated HD with good UOP and stable electrolytes. Remove Temp HD cath today, and If I'm incorrect will pursue TDC.   Solitary L kidney, hx/o R nephrectomy; no [place] since 08/2021 Shock on NE, Cx Pending/NGTD; Vanc/Cefepime Malnourished with hypokalemia, hypophosphatemia, PCM Anemia, Hb 8s, ESA ordered today; Fe replete  P Remove Temp HD cath Cont supportive care, IVFs (LR good choice) while diarrhea persists Consider trial of midodrine Will follow along closely Medication Issues; Preferred narcotic agents for pain control are hydromorphone, fentanyl, and methadone. Morphine should not be used.  Baclofen should be avoided Avoid oral sodium phosphate and magnesium citrate based laxatives / bowel preps    [name] MD 11/03/2021, 9:06 AM  Recent Labs  Lab 11/01/21 0432 11/02/21 0125 11/03/21 0430  NA 135 134* 134*  K 3.9 3.0* 3.3*  CL 103 103 105  CO2 22 21* 20*  GLUCOSE 149* 138* 139*  BUN 34* 32* 32*  CREATININE 2.98* 3.22* 3.51*  CALCIUM 7.6* 7.8* 8.1*  PHOS 1.5* 3.7 3.2    Recent Labs  Lab 10/29/21 1700 10/29/21 2236 11/01/21 0432 11/02/21 0125 11/03/21 0430  WBC 17.8*   < > 19.2* 18.8* 15.4*  NEUTROABS 15.6*  --   --   --   --   HGB 7.1*   < > 7.1* 8.2* 7.9*  HCT 21.0*   < > 21.0* 24.9* 23.7*  MCV 78.4*   < > 79.2* 81.6 82.0  PLT 239   < > 162 127* 99*   < > = values in this interval not displayed.

## 2021-11-03 NOTE — Progress Notes (Signed)
PROGRESS NOTE     Courtney Dyer, is a 55 y.o. female, DOB - January 02, 1966, TIW:[id number]  Admit date - 10/29/2021   Admitting Physician Bernadette Hoit, DO  Outpatient Primary MD for the patient is Pcp, No  LOS - 4  No chief complaint on file.     Altered mentation  Brief Narrative:  55 y.o. female with medical history significant for hyperlipidemia, T2DM, gout, GERD , HTN, CKD IV admitted on 10/30/2021 with AKI on CKD stage IV, severe electrolyte derangement, and persistent  severe dehydration partly due to persistent nausea and vomiting with persistent hypotension requiring IV fluids on IV Levophed for pressure support in the setting of sepsis and dehydration  Assessment & Plan:   Principal Problem:   Sepsis secondary to UTI [facility]) Active Problems:   Acute kidney injury ([place])   Hypokalemia   Symptomatic anemia   Hyponatremia   High anion gap metabolic acidosis   Dehydration   Leukocytosis   Microcytic anemia   Hypoalbuminemia due to protein-calorie malnutrition (HCC)   Acute metabolic encephalopathy   Mixed hyperlipidemia   Type 2 diabetes mellitus (HCC)   Gout   Prolonged QT interval   Obesity, Class III, BMI 40-49.9 (morbid obesity) ([place])   1)Persistent Hypotension/hemodynamic instability--- in the setting of dehydration and presumed sepsis -continues to require IV Levophed for pressure support (levophed at 6 mg/hr) -Continues to require IV fluids -continue IV cefepime pending further culture data--- there is concern for both UTI and pneumonia -Stopped vancomycin due to kidney concerns, okay to use linezolid -Added midodrine with hope to wean off Levophed -Blood, urine and GI cultures remain negative to date -Check a.m. cortisol -Give IV albumin as patient appears to be 3rd spacing--appears intravascularly volume depleted  2)AKi on CKD IV with anion gap metabolic acidosis--- suspect ATN --patient with significant dehydration due to persistent nausea and  vomiting, also suspect kidney injury secondary to persistent hypotension -Solitary L kidney, hx/o R nephrectomy;  - CT a/p with dysplastic right kidney and no stones or hydronephrosis on the left.   creatinine was around 10, BUN around 160 - Scr was 2.98 after serial back to back Hemodialysis 10/30/21 and 10/31/21 after hemodialysis -Creatinine trending back up, urine output trending down, -IV albumin as above #1 consider IV fluids -Nephrology input appreciated, right neck temporary HD catheter removed on 11/03/2021 per nephrology  3) acute on chronic anemia--- no evidence of ongoing bleeding suspect some component of chronic anemia of CKD -Serum iron is 81 iron saturation 71, ferritin is not low,  -B12 and folate are not low -Hgb > 7 -Patient has received a total of 2 units of PRBC this admission  4)Chronic breast wound--- does not appear infected, Healing left breast periareolar wound--please see ultrasound report from 10/27/2021  5)Hypokalemia/hyponatremia/hypophosphatemia--electrolyte derangement due to persistent diarrhea and poor oral intake -magnesium WNL -Replace phosphorus and potassium -Electrolytes improved after hemodialysis session and with replacement  6)DM2- A1C 5.2 reflecting excellent diabetic control PTA -Use Novolog/Humalog Sliding scale insulin with Accu-Cheks/Fingersticks as ordered   7)Social/Ethics--patient's aunt [name] is primary contact and decision maker [phone]) -Patient remains a full code  8)Sepsis with Septic Shock--- suspect related to UTI and pneumonia -PCT 0.66 -WBC 24.1>>19.2>>18.8>>15.4 -- Culture data and antibiotics as above #1  9) Acute metabolic encephalopathy secondary to sepsis with septic shock and persistent hypotension,, dehydration, worsening renal function and electrolyte and metabolic derangement as above -Improved with hemodialysis and treatment as above  10)Diarrhea-- C diff and GI pathogen/culture is  Negative -Rectal tube placed continue IV fluids, replace electrolytes, -Okay to use Imodium and Lomotil as needed   CRITICAL CARE Performed by: Roxan Hockey  Total critical care time: 44 minutes  Critical care time was exclusive of separately billable procedures and treating other patients.  -Persistent hypotension requiring IV Levophed at 6 mg/hr for pressure support -Severe electrolyte derangements requiring emergent hemodialysis and correction  Critical care was necessary to treat or prevent imminent or life-threatening deterioration.  Critical care was time spent personally by me on the following activities: development of treatment plan with patient and/or surrogate as well as nursing, discussions with consultants, evaluation of patient's response to treatment, examination of patient, obtaining history from patient or surrogate, ordering and performing treatments and interventions, ordering and review of laboratory studies, ordering and review of radiographic studies, pulse oximetry and re-evaluation of patient's condition.  Disposition/Need for in-Hospital Stay- patient unable to be discharged at this time due to ---Persistent hypotension requiring IV Levophed for pressure support -Severe electrolyte derangements requiring emergent hemodialysis -Patient will return to [facility] SNF once medically improved  Status is: Inpatient  Remains inpatient appropriate because: As above  Disposition: The patient is from: SNF              Anticipated d/c is to: SNF              Anticipated d/c date is: > 3 days              Patient currently is not medically stable to d/c. Barriers: Not Clinically Stable-   Code Status :  -  Code Status: Full Code   Family Communication:   patient's aunt [name] is primary contact and decision maker [phone]  Consults  : Nephrology/general surgery  DVT Prophylaxis  :   - SCDs  enoxaparin (LOVENOX) injection 30 mg Start: 10/30/21  1000 SCDs Start: 10/30/21 0152  Lab Results  Component Value Date   PLT 99 (L) 11/03/2021    Inpatient Medications  Scheduled Meds:  sodium chloride   Intravenous Once   Chlorhexidine Gluconate Cloth  6 each Topical Daily   Chlorhexidine Gluconate Cloth  6 each Topical Q0600   Chlorhexidine Gluconate Cloth  6 each Topical Q0600   darbepoetin (ARANESP) injection - DIALYSIS  40 mcg Subcutaneous Q Mon-HD   enoxaparin (LOVENOX) injection  30 mg Subcutaneous Q24H   feeding supplement (NEPRO CARB STEADY)  237 mL Oral TID PC   insulin aspart  0-5 Units Subcutaneous QHS   insulin aspart  0-9 Units Subcutaneous TID WC   midodrine  10 mg Oral TID WC   mupirocin ointment   Nasal BID   Continuous Infusions:  sodium chloride Stopped (10/29/21 2251)   sodium chloride     sodium chloride     albumin human     ceFEPime (MAXIPIME) IV Stopped (11/02/21 2128)   lactated ringers 75 mL/hr at 11/03/21 1856   linezolid (ZYVOX) IV Stopped (11/03/21 1051)   norepinephrine (LEVOPHED) Adult infusion 3 mcg/min (11/03/21 1856)   PRN Meds:.sodium chloride, sodium chloride, alteplase, heparin, loperamide, oxyCODONE, pentafluoroprop-tetrafluoroeth, prochlorperazine   Anti-infectives (From admission, onward)    Start     Dose/Rate Route Frequency Ordered Stop   11/02/21 2000  ceFEPIme (MAXIPIME) 2 g in sodium chloride 0.9 % 100 mL IVPB        2 g 200 mL/hr over 30 Minutes Intravenous Every 24 hours 11/02/21 1316     11/02/21 1800  linezolid (ZYVOX) IVPB 600 mg  600 mg 300 mL/hr over 60 Minutes Intravenous Every 12 hours 11/02/21 1247     10/31/21 1400  vancomycin (VANCOCIN) IVPB 750 mg/150 ml premix        750 mg 150 mL/hr over 60 Minutes Intravenous Every Dialysis 10/31/21 0835 10/31/21 1840   10/30/21 2000  vancomycin (VANCOREADY) IVPB 1500 mg/300 mL  Status:  Discontinued        1,500 mg 150 mL/hr over 120 Minutes Intravenous Every 24 hours 10/29/21 1806 10/29/21 2007   10/30/21 2000   ceFEPIme (MAXIPIME) 1 g in sodium chloride 0.9 % 100 mL IVPB  Status:  Discontinued        1 g 200 mL/hr over 30 Minutes Intravenous Every 24 hours 10/29/21 2009 11/02/21 1316   10/30/21 1600  vancomycin (VANCOREADY) IVPB 750 mg/150 mL        750 mg 150 mL/hr over 60 Minutes Intravenous Every M-W-F (Hemodialysis) 10/30/21 1146 10/30/21 2006   10/30/21 1136  vancomycin variable dose per unstable renal function (pharmacist dosing)  Status:  Discontinued         Does not apply See admin instructions 10/30/21 1137 11/02/21 1247   10/30/21 0400  ceFEPIme (MAXIPIME) 2 g in sodium chloride 0.9 % 100 mL IVPB  Status:  Discontinued        2 g 200 mL/hr over 30 Minutes Intravenous Every 8 hours 10/29/21 1806 10/29/21 2007   10/29/21 2015  ceFEPIme (MAXIPIME) 1 g in sodium chloride 0.9 % 100 mL IVPB  Status:  Discontinued        1 g 200 mL/hr over 30 Minutes Intravenous Every 24 hours 10/29/21 2007 10/29/21 2009   10/29/21 1800  ceFEPIme (MAXIPIME) 2 g in sodium chloride 0.9 % 100 mL IVPB        2 g 200 mL/hr over 30 Minutes Intravenous  Once 10/29/21 1747 10/29/21 1852   10/29/21 1800  metroNIDAZOLE (FLAGYL) IVPB 500 mg        500 mg 100 mL/hr over 60 Minutes Intravenous  Once 10/29/21 1747 10/29/21 2004   10/29/21 1800  vancomycin (VANCOCIN) IVPB 1000 mg/200 mL premix        1,000 mg 200 mL/hr over 60 Minutes Intravenous  Once 10/29/21 1747 10/29/21 2149   10/29/21 1800  vancomycin (VANCOCIN) IVPB 1000 mg/200 mL premix        1,000 mg 200 mL/hr over 60 Minutes Intravenous  Once 10/29/21 1754 10/29/21 2100       Subjective: Courtney Dyer today has no fevers, no emesis,  No chest pain,   - Continues to have diarrhea -Oral intake improving -Urine output not as great  Objective: Vitals:   11/03/21 1800 11/03/21 1815 11/03/21 1830 11/03/21 1845  BP: 104/69 103/70 (!) 129/97 103/62  Pulse:      Resp: 16 17 13 14   Temp: 97.9 F (36.6 C) 97.7 F (36.5 C) (!) 97.5 F (36.4 C) (!) 97.5 F  (36.4 C)  TempSrc:      SpO2:      Weight:      Height:        Intake/Output Summary (Last 24 hours) at 11/03/2021 1901 Last data filed at 11/03/2021 1856 Gross per 24 hour  Intake 4062.01 ml  Output 1490 ml  Net 2572.01 ml   Filed Weights   10/31/21 1500 11/02/21 0900 11/03/21 0443  Weight: 79.8 kg 87.1 kg 85.4 kg    Physical Exam  Gen:-Awake, appears comfortable  Neck-Left neck central line,  -right neck  temporary HD catheter-removed 11/03/2021 Lungs-improved air improvement, no wheezing  CV- S1, S2 normal, regular  Abd-  +ve B.Sounds, Abd Soft, No tenderness,    Extremity/Skin:- No  edema, pedal pulses present  NeuroPsych-patient with baseline anxiety, cognitive and memory deficits GU-Foley in situ Breast- Healing left breast periareolar wound Rectum--Rectal tube in situ  data Reviewed: I have personally reviewed following labs and imaging studies  CBC: Recent Labs  Lab 10/29/21 1700 10/29/21 2236 10/30/21 0423 11/01/21 0432 11/02/21 0125 11/03/21 0430  WBC 17.8* 23.9* 24.1* 19.2* 18.8* 15.4*  NEUTROABS 15.6*  --   --   --   --   --   HGB 7.1* 7.6* 7.4* 7.1* 8.2* 7.9*  HCT 21.0* 22.7* 21.5* 21.0* 24.9* 23.7*  MCV 78.4* 80.8 78.5* 79.2* 81.6 82.0  PLT 239 246 239 162 127* 99*   Basic Metabolic Panel: Recent Labs  Lab 10/29/21 2119 10/30/21 0423 10/31/21 0528 10/31/21 0738 10/31/21 1328 11/01/21 0432 11/02/21 0125 11/03/21 0430  NA 126*   < > 132*  --  129* 135 134* 134*  K <2.0*   < > 2.8*  --  3.0* 3.9 3.0* 3.3*  CL 99   < > 103  --  104 103 103 105  CO2 11*   < > 17*  --  17* 22 21* 20*  GLUCOSE 181*   < > 157*  --  131* 149* 138* 139*  BUN 188*   < > 80*  --  73* 34* 32* 32*  CREATININE 10.60*   < > 5.31*  --  5.52* 2.98* 3.22* 3.51*  CALCIUM 7.4*   < > 7.7*  --  7.5* 7.6* 7.8* 8.1*  MG 2.3  --   --  1.7  --   --   --   --   PHOS  --    < > 1.2*  --  1.4* 1.5* 3.7 3.2   < > = values in this interval not displayed.   GFR: Estimated  Creatinine Clearance: 19.6 mL/min (A) (by C-G formula based on SCr of 3.51 mg/dL (H)). Liver Function Tests: Recent Labs  Lab 10/29/21 1700 10/30/21 0423 10/30/21 1340 10/30/21 2122 10/31/21 0528 10/31/21 1328 11/01/21 0432 11/03/21 0430  AST 24 19  --   --   --   --   --   --   ALT 14 10  --   --   --   --   --   --   ALKPHOS 104 86  --   --   --   --   --   --   BILITOT 0.9 0.8  --   --   --   --   --   --   PROT 6.4* 5.2*  --   --   --   --   --   --   ALBUMIN 2.3* 1.9*   < > 1.8* 1.7* 1.7* 1.7* 1.6*   < > = values in this interval not displayed.   No results for input(s): LIPASE, AMYLASE in the last 168 hours. No results for input(s): AMMONIA in the last 168 hours. Coagulation Profile: Recent Labs  Lab 10/29/21 1751  INR 1.4*   Cardiac Enzymes: No results for input(s): CKTOTAL, CKMB, CKMBINDEX, TROPONINI in the last 168 hours. BNP (last 3 results) No results for input(s): PROBNP in the last 8760 hours. HbA1C: No results for input(s): HGBA1C in the last 72 hours.  CBG: Recent Labs  Lab 11/02/21 1612  11/02/21 2048 11/03/21 0741 11/03/21 1131 11/03/21 1712  GLUCAP 120* 168* 128* 198* 101*   Lipid Profile: No results for input(s): CHOL, HDL, LDLCALC, TRIG, CHOLHDL, LDLDIRECT in the last 72 hours. Thyroid Function Tests: No results for input(s): TSH, T4TOTAL, FREET4, T3FREE, THYROIDAB in the last 72 hours. Anemia Panel: No results for input(s): VITAMINB12, FOLATE, FERRITIN, TIBC, IRON, RETICCTPCT in the last 72 hours.  Urine analysis:    Component Value Date/Time   COLORURINE GREEN (A) 10/29/2021 2005   APPEARANCEUR CLOUDY (A) 10/29/2021 2005   LABSPEC 1.020 10/29/2021 2005   PHURINE 6.0 10/29/2021 2005   GLUCOSEU NEGATIVE 10/29/2021 2005   HGBUR SMALL (A) 10/29/2021 2005   BILIRUBINUR NEGATIVE 10/29/2021 2005   KETONESUR 20 (A) 10/29/2021 2005   PROTEINUR 100 (A) 10/29/2021 2005   NITRITE POSITIVE (A) 10/29/2021 2005   LEUKOCYTESUR LARGE (A) 10/29/2021  2005   Sepsis Labs: @LABRCNTIP (procalcitonin:4,lacticidven:4)  ) Recent Results (from the past 240 hour(s))  Resp Panel by RT-PCR (Flu A&B, Covid) Nasopharyngeal Swab     Status: None   Collection Time: 10/29/21  5:27 PM   Specimen: Nasopharyngeal Swab; Nasopharyngeal(NP) swabs in vial transport medium  Result Value Ref Range Status   SARS Coronavirus 2 by RT PCR NEGATIVE NEGATIVE Final    Comment: (NOTE) SARS-CoV-2 target nucleic acids are NOT DETECTED.  The SARS-CoV-2 RNA is generally detectable in upper respiratory specimens during the acute phase of infection. The lowest concentration of SARS-CoV-2 viral copies this assay can detect is 138 copies/mL. A negative result does not preclude SARS-Cov-2 infection and should not be used as the sole basis for treatment or other patient management decisions. A negative result may occur with  improper specimen collection/handling, submission of specimen other than nasopharyngeal swab, presence of viral mutation(s) within the areas targeted by this assay, and inadequate number of viral copies(<138 copies/mL). A negative result must be combined with clinical observations, patient history, and epidemiological information. The expected result is Negative.  Fact Sheet for Patients:  EntrepreneurPulse.com.au  Fact Sheet for Healthcare Providers:  IncredibleEmployment.be  This test is no t yet approved or cleared by the [place] FDA and  has been authorized for detection and/or diagnosis of SARS-CoV-2 by FDA under an Emergency Use Authorization (EUA). This EUA will remain  in effect (meaning this test can be used) for the duration of the COVID-19 declaration under Section 564(b)(1) of the Act, 21 U.S.C.section 360bbb-3(b)(1), unless the authorization is terminated  or revoked sooner.       Influenza A by PCR NEGATIVE NEGATIVE Final   Influenza B by PCR NEGATIVE NEGATIVE Final    Comment:  (NOTE) The Xpert Xpress SARS-CoV-2/FLU/RSV plus assay is intended as an aid in the diagnosis of influenza from Nasopharyngeal swab specimens and should not be used as a sole basis for treatment. Nasal washings and aspirates are unacceptable for Xpert Xpress SARS-CoV-2/FLU/RSV testing.  Fact Sheet for Patients: EntrepreneurPulse.com.au  Fact Sheet for Healthcare Providers: IncredibleEmployment.be  This test is not yet approved or cleared by the [place] FDA and has been authorized for detection and/or diagnosis of SARS-CoV-2 by FDA under an Emergency Use Authorization (EUA). This EUA will remain in effect (meaning this test can be used) for the duration of the COVID-19 declaration under Section 564(b)(1) of the Act, 21 U.S.C. section 360bbb-3(b)(1), unless the authorization is terminated or revoked.  Performed at [facility], [address], [place], [place] [address]   Culture, blood (routine x 2)     Status:  None (Preliminary result)   Collection Time: 10/29/21  5:51 PM   Specimen: BLOOD  Result Value Ref Range Status   Specimen Description BLOOD BLOOD RIGHT ARM  Final   Special Requests   Final    BOTTLES DRAWN AEROBIC AND ANAEROBIC Blood Culture adequate volume   Culture   Final    NO GROWTH 4 DAYS Performed at [facility], [address] [place], [place] [address]    Report Status PENDING  Incomplete  Culture, blood (routine x 2)     Status: None (Preliminary result)   Collection Time: 10/29/21  9:19 PM   Specimen: BLOOD  Result Value Ref Range Status   Specimen Description BLOOD central line  Final   Special Requests   Final    BOTTLES DRAWN AEROBIC AND ANAEROBIC Blood Culture adequate volume   Culture   Final    NO GROWTH 4 DAYS Performed at [facility], [address] [place], [place] [address]    Report Status PENDING  Incomplete  MRSA Next Gen by PCR, Nasal     Status: Abnormal   Collection Time: 10/30/21  1:52 AM    Specimen: Nasal Mucosa; Nasal Swab  Result Value Ref Range Status   MRSA by PCR Next Gen DETECTED (A) NOT DETECTED Final    Comment: RESULT CALLED TO, READ BACK BY AND VERIFIED WITH: ASHLEY @ 1055 ON [id number] BY HENDERSON L (NOTE) The GeneXpert MRSA Assay (FDA approved for NASAL specimens only), is one component of a comprehensive MRSA colonization surveillance program. It is not intended to diagnose MRSA infection nor to guide or monitor treatment for MRSA infections. Test performance is not FDA approved in patients less than 45 years old. Performed at [facility], [address] [place], [place] [address]   Gastrointestinal Panel by PCR , Stool     Status: None   Collection Time: 10/30/21  5:35 PM   Specimen: Rectum; Stool  Result Value Ref Range Status   Campylobacter species NOT DETECTED NOT DETECTED Final   Plesimonas shigelloides NOT DETECTED NOT DETECTED Final   Salmonella species NOT DETECTED NOT DETECTED Final   Yersinia enterocolitica NOT DETECTED NOT DETECTED Final   Vibrio species NOT DETECTED NOT DETECTED Final   Vibrio cholerae NOT DETECTED NOT DETECTED Final   Enteroaggregative E coli (EAEC) NOT DETECTED NOT DETECTED Final   Enteropathogenic E coli (EPEC) NOT DETECTED NOT DETECTED Final   Enterotoxigenic E coli (ETEC) NOT DETECTED NOT DETECTED Final   Shiga like toxin producing E coli (STEC) NOT DETECTED NOT DETECTED Final   Shigella/Enteroinvasive E coli (EIEC) NOT DETECTED NOT DETECTED Final   Cryptosporidium NOT DETECTED NOT DETECTED Final   Cyclospora cayetanensis NOT DETECTED NOT DETECTED Final   Entamoeba histolytica NOT DETECTED NOT DETECTED Final   Giardia lamblia NOT DETECTED NOT DETECTED Final   Adenovirus F40/41 NOT DETECTED NOT DETECTED Final   Astrovirus NOT DETECTED NOT DETECTED Final   Norovirus GI/GII NOT DETECTED NOT DETECTED Final   Rotavirus A NOT DETECTED NOT DETECTED Final   Sapovirus (I, II, IV, and V) NOT DETECTED NOT DETECTED Final     Comment: Performed at [facility], [address] [place], [place] [address]  C Difficile Quick Screen w PCR reflex     Status: None   Collection Time: 10/30/21  5:35 PM   Specimen: Rectum; Stool  Result Value Ref Range Status   C Diff antigen NEGATIVE NEGATIVE Final   C Diff toxin NEGATIVE NEGATIVE Final  C Diff interpretation No C. difficile detected.  Final    Comment: Performed at [facility], [address] [place], [place] [address]  Urine Culture     Status: None   Collection Time: 11/01/21  8:20 AM   Specimen: Urine, Catheterized  Result Value Ref Range Status   Specimen Description   Final    URINE, CATHETERIZED Performed at [facility], [address] [place], [place] [address]    Special Requests   Final    NONE Performed at [facility], [address] [place], [place] [address]    Culture   Final    NO GROWTH Performed at [facility], [place] [address], [place],  [address]    Report Status 11/02/2021 FINAL  Final    Radiology Studies: DG CHEST PORT 1 VIEW  Result Date: 11/03/2021 CLINICAL DATA:  Shortness of breath EXAM: PORTABLE CHEST 1 VIEW COMPARISON:  10/30/2021 FINDINGS: Low lung volumes with minimal right base atelectasis. Bilateral internal jugular central line remain in place, unchanged. Heart is normal size. No effusions. No acute bony abnormality. Degenerative changes in the shoulders and thoracic spine. IMPRESSION: Low lung volumes.  Minimal right base atelectasis. Electronically Signed   By: Rolm Baptise M.D.   On: 11/03/2021 10:20     Scheduled Meds:  sodium chloride   Intravenous Once   Chlorhexidine Gluconate Cloth  6 each Topical Daily   Chlorhexidine Gluconate Cloth  6 each Topical Q0600   Chlorhexidine Gluconate Cloth  6 each Topical Q0600   darbepoetin (ARANESP) injection - DIALYSIS  40 mcg Subcutaneous Q Mon-HD   enoxaparin (LOVENOX) injection  30 mg Subcutaneous Q24H   feeding supplement (NEPRO CARB STEADY)  237 mL  Oral TID PC   insulin aspart  0-5 Units Subcutaneous QHS   insulin aspart  0-9 Units Subcutaneous TID WC   midodrine  10 mg Oral TID WC   mupirocin ointment   Nasal BID   Continuous Infusions:  sodium chloride Stopped (10/29/21 2251)   sodium chloride     sodium chloride     albumin human     ceFEPime (MAXIPIME) IV Stopped (11/02/21 2128)   lactated ringers 75 mL/hr at 11/03/21 1856   linezolid (ZYVOX) IV Stopped (11/03/21 1051)   norepinephrine (LEVOPHED) Adult infusion 3 mcg/min (11/03/21 1856)    LOS: 4 days   Roxan Hockey M.D on 11/03/2021 at 7:01 PM  Go to [email or web address] - for contact info  [facility] - Office  [phone]  If 7PM-7AM, please contact night-coverage [email or web address] Password [email or web address] 11/03/2021, 7:01 PM

## 2021-11-03 NOTE — Progress Notes (Addendum)
Nutrition Follow up  INTERVENTION:  [place] BID- encourage intake ONS due to pt poor meal intake   Assist with feeding as needed to maximize oral intake  Offer fluids between meals  Recommend renal vitamin  NUTRITION DIAGNOSIS:   Inadequate oral intake related to inability to eat as evidenced by NPO status (pt unsafe for po's currently per nursing).  -progressing slowly becoming more alert  GOAL:  Provide needs based on ASPEN/SCCM guidelines  -ongoing. If poor po continues consider revisiting NGT placement and enteral feeding.  MONITOR:  Diet advancement, Supplement acceptance, Labs, I & O's, Weight trends  ASSESSMENT: Patient is a 55 yo female with hx of CKD-IV, DM2, gout and nephrectomy. She presents from SNF with anemia. Septic shock- UTI. Metabolic acidosis, hyponatremia, hypokalemia.   Surgery consulted for placement of non tunneled catheter for dialysis. HD planned for today and on 12/17 per nephrology.   Patient vomited this morning after taking potassium. NPO - not alert enough to safely take meds this morning per nursing. Patient discussed with nursing and during progression. She has received multiple runs of potassium. No skin breakdown.   Medications reviewed and include:  insulin, potassium chloride.   Per chart review: 12/16- pt refused NGT.  12/17- HD session completed per RN. 12/18 Received 1 unit PRBC  12/20- Talked with pt and nursing. Patient had vomiting episode yesterday evening after drinking ONS. No problem with tolerating meals. She was assisted with lunch yesterday and at breakfast this morning she ate grits with butter and drank soft drink. At lunch today pt was able feed herself and is more alert per staff. She continues on pressors- MAP is 85 at 12:15.   Admission assessment wt. 75.4 kg. Current wt 85.4 kg.  Will follow for EDW as she receives HD.   Drip : Vancomycin   10/31/21- IVF- Lactated Ringers @ 75 ml/hr.   CBG (last 3) A1C-5.2% within  desirable range Recent Labs    11/02/21 2048 11/03/21 0741 11/03/21 1131  GLUCAP 168* 128* 198*     Labs: Potassium and phosphorus repletion  BMP Latest Ref Rng & Units 11/03/2021 11/02/2021 11/01/2021  Glucose 70 - 99 mg/dL 139(H) 138(H) 149(H)  BUN 6 - 20 mg/dL 32(H) 32(H) 34(H)  Creatinine 0.44 - 1.00 mg/dL 3.51(H) 3.22(H) 2.98(H)  Sodium 135 - 145 mmol/L 134(L) 134(L) 135  Potassium 3.5 - 5.1 mmol/L 3.3(L) 3.0(L) 3.9  Chloride 98 - 111 mmol/L 105 103 103  CO2 22 - 32 mmol/L 20(L) 21(L) 22  Calcium 8.9 - 10.3 mg/dL 8.1(L) 7.8(L) 7.6(L)      NUTRITION - FOCUSED PHYSICAL EXAM:  Flowsheet Row Most Recent Value  Orbital Region Mild depletion  Upper Arm Region No depletion  Thoracic and Lumbar Region No depletion  Buccal Region No depletion  Temple Region No depletion  Clavicle Bone Region No depletion  Clavicle and Acromion Bone Region Mild depletion  Scapular Bone Region Unable to assess  Dorsal Hand No depletion  Patellar Region No depletion  Edema (RD Assessment) Mild  Hair Reviewed  Eyes Reviewed  Mouth Reviewed  [lips dry and cracked]  Skin Reviewed  Nails Reviewed      Diet Order:   Diet Order             DIET SOFT Room service appropriate? Yes; Fluid consistency: Thin  Diet effective now                   EDUCATION NEEDS:  Not appropriate for education at  this time  Skin:  Skin Assessment: Reviewed RN Assessment. Recent hx of Stage 2 to buttocks (09/01/21).  -Chronic breast wound (left) per MD   Last BM:  12/20 diarrhea. 12/19 output 475 ml.  Patient has rectal tube.   Height:   Ht Readings from Last 1 Encounters:  10/29/21 5\' 4"  (1.626 m)    Weight:   Wt Readings from Last 1 Encounters:  11/03/21 85.4 kg    Ideal Body Weight:   55 kg  BMI:  Body mass index is 32.32 kg/m.  Estimated Nutritional Needs:   Kcal:  1800-2000  Protein:  105-110 gr  Fluid:  per MD goal   Colman Cater MS,RD,CSG,LDN Contact: Shea Evans

## 2021-11-04 DIAGNOSIS — E46 Unspecified protein-calorie malnutrition: Secondary | ICD-10-CM

## 2021-11-04 DIAGNOSIS — N184 Chronic kidney disease, stage 4 (severe): Secondary | ICD-10-CM

## 2021-11-04 DIAGNOSIS — E1122 Type 2 diabetes mellitus with diabetic chronic kidney disease: Secondary | ICD-10-CM

## 2021-11-04 DIAGNOSIS — G9341 Metabolic encephalopathy: Secondary | ICD-10-CM

## 2021-11-04 DIAGNOSIS — E8809 Other disorders of plasma-protein metabolism, not elsewhere classified: Secondary | ICD-10-CM

## 2021-11-04 DIAGNOSIS — E8729 Other acidosis: Secondary | ICD-10-CM

## 2021-11-04 DIAGNOSIS — E860 Dehydration: Secondary | ICD-10-CM

## 2021-11-04 LAB — RENAL FUNCTION PANEL
Albumin: 1.9 g/dL — ABNORMAL LOW (ref 3.5–5.0)
Anion gap: 7 (ref 5–15)
BUN: 30 mg/dL — ABNORMAL HIGH (ref 6–20)
CO2: 20 mmol/L — ABNORMAL LOW (ref 22–32)
Calcium: 8.2 mg/dL — ABNORMAL LOW (ref 8.9–10.3)
Chloride: 107 mmol/L (ref 98–111)
Creatinine, Ser: 3.4 mg/dL — ABNORMAL HIGH (ref 0.44–1.00)
GFR, Estimated: 16 mL/min — ABNORMAL LOW (ref >60)
Glucose, Bld: 125 mg/dL — ABNORMAL HIGH (ref 70–99)
Phosphorus: 3 mg/dL (ref 2.5–4.6)
Potassium: 4.6 mmol/L (ref 3.5–5.1)
Sodium: 134 mmol/L — ABNORMAL LOW (ref 135–145)

## 2021-11-04 LAB — GLUCOSE, CAPILLARY
Glucose-Capillary: 101 mg/dL — ABNORMAL HIGH (ref 70–99)
Glucose-Capillary: 174 mg/dL — ABNORMAL HIGH (ref 70–99)
Glucose-Capillary: 95 mg/dL (ref 70–99)
Glucose-Capillary: 92 mg/dL (ref 70–99)

## 2021-11-04 LAB — CBC
HCT: 22.7 % — ABNORMAL LOW (ref 36.0–46.0)
Hemoglobin: 7.2 g/dL — ABNORMAL LOW (ref 12.0–15.0)
MCH: 27 pg (ref 26.0–34.0)
MCHC: 31.7 g/dL (ref 30.0–36.0)
MCV: 85 fL (ref 80.0–100.0)
Platelets: 89 10*3/uL — ABNORMAL LOW (ref 150–400)
RBC: 2.67 MIL/uL — ABNORMAL LOW (ref 3.87–5.11)
RDW: 18 % — ABNORMAL HIGH (ref 11.5–15.5)
WBC: 14.8 10*3/uL — ABNORMAL HIGH (ref 4.0–10.5)
nRBC: 0 % (ref 0.0–0.2)

## 2021-11-04 LAB — PROTEIN ELECTROPHORESIS, SERUM
A/G Ratio: 0.8 (ref 0.7–1.7)
Albumin ELP: 2.2 g/dL — ABNORMAL LOW (ref 2.9–4.4)
Alpha-1-Globulin: 0.3 g/dL (ref 0.0–0.4)
Alpha-2-Globulin: 0.4 g/dL (ref 0.4–1.0)
Beta Globulin: 0.8 g/dL (ref 0.7–1.3)
Gamma Globulin: 1.2 g/dL (ref 0.4–1.8)
Globulin, Total: 2.8 g/dL (ref 2.2–3.9)
Total Protein ELP: 5 g/dL — ABNORMAL LOW (ref 6.0–8.5)

## 2021-11-04 LAB — CULTURE, BLOOD (ROUTINE X 2)
Culture: NO GROWTH
Culture: NO GROWTH
Special Requests: ADEQUATE
Special Requests: ADEQUATE

## 2021-11-04 LAB — CORTISOL-AM, BLOOD: Cortisol - AM: 6.5 ug/dL — ABNORMAL LOW (ref 6.7–22.6)

## 2021-11-04 MED ORDER — DARBEPOETIN ALFA 40 MCG/0.4ML IJ SOSY
40.0000 ug | PREFILLED_SYRINGE | INTRAMUSCULAR | Status: DC
  Administered 2021-11-04: 18:00:00 40 ug via SUBCUTANEOUS
  Filled 2021-11-04: qty 0.4

## 2021-11-04 MED ORDER — SODIUM BICARBONATE 650 MG PO TABS
650.0000 mg | ORAL_TABLET | Freq: Two times a day (BID) | ORAL | Status: DC
  Administered 2021-11-04 – 2021-11-05 (×2): 650 mg via ORAL
  Filled 2021-11-04 (×2): qty 1

## 2021-11-04 NOTE — Progress Notes (Signed)
Admit: 10/29/2021 LOS: 5  88F AoCKD (BL SCr ~2), profoundly malnourished, shock  Subjective:  No acute events. Hypotensive with MAP <65 overnight, levo increased. Received albumin yesterday. UOP ~0.9L. Remains on LR. RIJ temp line removed yesterday.  12/20 0701 - 12/21 0700 In: 3629.7 [P.O.:1090; I.V.:1806.7; IV Piggyback:733] Out: 965 [Urine:965]  Filed Weights   11/02/21 0900 11/03/21 0443 11/04/21 0516  Weight: 87.1 kg 85.4 kg 89.3 kg    Scheduled Meds:  sodium chloride   Intravenous Once   Chlorhexidine Gluconate Cloth  6 each Topical Daily   Chlorhexidine Gluconate Cloth  6 each Topical Q0600   Chlorhexidine Gluconate Cloth  6 each Topical Q0600   [START ON 11/09/2021] darbepoetin (ARANESP) injection - NON-DIALYSIS  40 mcg Subcutaneous Q Mon-1800   enoxaparin (LOVENOX) injection  30 mg Subcutaneous Q24H   feeding supplement (NEPRO CARB STEADY)  237 mL Oral TID PC   insulin aspart  0-5 Units Subcutaneous QHS   insulin aspart  0-9 Units Subcutaneous TID WC   midodrine  10 mg Oral TID WC   mupirocin ointment   Nasal BID   Continuous Infusions:  sodium chloride Stopped (10/29/21 2251)   ceFEPime (MAXIPIME) IV Stopped (11/03/21 2102)   lactated ringers 75 mL/hr at 11/04/21 0752   linezolid (ZYVOX) IV Stopped (11/03/21 2244)   norepinephrine (LEVOPHED) Adult infusion 6 mcg/min (11/04/21 0752)   PRN Meds:.alteplase, loperamide, oxyCODONE, pentafluoroprop-tetrafluoroeth, prochlorperazine  Current Labs: reviewed   Latest Reference Range & Units 10/30/21 04:23  Saturation Ratios 10.4 - 31.8 % 71 (H)  Ferritin 11 - 307 ng/mL 1,080 (H)  (H): Data is abnormally high  Physical Exam:  Blood pressure 107/66, pulse 96, temperature 97.7 F (36.5 C), resp. rate 19, height 5\' 4"  (1.626 m), weight 89.3 kg, last menstrual period 09/26/2015, SpO2 100 %. Gen: ill appearing, NAD HEENT: temporal wasting Cardiac: s1s2, rrr Resp: cta bl, on RA Abd: soft, nt/nd Ext: trace pedal  edema Neuro: nonfocal, slow to respond, aaox2 (to self and location only)  Recent Labs  Lab 11/02/21 0125 11/03/21 0430 11/04/21 0357  NA 134* 134* 134*  K 3.0* 3.3* 4.6  CL 103 105 107  CO2 21* 20* 20*  GLUCOSE 138* 139* 125*  BUN 32* 32* 30*  CREATININE 3.22* 3.51* 3.40*  CALCIUM 7.8* 8.1* 8.2*  PHOS 3.7 3.2 3.0   Recent Labs  Lab 10/29/21 1700 10/29/21 2236 11/02/21 0125 11/03/21 0430 11/04/21 0357  WBC 17.8*   < > 18.8* 15.4* 14.8*  NEUTROABS 15.6*  --   --   --   --   HGB 7.1*   < > 8.2* 7.9* 7.2*  HCT 21.0*   < > 24.9* 23.7* 22.7*  MCV 78.4*   < > 81.6 82.0 85.0  PLT 239   < > 127* 99* 89*   < > = values in this interval not displayed.    A AoCKD3 (BL SCr ~2) in context of #3 and #4; Req HD 12/16 and 12/17.  Seems to have recovery of GFR with hydration and supportive care but SCr now stalled above historical baseline, time will tell where her kidney function settles out to. R IJ HD temp cath removed 12/20.  Nonoliguric. Solitary L kidney, hx/o R nephrectomy; no [place] since 08/2021 Shock on NE, Bcx+Ucx NGTD; Zyvox/Cefepime Malnourished/protein calorie malnutrition with hypokalemia, hypophosphatemia Anemia, on ESA. FOBT neg on admit. Transfuse PRN for hgb <7 Metabolic acidosis 2/2 #1 Acute metabolic encephalopathy Diarrhea DM2- per primary  P Cr stable at  3.4 today, remains nonoliguric. No indication for renal replacement therapy today. If this is needed down the road, then would prefer tunneled dialysis catheter to be placed C/w supportive care, would c/w LR for today especially as appetite still seems to be low. Pressor support per primary service,  consider midodrine trial Will start nahco3 ESA changed, first dose today Medication Issues; Preferred narcotic agents for pain control are hydromorphone, fentanyl, and methadone. Morphine should not be used.  Baclofen should be avoided Avoid oral sodium phosphate and magnesium citrate based laxatives / bowel preps    Gean Quint, MD [place]

## 2021-11-04 NOTE — Progress Notes (Signed)
PROGRESS NOTE     Courtney Dyer, is a 55 y.o. female, DOB - 05/11/1966, YTK:[id number]  Admit date - 10/29/2021   Admitting Physician Bernadette Hoit, DO  Outpatient Primary MD for the patient is Pcp, No  LOS - 5  No chief complaint on file.     Altered mentation  Brief Narrative:  55 y.o. female with medical history significant for hyperlipidemia, T2DM, gout, GERD , HTN, CKD IV admitted on 10/30/2021 with AKI on CKD stage IV, severe electrolyte derangement, and persistent  severe dehydration partly due to persistent nausea and vomiting with persistent hypotension requiring IV fluids on IV Levophed for pressure support in the setting of sepsis and dehydration  Assessment & Plan:   Principal Problem:   Sepsis secondary to UTI [facility]) Active Problems:   Acute kidney injury ([place])   Hypokalemia   Symptomatic anemia   Hyponatremia   High anion gap metabolic acidosis   Dehydration   Leukocytosis   Microcytic anemia   Hypoalbuminemia due to protein-calorie malnutrition (HCC)   Acute metabolic encephalopathy   Mixed hyperlipidemia   Type 2 diabetes mellitus (HCC)   Gout   Prolonged QT interval   Obesity, Class III, BMI 40-49.9 (morbid obesity) ([place])   1)Persistent Hypotension/hemodynamic instability--- in the setting of dehydration and presumed sepsis -continues to require IV Levophed for pressure support (levophed at 6 mg/hr) -Continues to require IV fluids -Latest concerns for UTI and pneumonia. -Continue Zyvox and cefepime. -Continue 3 times daily midodrine with hope to wean off Levophed -Blood, urine and GI cultures remain negative to date -Cortisol stable. -IV albumin provided on 11/03/2021.   -Continue IV fluids as per nephrology recommendations for the next 24 hours; patient has started to show 3rd spacing--appears intravascularly volume depleted  2)AKi on CKD IV with anion gap metabolic acidosis--- suspect ATN --patient with significant dehydration due to  persistent nausea and vomiting, also suspect kidney injury secondary to persistent hypotension -Solitary L kidney, hx/o R nephrectomy;  - CT a/p with dysplastic right kidney and no stones or hydronephrosis on the left.   creatinine was around 10, BUN around 160 -Scr around 3-3.4 currently. -Still making urine. -Creatinine trending back up, urine output trending down, -Continue IV fluids and follow further recommendations by nephrology service. -Nephrology input appreciated, right neck temporary HD catheter removed on 11/03/2021 per nephrology; if further dialysis required will need to be done through tunneled catheter.  3) acute on chronic anemia--- no evidence of ongoing bleeding suspect some component of chronic anemia of CKD -Serum iron is 81 iron saturation 71, ferritin is not low,  -B12 and folate are stable. -Transition threshold/ball is for Hgb > 7 -Patient has received a total of 2 units of PRBC during this admission -ESA treatment as per nephrology service.  4)Chronic breast wound--- does not appear infected, Healing left breast periareolar wound--please see ultrasound report from 10/27/2021. -Continue wound care.  5)Hypokalemia/hyponatremia/hypophosphatemia--electrolyte derangement due to persistent diarrhea and poor oral intake -Continue to replete electrolytes as needed; follow trend.  6)DM2- A1C 5.2 reflecting excellent diabetic control PTA -Continue the use of Novolog/Humalog Sliding scale insulin with Accu-Cheks/Fingersticks as ordered   7)Social/Ethics--patient's aunt [name] is primary contact and decision maker [phone]) -Patient remains a full code -Overall prognosis is guarded -Palliative care has been consulted.  8)Sepsis with Septic Shock--- suspect related to UTI and pneumonia -PCT 0.66 -WBC 24.1>>19.2>>18.8>>15.4 -Culture data and antibiotics as above #1  9) Acute metabolic encephalopathy secondary to sepsis with septic shock and persistent  hypotension, dehydration, worsening renal function and electrolyte and metabolic derangement as mentioned above -Improved with hemodialysis; patient is still no eating/drinking mentation back to baseline yet.  10)Diarrhea-- C diff and GI pathogen/culture is Negative -Rectal tube placed continue IV fluids, continue to replace electrolytes as needed -Okay to use Imodium and Lomotil as needed  CRITICAL CARE Performed by: Barton Dubois   Total critical care time: 45 minutes  Critical care time was exclusive of separately billable procedures and treating other patients.  Critical care was necessary to treat or prevent imminent or life-threatening deterioration.  Critical care was time spent personally by me on the following activities: development of treatment plan with patient and/or surrogate as well as nursing, discussions with consultants, evaluation of patient's response to treatment, examination of patient, obtaining history from patient or surrogate, ordering and performing treatments and interventions, ordering and review of laboratory studies, ordering and review of radiographic studies, pulse oximetry and re-evaluation of patient's condition.   Disposition/Need for in-Hospital Stay- patient unable to be discharged at this time due to -Persistent hypotension requiring IV Levophed for pressure support -Ongoing acute on chronic renal failure. -Patient will return to [facility] SN[facility] once medically improved  Status is: Inpatient  Remains inpatient appropriate because: As above  Disposition: The patient is from: Long-term SNF resident              Anticipated d/c is to: SNF              Anticipated d/c date is: > 3 days              Patient currently is not medically stable to d/c.   Barriers: Not Clinically Stable-   Code Status :  -  Code Status: Full Code   Family Communication:   patient's aunt [name] is primary contact and decision maker [phone]).  Consults  :  Nephrology/general surgery/palliative care  DVT Prophylaxis  :   - SCDs  enoxaparin (LOVENOX) injection 30 mg Start: 10/30/21 1000 SCDs Start: 10/30/21 0152  Lab Results  Component Value Date   PLT 89 (L) 11/04/2021    Inpatient Medications  Scheduled Meds:  sodium chloride   Intravenous Once   Chlorhexidine Gluconate Cloth  6 each Topical Daily   Chlorhexidine Gluconate Cloth  6 each Topical Q0600   Chlorhexidine Gluconate Cloth  6 each Topical Q0600   darbepoetin (ARANESP) injection - NON-DIALYSIS  40 mcg Subcutaneous Q Mon-1800   enoxaparin (LOVENOX) injection  30 mg Subcutaneous Q24H   feeding supplement (NEPRO CARB STEADY)  237 mL Oral TID PC   insulin aspart  0-5 Units Subcutaneous QHS   insulin aspart  0-9 Units Subcutaneous TID WC   midodrine  10 mg Oral TID WC   mupirocin ointment   Nasal BID   sodium bicarbonate  650 mg Oral BID   Continuous Infusions:  sodium chloride Stopped (10/29/21 2251)   ceFEPime (MAXIPIME) IV Stopped (11/03/21 2102)   lactated ringers 75 mL/hr at 11/04/21 1706   linezolid (ZYVOX) IV Stopped (11/04/21 1119)   norepinephrine (LEVOPHED) Adult infusion 5 mcg/min (11/04/21 1706)   PRN Meds:.alteplase, loperamide, oxyCODONE, pentafluoroprop-tetrafluoroeth, prochlorperazine   Anti-infectives (From admission, onward)    Start     Dose/Rate Route Frequency Ordered Stop   11/02/21 2000  ceFEPIme (MAXIPIME) 2 g in sodium chloride 0.9 % 100 mL IVPB        2 g 200 mL/hr over 30 Minutes Intravenous Every 24 hours 11/02/21 1316  11/02/21 1800  linezolid (ZYVOX) IVPB 600 mg        600 mg 300 mL/hr over 60 Minutes Intravenous Every 12 hours 11/02/21 1247     10/31/21 1400  vancomycin (VANCOCIN) IVPB 750 mg/150 ml premix        750 mg 150 mL/hr over 60 Minutes Intravenous Every Dialysis 10/31/21 0835 10/31/21 1840   10/30/21 2000  vancomycin (VANCOREADY) IVPB 1500 mg/300 mL  Status:  Discontinued        1,500 mg 150 mL/hr over 120 Minutes  Intravenous Every 24 hours 10/29/21 1806 10/29/21 2007   10/30/21 2000  ceFEPIme (MAXIPIME) 1 g in sodium chloride 0.9 % 100 mL IVPB  Status:  Discontinued        1 g 200 mL/hr over 30 Minutes Intravenous Every 24 hours 10/29/21 2009 11/02/21 1316   10/30/21 1600  vancomycin (VANCOREADY) IVPB 750 mg/150 mL        750 mg 150 mL/hr over 60 Minutes Intravenous Every M-W-F (Hemodialysis) 10/30/21 1146 10/30/21 2006   10/30/21 1136  vancomycin variable dose per unstable renal function (pharmacist dosing)  Status:  Discontinued         Does not apply See admin instructions 10/30/21 1137 11/02/21 1247   10/30/21 0400  ceFEPIme (MAXIPIME) 2 g in sodium chloride 0.9 % 100 mL IVPB  Status:  Discontinued        2 g 200 mL/hr over 30 Minutes Intravenous Every 8 hours 10/29/21 1806 10/29/21 2007   10/29/21 2015  ceFEPIme (MAXIPIME) 1 g in sodium chloride 0.9 % 100 mL IVPB  Status:  Discontinued        1 g 200 mL/hr over 30 Minutes Intravenous Every 24 hours 10/29/21 2007 10/29/21 2009   10/29/21 1800  ceFEPIme (MAXIPIME) 2 g in sodium chloride 0.9 % 100 mL IVPB        2 g 200 mL/hr over 30 Minutes Intravenous  Once 10/29/21 1747 10/29/21 1852   10/29/21 1800  metroNIDAZOLE (FLAGYL) IVPB 500 mg        500 mg 100 mL/hr over 60 Minutes Intravenous  Once 10/29/21 1747 10/29/21 2004   10/29/21 1800  vancomycin (VANCOCIN) IVPB 1000 mg/200 mL premix        1,000 mg 200 mL/hr over 60 Minutes Intravenous  Once 10/29/21 1747 10/29/21 2149   10/29/21 1800  vancomycin (VANCOCIN) IVPB 1000 mg/200 mL premix        1,000 mg 200 mL/hr over 60 Minutes Intravenous  Once 10/29/21 1754 10/29/21 2100       Subjective: Candice Camp seen 2 days on examination; slowly melena/lethargic; with ongoing decreased oral intake and appetite.  No chest pain, no nausea, no vomiting.  Objective: Vitals:   11/04/21 1515 11/04/21 1530 11/04/21 1545 11/04/21 1600  BP: (!) 91/49 101/71 (!) 154/126 (!) 88/48  Pulse: 83 88 93 98   Resp: 20 16 15 13   Temp: (!) 97.5 F (36.4 C) (!) 97.5 F (36.4 C) (!) 97.5 F (36.4 C) (!) 97.5 F (36.4 C)  TempSrc:      SpO2: 100% 100% 100% 100%  Weight:      Height:        Intake/Output Summary (Last 24 hours) at 11/04/2021 1833 Last data filed at 11/04/2021 1800 Gross per 24 hour  Intake 2306.89 ml  Output 915 ml  Net 1391.89 ml   Filed Weights   11/02/21 0900 11/03/21 0443 11/04/21 0516  Weight: 87.1 kg 85.4 kg 89.3 kg  Physical Exam General exam: Somnolent/lethargic; per nursing staff intermittent episodes of crying spells.  Decreased oral intake and appetite.  Still making urine.  No fever, no chest pain, no nausea or vomiting reported. Respiratory system: No requiring oxygen supplementation; Cardiovascular system:RRR. No murmurs, rubs, gallops. Gastrointestinal system: Abdomen is nondistended, soft and nontender. No organomegaly or masses felt. Normal bowel sounds heard. Central nervous system: No focal neurological deficits appreciated; difficult examination in the setting of acute encephalopathic process.. Extremities: No cyanosis or clubbing; trace to 1+ edema appreciated bilaterally. Skin: No petechiae. Psychiatry: Judgement and insight appear impaired secondary to acute ongoing encephalopathy.  data Reviewed: I have personally reviewed following labs and imaging studies  CBC: Recent Labs  Lab 10/29/21 1700 10/29/21 2236 10/30/21 0423 11/01/21 0432 11/02/21 0125 11/03/21 0430 11/04/21 0357  WBC 17.8*   < > 24.1* 19.2* 18.8* 15.4* 14.8*  NEUTROABS 15.6*  --   --   --   --   --   --   HGB 7.1*   < > 7.4* 7.1* 8.2* 7.9* 7.2*  HCT 21.0*   < > 21.5* 21.0* 24.9* 23.7* 22.7*  MCV 78.4*   < > 78.5* 79.2* 81.6 82.0 85.0  PLT 239   < > 239 162 127* 99* 89*   < > = values in this interval not displayed.   Basic Metabolic Panel: Recent Labs  Lab 10/29/21 2119 10/30/21 0423 10/31/21 0738 10/31/21 1328 11/01/21 0432 11/02/21 0125 11/03/21 0430  11/04/21 0357  NA 126*   < >  --  129* 135 134* 134* 134*  K <2.0*   < >  --  3.0* 3.9 3.0* 3.3* 4.6  CL 99   < >  --  104 103 103 105 107  CO2 11*   < >  --  17* 22 21* 20* 20*  GLUCOSE 181*   < >  --  131* 149* 138* 139* 125*  BUN 188*   < >  --  73* 34* 32* 32* 30*  CREATININE 10.60*   < >  --  5.52* 2.98* 3.22* 3.51* 3.40*  CALCIUM 7.4*   < >  --  7.5* 7.6* 7.8* 8.1* 8.2*  MG 2.3  --  1.7  --   --   --   --   --   PHOS  --    < >  --  1.4* 1.5* 3.7 3.2 3.0   < > = values in this interval not displayed.   GFR: Estimated Creatinine Clearance: 20.7 mL/min (A) (by C-G formula based on SCr of 3.4 mg/dL (H)).  Liver Function Tests: Recent Labs  Lab 10/29/21 1700 10/30/21 0423 10/30/21 1340 10/31/21 0528 10/31/21 1328 11/01/21 0432 11/03/21 0430 11/04/21 0357  AST 24 19  --   --   --   --   --   --   ALT 14 10  --   --   --   --   --   --   ALKPHOS 104 86  --   --   --   --   --   --   BILITOT 0.9 0.8  --   --   --   --   --   --   PROT 6.4* 5.2*  --   --   --   --   --   --   ALBUMIN 2.3* 1.9*   < > 1.7* 1.7* 1.7* 1.6* 1.9*   < > = values in this interval  not displayed.   Coagulation Profile: Recent Labs  Lab 10/29/21 1751  INR 1.4*   CBG: Recent Labs  Lab 11/03/21 1712 11/03/21 2102 11/04/21 0736 11/04/21 1129 11/04/21 1544  GLUCAP 101* 106* 101* 174* 95   Urine analysis:    Component Value Date/Time   COLORURINE GREEN (A) 10/29/2021 2005   APPEARANCEUR CLOUDY (A) 10/29/2021 2005   LABSPEC 1.020 10/29/2021 2005   PHURINE 6.0 10/29/2021 2005   GLUCOSEU NEGATIVE 10/29/2021 2005   HGBUR SMALL (A) 10/29/2021 2005   BILIRUBINUR NEGATIVE 10/29/2021 2005   KETONESUR 20 (A) 10/29/2021 2005   PROTEINUR 100 (A) 10/29/2021 2005   NITRITE POSITIVE (A) 10/29/2021 2005   LEUKOCYTESUR LARGE (A) 10/29/2021 2005   Sepsis Labs:  Recent Results (from the past 240 hour(s))  Resp Panel by RT-PCR (Flu A&B, Covid) Nasopharyngeal Swab     Status: None   Collection Time:  10/29/21  5:27 PM   Specimen: Nasopharyngeal Swab; Nasopharyngeal(NP) swabs in vial transport medium  Result Value Ref Range Status   SARS Coronavirus 2 by RT PCR NEGATIVE NEGATIVE Final    Comment: (NOTE) SARS-CoV-2 target nucleic acids are NOT DETECTED.  The SARS-CoV-2 RNA is generally detectable in upper respiratory specimens during the acute phase of infection. The lowest concentration of SARS-CoV-2 viral copies this assay can detect is 138 copies/mL. A negative result does not preclude SARS-Cov-2 infection and should not be used as the sole basis for treatment or other patient management decisions. A negative result may occur with  improper specimen collection/handling, submission of specimen other than nasopharyngeal swab, presence of viral mutation(s) within the areas targeted by this assay, and inadequate number of viral copies(<138 copies/mL). A negative result must be combined with clinical observations, patient history, and epidemiological information. The expected result is Negative.  Fact Sheet for Patients:  EntrepreneurPulse.com.au  Fact Sheet for Healthcare Providers:  IncredibleEmployment.be  This test is no t yet approved or cleared by the [place] FDA and  has been authorized for detection and/or diagnosis of SARS-CoV-2 by FDA under an Emergency Use Authorization (EUA). This EUA will remain  in effect (meaning this test can be used) for the duration of the COVID-19 declaration under Section 564(b)(1) of the Act, 21 U.S.C.section 360bbb-3(b)(1), unless the authorization is terminated  or revoked sooner.       Influenza A by PCR NEGATIVE NEGATIVE Final   Influenza B by PCR NEGATIVE NEGATIVE Final    Comment: (NOTE) The Xpert Xpress SARS-CoV-2/FLU/RSV plus assay is intended as an aid in the diagnosis of influenza from Nasopharyngeal swab specimens and should not be used as a sole basis for treatment. Nasal washings  and aspirates are unacceptable for Xpert Xpress SARS-CoV-2/FLU/RSV testing.  Fact Sheet for Patients: EntrepreneurPulse.com.au  Fact Sheet for Healthcare Providers: IncredibleEmployment.be  This test is not yet approved or cleared by the [place] FDA and has been authorized for detection and/or diagnosis of SARS-CoV-2 by FDA under an Emergency Use Authorization (EUA). This EUA will remain in effect (meaning this test can be used) for the duration of the COVID-19 declaration under Section 564(b)(1) of the Act, 21 U.S.C. section 360bbb-3(b)(1), unless the authorization is terminated or revoked.  Performed at [facility], [address], [place], [place] [address]   Culture, blood (routine x 2)     Status: None   Collection Time: 10/29/21  5:51 PM   Specimen: BLOOD  Result Value Ref Range Status   Specimen Description BLOOD BLOOD RIGHT ARM  Final  Special Requests   Final    BOTTLES DRAWN AEROBIC AND ANAEROBIC Blood Culture adequate volume   Culture   Final    NO GROWTH 6 DAYS Performed at [facility], [address], [place], [place] [address]    Report Status 11/04/2021 FINAL  Final  Culture, blood (routine x 2)     Status: None   Collection Time: 10/29/21  9:19 PM   Specimen: BLOOD  Result Value Ref Range Status   Specimen Description BLOOD central line  Final   Special Requests   Final    BOTTLES DRAWN AEROBIC AND ANAEROBIC Blood Culture adequate volume   Culture   Final    NO GROWTH 6 DAYS Performed at [facility], [address] [place], [place] [address]    Report Status 11/04/2021 FINAL  Final  MRSA Next Gen by PCR, Nasal     Status: Abnormal   Collection Time: 10/30/21  1:52 AM   Specimen: Nasal Mucosa; Nasal Swab  Result Value Ref Range Status   MRSA by PCR Next Gen DETECTED (A) NOT DETECTED Final    Comment: RESULT CALLED TO, READ BACK BY AND VERIFIED WITH: ASHLEY @ 1055 ON [id number] BY HENDERSON L (NOTE) The GeneXpert  MRSA Assay (FDA approved for NASAL specimens only), is one component of a comprehensive MRSA colonization surveillance program. It is not intended to diagnose MRSA infection nor to guide or monitor treatment for MRSA infections. Test performance is not FDA approved in patients less than 70 years old. Performed at [facility], [address] [place], [place] [address]   Gastrointestinal Panel by PCR , Stool     Status: None   Collection Time: 10/30/21  5:35 PM   Specimen: Rectum; Stool  Result Value Ref Range Status   Campylobacter species NOT DETECTED NOT DETECTED Final   Plesimonas shigelloides NOT DETECTED NOT DETECTED Final   Salmonella species NOT DETECTED NOT DETECTED Final   Yersinia enterocolitica NOT DETECTED NOT DETECTED Final   Vibrio species NOT DETECTED NOT DETECTED Final   Vibrio cholerae NOT DETECTED NOT DETECTED Final   Enteroaggregative E coli (EAEC) NOT DETECTED NOT DETECTED Final   Enteropathogenic E coli (EPEC) NOT DETECTED NOT DETECTED Final   Enterotoxigenic E coli (ETEC) NOT DETECTED NOT DETECTED Final   Shiga like toxin producing E coli (STEC) NOT DETECTED NOT DETECTED Final   Shigella/Enteroinvasive E coli (EIEC) NOT DETECTED NOT DETECTED Final   Cryptosporidium NOT DETECTED NOT DETECTED Final   Cyclospora cayetanensis NOT DETECTED NOT DETECTED Final   Entamoeba histolytica NOT DETECTED NOT DETECTED Final   Giardia lamblia NOT DETECTED NOT DETECTED Final   Adenovirus F40/41 NOT DETECTED NOT DETECTED Final   Astrovirus NOT DETECTED NOT DETECTED Final   Norovirus GI/GII NOT DETECTED NOT DETECTED Final   Rotavirus A NOT DETECTED NOT DETECTED Final   Sapovirus (I, II, IV, and V) NOT DETECTED NOT DETECTED Final    Comment: Performed at [facility], [address] [place], [place] [address]  C Difficile Quick Screen w PCR reflex     Status: None   Collection Time: 10/30/21  5:35 PM   Specimen: Rectum; Stool  Result Value Ref Range Status   C Diff  antigen NEGATIVE NEGATIVE Final   C Diff toxin NEGATIVE NEGATIVE Final   C Diff interpretation No C. difficile detected.  Final    Comment: Performed at [facility], [address], [place], [place] [address]  Urine Culture     Status: None  Collection Time: 11/01/21  8:20 AM   Specimen: Urine, Catheterized  Result Value Ref Range Status   Specimen Description   Final    URINE, CATHETERIZED Performed at [facility], [address], [place], [place] [address]    Special Requests   Final    NONE Performed at [facility], [address] [place], [place] [address]    Culture   Final    NO GROWTH Performed at [facility], [place] [address] [place], [place] [address]    Report Status 11/02/2021 FINAL  Final    Radiology Studies: DG CHEST PORT 1 VIEW  Result Date: 11/03/2021 CLINICAL DATA:  Shortness of breath EXAM: PORTABLE CHEST 1 VIEW COMPARISON:  10/30/2021 FINDINGS: Low lung volumes with minimal right base atelectasis. Bilateral internal jugular central line remain in place, unchanged. Heart is normal size. No effusions. No acute bony abnormality. Degenerative changes in the shoulders and thoracic spine. IMPRESSION: Low lung volumes.  Minimal right base atelectasis. Electronically Signed   By: Rolm Baptise M.D.   On: 11/03/2021 10:20     Scheduled Meds:  sodium chloride   Intravenous Once   Chlorhexidine Gluconate Cloth  6 each Topical Daily   Chlorhexidine Gluconate Cloth  6 each Topical Q0600   Chlorhexidine Gluconate Cloth  6 each Topical Q0600   darbepoetin (ARANESP) injection - NON-DIALYSIS  40 mcg Subcutaneous Q Mon-1800   enoxaparin (LOVENOX) injection  30 mg Subcutaneous Q24H   feeding supplement (NEPRO CARB STEADY)  237 mL Oral TID PC   insulin aspart  0-5 Units Subcutaneous QHS   insulin aspart  0-9 Units Subcutaneous TID WC   midodrine  10 mg Oral TID WC   mupirocin ointment   Nasal BID   sodium bicarbonate  650 mg Oral BID   Continuous Infusions:  sodium  chloride Stopped (10/29/21 2251)   ceFEPime (MAXIPIME) IV Stopped (11/03/21 2102)   lactated ringers 75 mL/hr at 11/04/21 1706   linezolid (ZYVOX) IV Stopped (11/04/21 1119)   norepinephrine (LEVOPHED) Adult infusion 5 mcg/min (11/04/21 1706)    LOS: 5 days   Barton Dubois M.D on 11/04/2021 at 6:33 PM  Go to [email or web address] - for contact info  [facility] - Office  [phone]  If 7PM-7AM, please contact night-coverage [email or web address] Password [facility] 11/04/2021, 6:33 PM

## 2021-11-05 ENCOUNTER — Inpatient Hospital Stay (HOSPITAL_COMMUNITY): Payer: Medicare Other

## 2021-11-05 LAB — RENAL FUNCTION PANEL
Albumin: 1.6 g/dL — ABNORMAL LOW (ref 3.5–5.0)
Anion gap: 9 (ref 5–15)
BUN: 28 mg/dL — ABNORMAL HIGH (ref 6–20)
CO2: 16 mmol/L — ABNORMAL LOW (ref 22–32)
Calcium: 7.8 mg/dL — ABNORMAL LOW (ref 8.9–10.3)
Chloride: 111 mmol/L (ref 98–111)
Creatinine, Ser: 3.34 mg/dL — ABNORMAL HIGH (ref 0.44–1.00)
GFR, Estimated: 16 mL/min — ABNORMAL LOW (ref >60)
Glucose, Bld: 119 mg/dL — ABNORMAL HIGH (ref 70–99)
Phosphorus: 2.2 mg/dL — ABNORMAL LOW (ref 2.5–4.6)
Potassium: 3.2 mmol/L — ABNORMAL LOW (ref 3.5–5.1)
Sodium: 136 mmol/L (ref 135–145)

## 2021-11-05 LAB — CBC WITH DIFFERENTIAL/PLATELET
Abs Immature Granulocytes: 0.38 10*3/uL — ABNORMAL HIGH (ref 0.00–0.07)
Basophils Absolute: 0.1 10*3/uL (ref 0.0–0.1)
Basophils Relative: 1 %
Eosinophils Absolute: 0.4 10*3/uL (ref 0.0–0.5)
Eosinophils Relative: 2 %
HCT: 22.1 % — ABNORMAL LOW (ref 36.0–46.0)
Hemoglobin: 7.1 g/dL — ABNORMAL LOW (ref 12.0–15.0)
Immature Granulocytes: 2 %
Lymphocytes Relative: 11 %
Lymphs Abs: 2.3 10*3/uL (ref 0.7–4.0)
MCH: 27.1 pg (ref 26.0–34.0)
MCHC: 32.1 g/dL (ref 30.0–36.0)
MCV: 84.4 fL (ref 80.0–100.0)
Monocytes Absolute: 1.1 10*3/uL — ABNORMAL HIGH (ref 0.1–1.0)
Monocytes Relative: 5 %
Neutro Abs: 17.6 10*3/uL — ABNORMAL HIGH (ref 1.7–7.7)
Neutrophils Relative %: 79 %
Platelets: 85 10*3/uL — ABNORMAL LOW (ref 150–400)
RBC: 2.62 MIL/uL — ABNORMAL LOW (ref 3.87–5.11)
RDW: 17.9 % — ABNORMAL HIGH (ref 11.5–15.5)
WBC: 21.9 10*3/uL — ABNORMAL HIGH (ref 4.0–10.5)
nRBC: 0 % (ref 0.0–0.2)

## 2021-11-05 LAB — GLUCOSE, CAPILLARY
Glucose-Capillary: 106 mg/dL — ABNORMAL HIGH (ref 70–99)
Glucose-Capillary: 155 mg/dL — ABNORMAL HIGH (ref 70–99)
Glucose-Capillary: 126 mg/dL — ABNORMAL HIGH (ref 70–99)
Glucose-Capillary: 134 mg/dL — ABNORMAL HIGH (ref 70–99)

## 2021-11-05 MED ORDER — INFLUENZA VAC SPLIT QUAD 0.5 ML IM SUSY: 0.5000 mL | PREFILLED_SYRINGE | INTRAMUSCULAR | Status: DC

## 2021-11-05 MED ORDER — PNEUMOCOCCAL VAC POLYVALENT 25 MCG/0.5ML IJ INJ: 0.5000 mL | INJECTION | INTRAMUSCULAR | Status: DC

## 2021-11-05 MED ORDER — SODIUM BICARBONATE 650 MG PO TABS
1300.0000 mg | ORAL_TABLET | Freq: Three times a day (TID) | ORAL | Status: DC
  Administered 2021-11-05 – 2021-11-07 (×5): 1300 mg via ORAL
  Filled 2021-11-05 (×5): qty 2

## 2021-11-05 MED ORDER — HYDROCORTISONE SOD SUC (PF) 100 MG IJ SOLR
100.0000 mg | Freq: Three times a day (TID) | INTRAMUSCULAR | Status: DC
  Administered 2021-11-05 – 2021-11-08 (×10): 100 mg via INTRAVENOUS
  Filled 2021-11-05 (×10): qty 2

## 2021-11-05 MED ORDER — SODIUM CHLORIDE 0.9 % IV BOLUS
750.0000 mL | Freq: Once | INTRAVENOUS | Status: AC
  Administered 2021-11-05: 08:00:00 750 mL via INTRAVENOUS

## 2021-11-05 NOTE — Progress Notes (Addendum)
PROGRESS NOTE     Courtney Dyer, is a 55 y.o. female, DOB - 1966-02-13, XHB:[id number]  Admit date - 10/29/2021   Admitting Physician Bernadette Hoit, DO  Outpatient Primary MD for the patient is Pcp, No  LOS - 6  No chief complaint on file.     Altered mentation  Brief Narrative:  55 y.o. female with medical history significant for hyperlipidemia, T2DM, gout, GERD , HTN, CKD IV admitted on 10/30/2021 with AKI on CKD stage IV, severe electrolyte derangement, and persistent  severe dehydration partly due to persistent nausea and vomiting with persistent hypotension requiring IV fluids on IV Levophed for pressure support in the setting of sepsis and dehydration  Assessment & Plan:   Principal Problem:   Sepsis secondary to UTI [facility]) Active Problems:   Acute kidney injury ([place])   Hypokalemia   Symptomatic anemia   Hyponatremia   High anion gap metabolic acidosis   Dehydration   Leukocytosis   Microcytic anemia   Hypoalbuminemia due to protein-calorie malnutrition (HCC)   Acute metabolic encephalopathy   Mixed hyperlipidemia   Type 2 diabetes mellitus (HCC)   Gout   Prolonged QT interval   Obesity, Class III, BMI 40-49.9 (morbid obesity) ([place])   1)Persistent Hypotension/hemodynamic instability--- in the setting of dehydration and presumed sepsis -continues to require IV Levophed for pressure support (levophed at 5 mg/hr) -Continues to have low BP and is requiring pressors. -Latest concerns for UTI and pneumonia. -Continue Zyvox and cefepime. Day 8/10 -Continue 3 times daily midodrine with hope to wean off Levophed -cortisol is low and will add solucortef. -Blood, urine and GI cultures remain negative to date -IV albumin provided on 11/03/2021.   -Continue IV fluids as per nephrology recommendations for -patient is starting to demonstrate 3rd spacing.  2)AKi on CKD IV with anion gap metabolic acidosis--- suspect ATN --patient with significant dehydration due to  persistent nausea and vomiting, also suspect kidney injury secondary to persistent hypotension -Solitary L kidney, hx/o R nephrectomy;  - CT a/p with dysplastic right kidney and no stones or hydronephrosis on the left.   creatinine was around 10, BUN around 160 -Scr remains around 3-3.4 currently. -Still making urine. -Creatinine trending back up, urine output trending down, -Continue IV fluids and follow further recommendations by nephrology service. -Nephrology input appreciated, right neck temporary HD catheter removed on 11/03/2021 per nephrology; if further dialysis required will need to be done through tunneled catheter.  3) acute on chronic anemia--- no evidence of ongoing bleeding suspect some component of chronic anemia of CKD -Serum iron is 81 iron saturation 71, ferritin is not low,  -B12 and folate are stable. -Transition threshold/ball is for Hgb > 7 -Patient has received a total of 2 units of PRBC during this admission -ESA treatment as per nephrology service.  4)Chronic breast wound--- does not appear infected, Healing left breast periareolar wound--please see ultrasound report from 10/27/2021. -Continue wound care.  5)Hypokalemia/hyponatremia/hypophosphatemia--electrolyte derangement due to persistent diarrhea and poor oral intake -Continue to replete electrolytes as needed; follow trend.  6)DM2- A1C 5.2 reflecting excellent diabetic control PTA -Continue the use of Novolog/Humalog Sliding scale insulin with Accu-Cheks/Fingersticks as ordered   7)Social/Ethics--patient's aunt [name] is primary contact and decision maker [phone]) -Patient remains a full code -Overall prognosis is guarded -Palliative care has been consulted.  8)Sepsis with Septic Shock--- suspect related to UTI and pneumonia -PCT 0.66 -WBC 24.1>>19.2>>18.8>>15.4 -Culture data and antibiotics as above #1  9) Acute metabolic encephalopathy secondary to sepsis with  septic shock and  persistent hypotension, dehydration, worsening renal function and electrolyte and metabolic derangement as mentioned above -Improved with hemodialysis; patient is still no eating/drinking -mentation is not back to baseline yet. -will check CT scan.  10)Diarrhea-- C diff and GI pathogen/culture is Negative -Rectal tube placed continue IV fluids, continue to replace electrolytes as needed and provide supportive care -diarrhea continue. -Okay to use Imodium and Lomotil as needed  CRITICAL CARE Performed by: Barton Dubois   Total critical care time: 45 minutes  Critical care time was exclusive of separately billable procedures and treating other patients.  Critical care was necessary to treat or prevent imminent or life-threatening deterioration.  Critical care was time spent personally by me on the following activities: development of treatment plan with patient and/or surrogate as well as nursing, discussions with consultants, evaluation of patient's response to treatment, examination of patient, obtaining history from patient or surrogate, ordering and performing treatments and interventions, ordering and review of laboratory studies, ordering and review of radiographic studies, pulse oximetry and re-evaluation of patient's condition.   Disposition/Need for in-Hospital Stay- patient unable to be discharged at this time due to -Persistent hypotension requiring IV Levophed for pressure support -Ongoing acute on chronic renal failure. -Patient will return to [facility] SNF once medically improved  Status is: Inpatient  Remains inpatient appropriate because: As above  Disposition: The patient is from: Long-term SNF resident              Anticipated d/c is to: SNF              Anticipated d/c date is: > 3 days              Patient currently is not medically stable to d/c.   Barriers: Not Clinically Stable-   Code Status :  -  Code Status: Full Code   Family Communication:   patient's  aunt [name] is primary contact and decision maker [phone]).  Consults  : Nephrology/general surgery/palliative care -case discussed with Dr. Candiss Norse (nephrology) on 11/05/21  DVT Prophylaxis  :   - SCDs  enoxaparin (LOVENOX) injection 30 mg Start: 10/30/21 1000 SCDs Start: 10/30/21 0152  Lab Results  Component Value Date   PLT 85 (L) 11/05/2021    Inpatient Medications  Scheduled Meds:  sodium chloride   Intravenous Once   Chlorhexidine Gluconate Cloth  6 each Topical Daily   darbepoetin (ARANESP) injection - NON-DIALYSIS  40 mcg Subcutaneous Q Mon-1800   enoxaparin (LOVENOX) injection  30 mg Subcutaneous Q24H   feeding supplement (NEPRO CARB STEADY)  237 mL Oral TID PC   hydrocortisone sod succinate (SOLU-CORTEF) inj  100 mg Intravenous Q8H   insulin aspart  0-5 Units Subcutaneous QHS   insulin aspart  0-9 Units Subcutaneous TID WC   midodrine  10 mg Oral TID WC   mupirocin ointment   Nasal BID   sodium bicarbonate  650 mg Oral BID   Continuous Infusions:  sodium chloride Stopped (10/29/21 2251)   ceFEPime (MAXIPIME) IV Stopped (11/04/21 2137)   lactated ringers 75 mL/hr at 11/05/21 0323   linezolid (ZYVOX) IV 600 mg (11/05/21 1007)   norepinephrine (LEVOPHED) Adult infusion 5 mcg/min (11/05/21 0323)   PRN Meds:.alteplase, loperamide, oxyCODONE, pentafluoroprop-tetrafluoroeth, prochlorperazine   Anti-infectives (From admission, onward)    Start     Dose/Rate Route Frequency Ordered Stop   11/02/21 2000  ceFEPIme (MAXIPIME) 2 g in sodium chloride 0.9 % 100 mL IVPB  2 g 200 mL/hr over 30 Minutes Intravenous Every 24 hours 11/02/21 1316     11/02/21 1800  linezolid (ZYVOX) IVPB 600 mg        600 mg 300 mL/hr over 60 Minutes Intravenous Every 12 hours 11/02/21 1247     10/31/21 1400  vancomycin (VANCOCIN) IVPB 750 mg/150 ml premix        750 mg 150 mL/hr over 60 Minutes Intravenous Every Dialysis 10/31/21 0835 10/31/21 1840   10/30/21 2000   vancomycin (VANCOREADY) IVPB 1500 mg/300 mL  Status:  Discontinued        1,500 mg 150 mL/hr over 120 Minutes Intravenous Every 24 hours 10/29/21 1806 10/29/21 2007   10/30/21 2000  ceFEPIme (MAXIPIME) 1 g in sodium chloride 0.9 % 100 mL IVPB  Status:  Discontinued        1 g 200 mL/hr over 30 Minutes Intravenous Every 24 hours 10/29/21 2009 11/02/21 1316   10/30/21 1600  vancomycin (VANCOREADY) IVPB 750 mg/150 mL        750 mg 150 mL/hr over 60 Minutes Intravenous Every M-W-F (Hemodialysis) 10/30/21 1146 10/30/21 2006   10/30/21 1136  vancomycin variable dose per unstable renal function (pharmacist dosing)  Status:  Discontinued         Does not apply See admin instructions 10/30/21 1137 11/02/21 1247   10/30/21 0400  ceFEPIme (MAXIPIME) 2 g in sodium chloride 0.9 % 100 mL IVPB  Status:  Discontinued        2 g 200 mL/hr over 30 Minutes Intravenous Every 8 hours 10/29/21 1806 10/29/21 2007   10/29/21 2015  ceFEPIme (MAXIPIME) 1 g in sodium chloride 0.9 % 100 mL IVPB  Status:  Discontinued        1 g 200 mL/hr over 30 Minutes Intravenous Every 24 hours 10/29/21 2007 10/29/21 2009   10/29/21 1800  ceFEPIme (MAXIPIME) 2 g in sodium chloride 0.9 % 100 mL IVPB        2 g 200 mL/hr over 30 Minutes Intravenous  Once 10/29/21 1747 10/29/21 1852   10/29/21 1800  metroNIDAZOLE (FLAGYL) IVPB 500 mg        500 mg 100 mL/hr over 60 Minutes Intravenous  Once 10/29/21 1747 10/29/21 2004   10/29/21 1800  vancomycin (VANCOCIN) IVPB 1000 mg/200 mL premix        1,000 mg 200 mL/hr over 60 Minutes Intravenous  Once 10/29/21 1747 10/29/21 2149   10/29/21 1800  vancomycin (VANCOCIN) IVPB 1000 mg/200 mL premix        1,000 mg 200 mL/hr over 60 Minutes Intravenous  Once 10/29/21 1754 10/29/21 2100       Subjective: Candice Camp continue to have diarrhea and hypotension; requiring levophed. No CP, no nausea, no vomiting. Currently afebrile.  Objective: Vitals:   11/05/21 0645 11/05/21 0700 11/05/21  0715 11/05/21 0750  BP: (!) 57/25 94/63 103/66   Pulse:      Resp:  17 19 16   Temp: 99.3 F (37.4 C) 98.1 F (36.7 C) 99.1 F (37.3 C) 99.3 F (37.4 C)  TempSrc:    Bladder  SpO2:      Weight:      Height:        Intake/Output Summary (Last 24 hours) at 11/05/2021 1144 Last data filed at 11/05/2021 0900 Gross per 24 hour  Intake 1592.06 ml  Output 1210 ml  Net 382.06 ml   Filed Weights   11/02/21 0900 11/03/21 0443 11/04/21 0516  Weight: 87.1 kg  85.4 kg 89.3 kg    Physical Exam General exam: slow to respond, positive lethargy. No fever, no CP, no nausea or vomiting. Continue to have diarrhea. Poor oral intake reported.  Respiratory system: decreased BS at the bases, no wheezing. Positive rhonchi. Good sat on RA> Cardiovascular system: sustained sinus tachycardia appreciated today, no rubs, no gallops, no JVD.  Gastrointestinal system: Abdomen is nondistended, soft and nontender. No organomegaly or masses felt. Normal bowel sounds heard. Central nervous system: Alert and oriented. No focal neurological deficits. Extremities: No cyanosis, no clubbing; 1 + edema appreciated in her extremities.  Skin: No petechiae. Left IJ in place.  Psychiatry: Judgement and insight appear impaired with current encephalopathy.   Data Reviewed: I have personally reviewed following labs and imaging studies  CBC: Recent Labs  Lab 10/29/21 1700 10/29/21 2236 11/01/21 0432 11/02/21 0125 11/03/21 0430 11/04/21 0357 11/05/21 0935  WBC 17.8*   < > 19.2* 18.8* 15.4* 14.8* 21.9*  NEUTROABS 15.6*  --   --   --   --   --  17.6*  HGB 7.1*   < > 7.1* 8.2* 7.9* 7.2* 7.1*  HCT 21.0*   < > 21.0* 24.9* 23.7* 22.7* 22.1*  MCV 78.4*   < > 79.2* 81.6 82.0 85.0 84.4  PLT 239   < > 162 127* 99* 89* 85*   < > = values in this interval not displayed.   Basic Metabolic Panel: Recent Labs  Lab 10/29/21 2119 10/30/21 0423 10/31/21 0738 10/31/21 1328 11/01/21 0432 11/02/21 0125 11/03/21 0430  11/04/21 0357 11/05/21 0935  NA 126*   < >  --    < > 135 134* 134* 134* 136  K <2.0*   < >  --    < > 3.9 3.0* 3.3* 4.6 3.2*  CL 99   < >  --    < > 103 103 105 107 111  CO2 11*   < >  --    < > 22 21* 20* 20* 16*  GLUCOSE 181*   < >  --    < > 149* 138* 139* 125* 119*  BUN 188*   < >  --    < > 34* 32* 32* 30* 28*  CREATININE 10.60*   < >  --    < > 2.98* 3.22* 3.51* 3.40* 3.34*  CALCIUM 7.4*   < >  --    < > 7.6* 7.8* 8.1* 8.2* 7.8*  MG 2.3  --  1.7  --   --   --   --   --   --   PHOS  --    < >  --    < > 1.5* 3.7 3.2 3.0 2.2*   < > = values in this interval not displayed.   GFR: Estimated Creatinine Clearance: 21.1 mL/min (A) (by C-G formula based on SCr of 3.34 mg/dL (H)).  Liver Function Tests: Recent Labs  Lab 10/29/21 1700 10/30/21 0423 10/30/21 1340 10/31/21 1328 11/01/21 0432 11/03/21 0430 11/04/21 0357 11/05/21 0935  AST 24 19  --   --   --   --   --   --   ALT 14 10  --   --   --   --   --   --   ALKPHOS 104 86  --   --   --   --   --   --   BILITOT 0.9 0.8  --   --   --   --   --   --  PROT 6.4* 5.2*  --   --   --   --   --   --   ALBUMIN 2.3* 1.9*   < > 1.7* 1.7* 1.6* 1.9* 1.6*   < > = values in this interval not displayed.   Coagulation Profile: Recent Labs  Lab 10/29/21 1751  INR 1.4*   CBG: Recent Labs  Lab 11/04/21 0736 11/04/21 1129 11/04/21 1544 11/04/21 2202 11/05/21 0752  GLUCAP 101* 174* 95 92 106*   Urine analysis:    Component Value Date/Time   COLORURINE GREEN (A) 10/29/2021 2005   APPEARANCEUR CLOUDY (A) 10/29/2021 2005   LABSPEC 1.020 10/29/2021 2005   PHURINE 6.0 10/29/2021 2005   GLUCOSEU NEGATIVE 10/29/2021 2005   HGBUR SMALL (A) 10/29/2021 2005   BILIRUBINUR NEGATIVE 10/29/2021 2005   KETONESUR 20 (A) 10/29/2021 2005   PROTEINUR 100 (A) 10/29/2021 2005   NITRITE POSITIVE (A) 10/29/2021 2005   LEUKOCYTESUR LARGE (A) 10/29/2021 2005   Sepsis Labs:  Recent Results (from the past 240 hour(s))  Resp Panel by RT-PCR  (Flu A&B, Covid) Nasopharyngeal Swab     Status: None   Collection Time: 10/29/21  5:27 PM   Specimen: Nasopharyngeal Swab; Nasopharyngeal(NP) swabs in vial transport medium  Result Value Ref Range Status   SARS Coronavirus 2 by RT PCR NEGATIVE NEGATIVE Final    Comment: (NOTE) SARS-CoV-2 target nucleic acids are NOT DETECTED.  The SARS-CoV-2 RNA is generally detectable in upper respiratory specimens during the acute phase of infection. The lowest concentration of SARS-CoV-2 viral copies this assay can detect is 138 copies/mL. A negative result does not preclude SARS-Cov-2 infection and should not be used as the sole basis for treatment or other patient management decisions. A negative result may occur with  improper specimen collection/handling, submission of specimen other than nasopharyngeal swab, presence of viral mutation(s) within the areas targeted by this assay, and inadequate number of viral copies(<138 copies/mL). A negative result must be combined with clinical observations, patient history, and epidemiological information. The expected result is Negative.  Fact Sheet for Patients:  EntrepreneurPulse.com.au  Fact Sheet for Healthcare Providers:  IncredibleEmployment.be  This test is no t yet approved or cleared by the [place] FDA and  has been authorized for detection and/or diagnosis of SARS-CoV-2 by FDA under an Emergency Use Authorization (EUA). This EUA will remain  in effect (meaning this test can be used) for the duration of the COVID-19 declaration under Section 564(b)(1) of the Act, 21 U.S.C.section 360bbb-3(b)(1), unless the authorization is terminated  or revoked sooner.       Influenza A by PCR NEGATIVE NEGATIVE Final   Influenza B by PCR NEGATIVE NEGATIVE Final    Comment: (NOTE) The Xpert Xpress SARS-CoV-2/FLU/RSV plus assay is intended as an aid in the diagnosis of influenza from Nasopharyngeal swab specimens  and should not be used as a sole basis for treatment. Nasal washings and aspirates are unacceptable for Xpert Xpress SARS-CoV-2/FLU/RSV testing.  Fact Sheet for Patients: EntrepreneurPulse.com.au  Fact Sheet for Healthcare Providers: IncredibleEmployment.be  This test is not yet approved or cleared by the [place] FDA and has been authorized for detection and/or diagnosis of SARS-CoV-2 by FDA under an Emergency Use Authorization (EUA). This EUA will remain in effect (meaning this test can be used) for the duration of the COVID-19 declaration under Section 564(b)(1) of the Act, 21 U.S.C. section 360bbb-3(b)(1), unless the authorization is terminated or revoked.  Performed at [facility], [address] [place], [place]  [address]   Culture, blood (routine x 2)     Status: None   Collection Time: 10/29/21  5:51 PM   Specimen: BLOOD  Result Value Ref Range Status   Specimen Description BLOOD BLOOD RIGHT ARM  Final   Special Requests   Final    BOTTLES DRAWN AEROBIC AND ANAEROBIC Blood Culture adequate volume   Culture   Final    NO GROWTH 6 DAYS Performed at [facility], [address] [place], [place] [address]    Report Status 11/04/2021 FINAL  Final  Culture, blood (routine x 2)     Status: None   Collection Time: 10/29/21  9:19 PM   Specimen: BLOOD  Result Value Ref Range Status   Specimen Description BLOOD central line  Final   Special Requests   Final    BOTTLES DRAWN AEROBIC AND ANAEROBIC Blood Culture adequate volume   Culture   Final    NO GROWTH 6 DAYS Performed at [facility], [address] [place], [place] [address]    Report Status 11/04/2021 FINAL  Final  MRSA Next Gen by PCR, Nasal     Status: Abnormal   Collection Time: 10/30/21  1:52 AM   Specimen: Nasal Mucosa; Nasal Swab  Result Value Ref Range Status   MRSA by PCR Next Gen DETECTED (A) NOT DETECTED Final    Comment: RESULT CALLED TO, READ BACK BY AND  VERIFIED WITH: ASHLEY @ 1055 ON [id number] BY HENDERSON L (NOTE) The GeneXpert MRSA Assay (FDA approved for NASAL specimens only), is one component of a comprehensive MRSA colonization surveillance program. It is not intended to diagnose MRSA infection nor to guide or monitor treatment for MRSA infections. Test performance is not FDA approved in patients less than 55 years old. Performed at [facility], [address], [place], [place] [address]   Gastrointestinal Panel by PCR , Stool     Status: None   Collection Time: 10/30/21  5:35 PM   Specimen: Rectum; Stool  Result Value Ref Range Status   Campylobacter species NOT DETECTED NOT DETECTED Final   Plesimonas shigelloides NOT DETECTED NOT DETECTED Final   Salmonella species NOT DETECTED NOT DETECTED Final   Yersinia enterocolitica NOT DETECTED NOT DETECTED Final   Vibrio species NOT DETECTED NOT DETECTED Final   Vibrio cholerae NOT DETECTED NOT DETECTED Final   Enteroaggregative E coli (EAEC) NOT DETECTED NOT DETECTED Final   Enteropathogenic E coli (EPEC) NOT DETECTED NOT DETECTED Final   Enterotoxigenic E coli (ETEC) NOT DETECTED NOT DETECTED Final   Shiga like toxin producing E coli (STEC) NOT DETECTED NOT DETECTED Final   Shigella/Enteroinvasive E coli (EIEC) NOT DETECTED NOT DETECTED Final   Cryptosporidium NOT DETECTED NOT DETECTED Final   Cyclospora cayetanensis NOT DETECTED NOT DETECTED Final   Entamoeba histolytica NOT DETECTED NOT DETECTED Final   Giardia lamblia NOT DETECTED NOT DETECTED Final   Adenovirus F40/41 NOT DETECTED NOT DETECTED Final   Astrovirus NOT DETECTED NOT DETECTED Final   Norovirus GI/GII NOT DETECTED NOT DETECTED Final   Rotavirus A NOT DETECTED NOT DETECTED Final   Sapovirus (I, II, IV, and V) NOT DETECTED NOT DETECTED Final    Comment: Performed at [facility], [place] [place],  [address]  C Difficile Quick Screen w PCR reflex     Status: None   Collection Time: 10/30/21   5:35 PM   Specimen: Rectum; Stool  Result Value Ref Range Status   C Diff antigen NEGATIVE NEGATIVE  Final   C Diff toxin NEGATIVE NEGATIVE Final   C Diff interpretation No C. difficile detected.  Final    Comment: Performed at [facility], [address], [place], [place] [address]  Urine Culture     Status: None   Collection Time: 11/01/21  8:20 AM   Specimen: Urine, Catheterized  Result Value Ref Range Status   Specimen Description   Final    URINE, CATHETERIZED Performed at [facility], [address], [place], [place] [address]    Special Requests   Final    NONE Performed at [facility], [address], [place], [place] [address]    Culture   Final    NO GROWTH Performed at [facility], [place] [address] [place], [place] [address]    Report Status 11/02/2021 FINAL  Final    Radiology Studies: No results found.   Scheduled Meds:  sodium chloride   Intravenous Once   Chlorhexidine Gluconate Cloth  6 each Topical Daily   darbepoetin (ARANESP) injection - NON-DIALYSIS  40 mcg Subcutaneous Q Mon-1800   enoxaparin (LOVENOX) injection  30 mg Subcutaneous Q24H   feeding supplement (NEPRO CARB STEADY)  237 mL Oral TID PC   hydrocortisone sod succinate (SOLU-CORTEF) inj  100 mg Intravenous Q8H   insulin aspart  0-5 Units Subcutaneous QHS   insulin aspart  0-9 Units Subcutaneous TID WC   midodrine  10 mg Oral TID WC   mupirocin ointment   Nasal BID   sodium bicarbonate  650 mg Oral BID   Continuous Infusions:  sodium chloride Stopped (10/29/21 2251)   ceFEPime (MAXIPIME) IV Stopped (11/04/21 2137)   lactated ringers 75 mL/hr at 11/05/21 0323   linezolid (ZYVOX) IV 600 mg (11/05/21 1007)   norepinephrine (LEVOPHED) Adult infusion 5 mcg/min (11/05/21 0323)    LOS: 6 days   Barton Dubois M.D on 11/05/2021 at 11:44 AM  Go to [email or web address] - for contact info  [facility] - Office  [phone]  If 7PM-7AM, please contact  night-coverage [email or web address] Password [facility] 11/05/2021, 11:44 AM

## 2021-11-05 NOTE — Progress Notes (Signed)
Labs reviewed. Cr stable fortunately, Cr 3.3.  Worsening bicarb (corrected AG ~15), most likely related to diarrhea-increasing nahco3 to 1300mg  TID.  K 3.2-replete per primary service.  Gean Quint, MD [facility]

## 2021-11-05 NOTE — Progress Notes (Signed)
Palliative-   Consult received, chart reviewed.  Evaluated patient. No family at bedside.  Attempted to call listed contact- patient's aunt.  No answer- left message req return call.  Mariana Kaufman, AGNP-C Palliative Medicine  No charge

## 2021-11-05 NOTE — Progress Notes (Signed)
Admit: 10/29/2021 LOS: 6  68F AoCKD (BL SCr ~2), profoundly malnourished, shock  Subjective:  No acute events. On midodrine now. Overnight urine output not charted. ~450cc of UOP yesterday during the day. Tachycardic with HR in 130's, rec'd NS bolus 750cc with no significant change in HR. Labs ordered for AM. Still has high stool output via rectal tube, appetite is still poor. NE down to 85mcg, on midodrine 10mg  tid now. Discussed with ICU RN at bedside.  12/21 0701 - 12/22 0700 In: 2066.8 [I.V.:1366.8; IV Piggyback:700] Out: 1265 [Urine:465; Stool:800]  Filed Weights   11/02/21 0900 11/03/21 0443 11/04/21 0516  Weight: 87.1 kg 85.4 kg 89.3 kg    Scheduled Meds:  sodium chloride   Intravenous Once   Chlorhexidine Gluconate Cloth  6 each Topical Daily   Chlorhexidine Gluconate Cloth  6 each Topical Q0600   Chlorhexidine Gluconate Cloth  6 each Topical Q0600   darbepoetin (ARANESP) injection - NON-DIALYSIS  40 mcg Subcutaneous Q Mon-1800   enoxaparin (LOVENOX) injection  30 mg Subcutaneous Q24H   feeding supplement (NEPRO CARB STEADY)  237 mL Oral TID PC   insulin aspart  0-5 Units Subcutaneous QHS   insulin aspart  0-9 Units Subcutaneous TID WC   midodrine  10 mg Oral TID WC   mupirocin ointment   Nasal BID   sodium bicarbonate  650 mg Oral BID   Continuous Infusions:  sodium chloride Stopped (10/29/21 2251)   ceFEPime (MAXIPIME) IV Stopped (11/04/21 2137)   lactated ringers 75 mL/hr at 11/05/21 0323   linezolid (ZYVOX) IV Stopped (11/04/21 2323)   norepinephrine (LEVOPHED) Adult infusion 5 mcg/min (11/05/21 0323)   PRN Meds:.alteplase, loperamide, oxyCODONE, pentafluoroprop-tetrafluoroeth, prochlorperazine  Current Labs: reviewed   Latest Reference Range & Units 10/30/21 04:23  Saturation Ratios 10.4 - 31.8 % 71 (H)  Ferritin 11 - 307 ng/mL 1,080 (H)  (H): Data is abnormally high  Physical Exam:  Blood pressure 103/66, pulse (!) 129, temperature 99.3 F (37.4 C),  temperature source Bladder, resp. rate 16, height 5\' 4"  (1.626 m), weight 89.3 kg, last menstrual period 09/26/2015, SpO2 100 %. Gen: ill appearing, NAD HEENT: temporal wasting, MMM Cardiac: s1s2, tachycardic Resp: cta bl, on RA Abd: soft, nt/nd Ext: no sig edema b/l LE's Neuro: nonfocal, slow to respond, globally weak  Recent Labs  Lab 11/02/21 0125 11/03/21 0430 11/04/21 0357  NA 134* 134* 134*  K 3.0* 3.3* 4.6  CL 103 105 107  CO2 21* 20* 20*  GLUCOSE 138* 139* 125*  BUN 32* 32* 30*  CREATININE 3.22* 3.51* 3.40*  CALCIUM 7.8* 8.1* 8.2*  PHOS 3.7 3.2 3.0   Recent Labs  Lab 10/29/21 1700 10/29/21 2236 11/02/21 0125 11/03/21 0430 11/04/21 0357  WBC 17.8*   < > 18.8* 15.4* 14.8*  NEUTROABS 15.6*  --   --   --   --   HGB 7.1*   < > 8.2* 7.9* 7.2*  HCT 21.0*   < > 24.9* 23.7* 22.7*  MCV 78.4*   < > 81.6 82.0 85.0  PLT 239   < > 127* 99* 89*   < > = values in this interval not displayed.    Assessment AoCKD3 (BL SCr ~2) in context of #3 and #4; Req HD 12/16 and 12/17.  Seems to have recovery of GFR with hydration and supportive care but SCr now stalled above historical baseline, time will tell where her kidney function settles out to. R IJ HD temp cath removed 12/20.  Nonoliguric.  Solitary L kidney, hx/o R nephrectomy; no [place] since 08/2021 Shock on NE, Bcx+Ucx NGTD; Zyvox/Cefepime. On NE, midodrine, and isotonic fluids Malnourished/protein calorie malnutrition with hypokalemia, hypophosphatemia Anemia, on ESA. FOBT neg on admit. Transfuse PRN for hgb <7 Metabolic acidosis 2/2 #1: on nahco3 PO Acute metabolic encephalopathy Diarrhea DM2- per primary Thrombocytopenia-per primary Hyponatremia, likely hypovolemic: improved  Plan Labs ordered for today. No indication for renal replacement therapy today based on exam. If this is needed down the road, then would prefer tunneled dialysis catheter to be placed Tachycardic w/ HR up to 130s--Bolus rest of LR bag, s/p ~750cc  bolus of NS earlier. Labs ordered C/w supportive care, would c/w LR especially as appetite still seems to be low and still has high stool output (overall euvolemic on exam). Pressor support per primary service, now on midodrine Strict I/O Medication Issues; Preferred narcotic agents for pain control are hydromorphone, fentanyl, and methadone. Morphine should not be used.  Baclofen should be avoided Avoid oral sodium phosphate and magnesium citrate based laxatives / bowel preps   Gean Quint, MD [place]

## 2021-11-06 LAB — BASIC METABOLIC PANEL
Anion gap: 9 (ref 5–15)
BUN: 27 mg/dL — ABNORMAL HIGH (ref 6–20)
CO2: 17 mmol/L — ABNORMAL LOW (ref 22–32)
Calcium: 7.8 mg/dL — ABNORMAL LOW (ref 8.9–10.3)
Chloride: 109 mmol/L (ref 98–111)
Creatinine, Ser: 3.41 mg/dL — ABNORMAL HIGH (ref 0.44–1.00)
GFR, Estimated: 15 mL/min — ABNORMAL LOW (ref >60)
Glucose, Bld: 169 mg/dL — ABNORMAL HIGH (ref 70–99)
Potassium: 2.9 mmol/L — ABNORMAL LOW (ref 3.5–5.1)
Sodium: 135 mmol/L (ref 135–145)

## 2021-11-06 LAB — GLUCOSE, CAPILLARY
Glucose-Capillary: 153 mg/dL — ABNORMAL HIGH (ref 70–99)
Glucose-Capillary: 149 mg/dL — ABNORMAL HIGH (ref 70–99)
Glucose-Capillary: 183 mg/dL — ABNORMAL HIGH (ref 70–99)
Glucose-Capillary: 183 mg/dL — ABNORMAL HIGH (ref 70–99)

## 2021-11-06 LAB — DIC (DISSEMINATED INTRAVASCULAR COAGULATION)PANEL
D-Dimer, Quant: 0.38 ug/mL-FEU (ref 0.00–0.50)
Fibrinogen: 148 mg/dL — ABNORMAL LOW (ref 210–475)
INR: 1.8 — ABNORMAL HIGH (ref 0.8–1.2)
Platelets: 107 10*3/uL — ABNORMAL LOW (ref 150–400)
Prothrombin Time: 20.4 seconds — ABNORMAL HIGH (ref 11.4–15.2)
Smear Review: NONE SEEN
aPTT: 44 seconds — ABNORMAL HIGH (ref 24–36)

## 2021-11-06 LAB — CBC
HCT: 22.3 % — ABNORMAL LOW (ref 36.0–46.0)
Hemoglobin: 7.3 g/dL — ABNORMAL LOW (ref 12.0–15.0)
MCH: 27.4 pg (ref 26.0–34.0)
MCHC: 32.7 g/dL (ref 30.0–36.0)
MCV: 83.8 fL (ref 80.0–100.0)
Platelets: 90 10*3/uL — ABNORMAL LOW (ref 150–400)
RBC: 2.66 MIL/uL — ABNORMAL LOW (ref 3.87–5.11)
RDW: 18.5 % — ABNORMAL HIGH (ref 11.5–15.5)
WBC: 19.5 10*3/uL — ABNORMAL HIGH (ref 4.0–10.5)
nRBC: 0 % (ref 0.0–0.2)

## 2021-11-06 LAB — LACTATE DEHYDROGENASE: LDH: 217 U/L — ABNORMAL HIGH (ref 98–192)

## 2021-11-06 MED ORDER — ZINC OXIDE 40 % EX PSTE
PASTE | Freq: Four times a day (QID) | CUTANEOUS | Status: DC
  Administered 2021-11-06: 1 via TOPICAL
  Filled 2021-11-06 (×2): qty 57

## 2021-11-06 MED ORDER — PNEUMOCOCCAL VAC POLYVALENT 25 MCG/0.5ML IJ INJ
0.5000 mL | INJECTION | INTRAMUSCULAR | Status: AC
  Administered 2021-11-07: 10:00:00 0.5 mL via INTRAMUSCULAR
  Filled 2021-11-06: qty 0.5

## 2021-11-06 MED ORDER — SACCHAROMYCES BOULARDII 250 MG PO CAPS
250.0000 mg | ORAL_CAPSULE | Freq: Two times a day (BID) | ORAL | Status: DC
  Administered 2021-11-06 – 2021-11-07 (×3): 250 mg via ORAL
  Filled 2021-11-06 (×3): qty 1

## 2021-11-06 MED ORDER — INFLUENZA VAC SPLIT QUAD 0.5 ML IM SUSY
0.5000 mL | PREFILLED_SYRINGE | INTRAMUSCULAR | Status: AC
  Administered 2021-11-07: 10:00:00 0.5 mL via INTRAMUSCULAR
  Filled 2021-11-06: qty 0.5

## 2021-11-06 MED ORDER — POTASSIUM CHLORIDE 10 MEQ/100ML IV SOLN
10.0000 meq | INTRAVENOUS | Status: AC
  Administered 2021-11-06 (×2): 10 meq via INTRAVENOUS
  Filled 2021-11-06 (×2): qty 100

## 2021-11-06 NOTE — Care Management Important Message (Signed)
Important Message  Patient Details  Name: Courtney Dyer MRN: [id number] Date of Birth: [birth year]   Medicare Important Message Given:  Yes     Tommy Medal 11/06/2021, 12:57 PM

## 2021-11-06 NOTE — Progress Notes (Signed)
Patient remains lethargic, able to wake up briefly and ate some ice cream with her meds, she immediately falls asleep and becomes difficult to arouse again. Patients mentation difficult to assess as she is not answering or responding to any questions, nor is she giving a head nod or using any gestures. MD aware. No new orders at this time.

## 2021-11-06 NOTE — Progress Notes (Signed)
Pharmacy Antibiotic Note  Courtney Dyer is a 55 y.o. female admitted on 10/29/2021 with sepsis.  Pharmacy has been consulted for cefepime dosing.  Pt presented with anemia and unknown sepsis(suspect related to UTI and PNA). PCT 0.66.Marland Kitchen AoCKD3 (BL SCr ~2)  Req HD 12/16 and 12/17.  Seems to have recovery of GFR with hydration and supportive care but SCr now stalled at 3.41. Plan is for zyvox and cefepime for 10 days. Anemia, on ESA. Plt stable at 90K.  D#9/10  Plan: Continue Cefepime 2g IV q24h Continue zyvox 600mg  IV q12h F/U cxs and clinical progress Monitor V/S, labs  Height: 5\' 4"  (162.6 cm) Weight: 89.3 kg (196 lb 13.9 oz) IBW/kg (Calculated) : 54.7  Temp (24hrs), Avg:97.9 F (36.6 C), Min:97.2 F (36.2 C), Max:99 F (37.2 C)  Recent Labs  Lab 11/02/21 0125 11/03/21 0430 11/04/21 0357 11/05/21 0935 11/06/21 0431  WBC 18.8* 15.4* 14.8* 21.9* 19.5*  CREATININE 3.22* 3.51* 3.40* 3.34* 3.41*     Estimated Creatinine Clearance: 20.6 mL/min (A) (by C-G formula based on SCr of 3.41 mg/dL (H)).    No Known Allergies  Antimicrobials this admission: Zyvox 12/19 >> 12/15 vanc>>12/18 12/15 cefepime>> 12/15 flagyl   Microbiology results: 12/15 blood>> ngtd 12/16 MRSA PCR:  positive Cdiff: neg 12/18 Ucx: ng  Isac Sarna, BS [place], [place] Clinical Pharmacist Pager [phone] 11/06/2021 8:32 AM

## 2021-11-06 NOTE — Consult Note (Signed)
WOC Nurse Consult Note: Patient receiving care in [facility] ICU 7. In completing this order I spoke with primary RN, Shane--the nurse responsible for putting the consult order in--via telephone. Reason for Consult: MASD to multiple areas Wound type: MASD-IAD.  [name] reports the patient has had diarrhea since admission, and even with using a fecal containment system, there is heavy diarrheal leakage from around the tube. As a result, when cleaning the patient this morning, the "top layer of skin came off" leaving open, pink, "raw" tissue exposed to the areas impacted by the diarrhea.  Currently those areas are the posterior upper thighs, labia/perineal area, and lower buttocks. Pressure Injury POA: Yes/No/NA Measurement: Wound bed: Drainage (amount, consistency, odor)  Periwound: Dressing procedure/placement/frequency: Apply Triple Paste to areas impacted by incontinence/fecal leakage--currently posterior upper thighs, perineal area, buttocks. NOTE TO PHARMACY: can substitute 20% zinc ointment if Triple Paste is unavailable.  This is to be provided 4 times daily.  Monitor the wound area(s) for worsening of condition such as: Signs/symptoms of infection,  Increase in size,  Development of or worsening of odor, Development of pain, or increased pain at the affected locations.  Notify the medical team if any of these develop.  Thank you for the consult.  Discussed plan of care with the bedside nurse.  [place] nurse will not follow at this time.  Please re-consult the [facility] team if needed.  Val Riles, RN, MSN, CWOCN, CNS-BC, pager [phone]

## 2021-11-06 NOTE — Progress Notes (Signed)
Patient ID: Courtney Dyer, female   DOB: Feb 19, 1966, 55 y.o.   MRN: [id number] S: No acute events overnight.  Pt remains lethargic and having diarrhea. O:BP 100/64    Pulse (!) 102    Temp 98.1 F (36.7 C)    Resp (!) 22    Ht 5\' 4"  (1.626 m)    Wt 89.3 kg    LMP 09/26/2015    SpO2 100%    BMI 33.79 kg/m   Intake/Output Summary (Last 24 hours) at 11/06/2021 1014 Last data filed at 11/06/2021 2706 Gross per 24 hour  Intake 4053.15 ml  Output 1100 ml  Net 2953.15 ml   Intake/Output: I/O last 3 completed shifts: In: 3394.5 [I.V.:2494.5; Other:100; IV Piggyback:800] Out: 115 [Urine:115]  Intake/Output this shift:  Total I/O In: 1247.3 [P.O.:200; I.V.:703.3; IV Piggyback:344] Out: 1100 [Urine:100; Stool:1000] Weight change:  CBJ:SEGBTDVVO and nonverbal HYW:VPXTG at 102 Resp:CTA Abd: +BS, soft, NT/ND  Ext:no edema  Recent Labs  Lab 10/30/21 2122 10/31/21 0528 10/31/21 1328 11/01/21 0432 11/02/21 0125 11/03/21 0430 11/04/21 0357 11/05/21 0935 11/06/21 0431  NA 130* 132* 129* 135 134* 134* 134* 136 135  K 2.7* 2.8* 3.0* 3.9 3.0* 3.3* 4.6 3.2* 2.9*  CL 100 103 104 103 103 105 107 111 109  CO2 16* 17* 17* 22 21* 20* 20* 16* 17*  GLUCOSE 159* 157* 131* 149* 138* 139* 125* 119* 169*  BUN 85* 80* 73* 34* 32* 32* 30* 28* 27*  CREATININE 5.60* 5.31* 5.52* 2.98* 3.22* 3.51* 3.40* 3.34* 3.41*  ALBUMIN 1.8* 1.7* 1.7* 1.7*  --  1.6* 1.9* 1.6*  --   CALCIUM 7.1* 7.7* 7.5* 7.6* 7.8* 8.1* 8.2* 7.8* 7.8*  PHOS 1.3* 1.2* 1.4* 1.5* 3.7 3.2 3.0 2.2*  --    Liver Function Tests: Recent Labs  Lab 11/03/21 0430 11/04/21 0357 11/05/21 0935  ALBUMIN 1.6* 1.9* 1.6*   No results for input(s): LIPASE, AMYLASE in the last 168 hours. No results for input(s): AMMONIA in the last 168 hours. CBC: Recent Labs  Lab 11/02/21 0125 11/03/21 0430 11/04/21 0357 11/05/21 0935 11/06/21 0431  WBC 18.8* 15.4* 14.8* 21.9* 19.5*  NEUTROABS  --   --   --  17.6*  --   HGB 8.2* 7.9* 7.2* 7.1* 7.3*   HCT 24.9* 23.7* 22.7* 22.1* 22.3*  MCV 81.6 82.0 85.0 84.4 83.8  PLT 127* 99* 89* 85* 90*   Cardiac Enzymes: No results for input(s): CKTOTAL, CKMB, CKMBINDEX, TROPONINI in the last 168 hours. CBG: Recent Labs  Lab 11/05/21 0752 11/05/21 1157 11/05/21 1632 11/05/21 2044 11/06/21 0739  GLUCAP 106* 155* 126* 134* 153*    Iron Studies: No results for input(s): IRON, TIBC, TRANSFERRIN, FERRITIN in the last 72 hours. Studies/Results: CT HEAD WO CONTRAST (5MM)  Result Date: 11/05/2021 CLINICAL DATA:  Mental status changes, combative EXAM: CT HEAD WITHOUT CONTRAST TECHNIQUE: Contiguous axial images were obtained from the base of the skull through the vertex without intravenous contrast. COMPARISON:  02/13/2021 FINDINGS: Brain: Limited with some motion artifact. No acute intracranial hemorrhage, mass lesion, new infarction, midline shift, herniation, or extra-axial fluid collection. No focal mass effect or edema. Cisterns are patent. No cerebellar abnormality. Vascular: No hyperdense vessel or unexpected calcification. Skull: Similar abnormal thickened appearance of the skull involving the skull base and maxilla with ground-glass density suggesting fibrous dysplasia. Sinuses/Orbits: No acute finding. Other: None. IMPRESSION: No acute intracranial abnormality. 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 Electronically Signed  By: Eugenie Filler M.D.   On: 11/05/2021 13:43    sodium chloride   Intravenous Once   Chlorhexidine Gluconate Cloth  6 each Topical Daily   darbepoetin (ARANESP) injection - NON-DIALYSIS  40 mcg Subcutaneous Q Mon-1800   enoxaparin (LOVENOX) injection  30 mg Subcutaneous Q24H   feeding supplement (NEPRO CARB STEADY)  237 mL Oral TID PC   hydrocortisone sod succinate (SOLU-CORTEF) inj  100 mg Intravenous Q8H   influenza vac split quadrivalent PF  0.5 mL Intramuscular Tomorrow-1000   insulin aspart  0-5 Units Subcutaneous QHS    insulin aspart  0-9 Units Subcutaneous TID WC   midodrine  10 mg Oral TID WC   mupirocin ointment   Nasal BID   pneumococcal 23 valent vaccine  0.5 mL Intramuscular Tomorrow-1000   saccharomyces boulardii  250 mg Oral BID   sodium bicarbonate  1,300 mg Oral TID   Zinc Oxide   Topical QID    BMET    Component Value Date/Time   NA 135 11/06/2021 0431   K 2.9 (L) 11/06/2021 0431   CL 109 11/06/2021 0431   CO2 17 (L) 11/06/2021 0431   GLUCOSE 169 (H) 11/06/2021 0431   BUN 27 (H) 11/06/2021 0431   CREATININE 3.41 (H) 11/06/2021 0431   CALCIUM 7.8 (L) 11/06/2021 0431   GFRNONAA 15 (L) 11/06/2021 0431   GFRAA >60 11/26/2015 2020   CBC    Component Value Date/Time   WBC 19.5 (H) 11/06/2021 0431   RBC 2.66 (L) 11/06/2021 0431   HGB 7.3 (L) 11/06/2021 0431   HCT 22.3 (L) 11/06/2021 0431   PLT 90 (L) 11/06/2021 0431   MCV 83.8 11/06/2021 0431   MCH 27.4 11/06/2021 0431   MCHC 32.7 11/06/2021 0431   RDW 18.5 (H) 11/06/2021 0431   LYMPHSABS 2.3 11/05/2021 0935   MONOABS 1.1 (H) 11/05/2021 0935   EOSABS 0.4 11/05/2021 0935   BASOSABS 0.1 11/05/2021 0935     Assessment/Plan:  AKI/CKD stage IIIb - presumably ischemic ATN in setting of sepsis and volume depletion with solitary functioning kidney.  S/p HD on 12/16 and 12/17 with some renal recovery, however Scr has reached a plateau of 3.4 and now UOP dropping.  She remains hypotensive.   If her renal function were to worsen would recommend transferring to [facility] for CVVHD if family wishes to be aggressive, Palliative care consulted and awaiting family meeting.   For now will increase LR to 100 ml/hr and start IV albumin and follow uop and daily Scr. No urgent indication for dialysis at this time Avoid nephrotoxic agents such as IV contrast, NSAIDs, phosphate containing bowel preps Maintain adequate hydration and BP support Shock/sepsis due to UTI although all cultures negative to date.   Currently on midodrine but concerned about  safety of po meds given AMS Recommend ECHO to evaluate cardiac function. Resume levophed if needed. Hypokalemia - due to GI losses.  Receiving IV KCl replacement today and follow.  Nonanion gap metabolic acidosis - presumably due to GI losses with diarrhea.  Bicarb increased yesterday and started on lactated ringers. Anemia of CKD stage III and acute illness - started on Aranesp, transfuse prn Thrombocytopenia - possible drug related to abx.  Consider hematology evaluation for possible HUS or TTP.  Will order DIC panel as well as LDH and ADAMTS13 activity. Diarrhea - likely due to antibiotics as all stool cultures negative Severe protein malnutrition - not able to eat on her own due to AMS, consider tube  feeds Acute metabolic encephalopathy - pt remains lethargic and nonverbal.  CT scan of brain without acute abnormality. Disposition - guarded prognosis.  Await palliative care input.    Donetta Potts, MD [employer] [phone]

## 2021-11-06 NOTE — Progress Notes (Signed)
Patient still very lethargic, only responding to sternal rub at this time. Will not reliably follow commands. Pupils are round, equal, and reactive. Vitals are stable at this time. MD aware.

## 2021-11-06 NOTE — Progress Notes (Signed)
PROGRESS NOTE     Courtney Dyer, is a 55 y.o. female, DOB - [birth year], JGG:[id number]  Admit date - 10/29/2021   Admitting Physician Bernadette Hoit, DO  Outpatient Primary MD for the patient is Pcp, No  LOS - 7  No chief complaint on file.     Altered mentation  Brief Narrative:  55 y.o. female with medical history significant for hyperlipidemia, T2DM, gout, GERD , HTN, CKD IV admitted on 10/30/2021 with AKI on CKD stage IV, severe electrolyte derangement, and persistent  severe dehydration partly due to persistent nausea and vomiting with persistent hypotension requiring IV fluids on IV Levophed for pressure support in the setting of sepsis and dehydration  Assessment & Plan:   Principal Problem:   Sepsis secondary to UTI [facility]) Active Problems:   Acute kidney injury ([place])   Hypokalemia   Symptomatic anemia   Hyponatremia   High anion gap metabolic acidosis   Dehydration   Leukocytosis   Microcytic anemia   Hypoalbuminemia due to protein-calorie malnutrition (HCC)   Acute metabolic encephalopathy   Mixed hyperlipidemia   Type 2 diabetes mellitus (HCC)   Gout   Prolonged QT interval   Obesity, Class III, BMI 40-49.9 (morbid obesity) ([place])   1)Persistent Hypotension/hemodynamic instability--- in the setting of dehydration and presumed sepsis -continues to require IV Levophed for pressure support (levophed at 5 mg/hr) -Continues to have low BP and is requiring pressors. -Latest concerns for UTI and pneumonia. -Continue Zyvox and cefepime. Day 9/10 -Continue 3 times daily midodrine; limited with current AMS and ability to take PO's safely at times. -cortisol found to be low and patient started on solucortef. -Currently off Levophed and albuterol with improvement in her BP. -Long goals of care discussion at bedside; family had decided patient to be DNR/DNI with further reevaluation in her condition in the next 48 hours. -Overall patient is not a candidate for  long-term hemodialysis (as discussed with Dr. Candiss Norse nephrology). -Blood, urine and GI cultures remain negative to date -Continue adjusted IV fluid rate and albumin infusion as per nephrology recommendations today. -patient is starting to demonstrate 3rd spacing.  2)AKi on CKD IV with anion gap metabolic acidosis--- suspect ATN --patient with significant dehydration due to persistent nausea and vomiting, also suspect kidney injury secondary to persistent hypotension -Solitary L kidney, hx/o R nephrectomy;  - CT a/p with dysplastic right kidney and no stones or hydronephrosis on the left.   creatinine was around 10, BUN around 160 -Scr remains around 3-3.4 currently. -Urine output is dropping; per nephrology if renal condition further deteriorates she may require to be transferred to [facility] for CVVH in case family would like to continue pursuing aggressive intervention. -Continue IV fluids and IV albumin; follow further recommendations by nephrology service. -Right neck temporary HD catheter removed on 11/03/2021 per nephrology; if further dialysis required will need to be done through tunneled catheter and CVVH at this point. -Continue avoiding nephrotoxic agents.  3) acute on chronic anemia--- no evidence of ongoing bleeding suspect some component of chronic anemia of CKD -Serum iron is 81 iron saturation 71, ferritin is not low,  -B12 and folate are stable. -Transition threshold/ball is for Hgb > 7 -Patient has received a total of 2 units of PRBC during this admission -ESA treatment as per nephrology service.  4)Chronic breast wound--- does not appear infected, Healing left breast periareolar wound--please see ultrasound report from 10/27/2021. -Continue wound care.  5)Hypokalemia/hyponatremia/hypophosphatemia--electrolyte derangement due to persistent diarrhea and poor oral intake. -Continue  to follow electrolytes and further replete as needed.  6)DM2- A1C 5.2 reflecting  excellent diabetic control PTA -Continue the use of Novolog/Humalog Sliding scale insulin with Accu-Cheks/Fingersticks as ordered   7)Social/Ethics--patient's aunt [name] is primary contact and decision maker [phone]) -NOK updated at bedside -Goals of care discussion decision has been made for DNR/DNI -Continue treatment otherwise reassess in the next 24-48 hours. -Overall prognosis is guarded -Palliative care has been consulted.  8)Sepsis with Septic Shock--- suspect related to UTI and pneumonia -PCT 0.66 -WBC 24.1>>19.2>>18.8>>15.4 -Culture data and antibiotics as above #1 -Currently off Levophed.  9) Acute metabolic encephalopathy secondary to sepsis with septic shock and persistent hypotension, dehydration, worsening renal function and electrolyte and metabolic derangement as mentioned above -Improved with hemodialysis; patient is still no eating/drinking -mentation is not back to baseline yet. -CT scan demonstrating no acute intracranial normalities; given prolonged episodes of hypotension there is concern for hypoxic injury.  10)Diarrhea-- C diff and GI pathogen/culture is Negative -Rectal tube placed continue IV fluids, continue to replace electrolytes as needed and provide supportive care -diarrhea continue. -Okay to use Imodium and Lomotil as needed -Florastor twice a day has been added.   CRITICAL CARE Performed by: Barton Dubois   Total critical care time: 50 minutes  Critical care time was exclusive of separately billable procedures and treating other patients.  Critical care was necessary to treat or prevent imminent or life-threatening deterioration.  Critical care was time spent personally by me on the following activities: development of treatment plan with patient and/or surrogate as well as nursing, discussions with consultants, evaluation of patient's response to treatment, examination of patient, obtaining history from patient or surrogate,  ordering and performing treatments and interventions, ordering and review of laboratory studies, ordering and review of radiographic studies, pulse oximetry and re-evaluation of patient's condition.  Disposition/Need for in-Hospital Stay- patient unable to be discharged at this time due to -Persistent hypotension requiring IV Levophed for pressure support -Ongoing acute on chronic renal failure. -Patient will return to [facility] SNF once medically improved  Status is: Inpatient  Remains inpatient appropriate because: As above  Disposition: The patient is from: Long-term SNF resident              Anticipated d/c is to: SNF              Anticipated d/c date is: > 3 days              Patient currently is not medically stable to d/c.   Barriers: Not Clinically Stable-   Code Status :  -  Code Status: Full Code   Family Communication:   patient's aunt [name] is primary contact and decision maker [phone]).  Consults  : Nephrology/general surgery/palliative care -case discussed with Dr. Candiss Norse (nephrology) on 11/05/21. -chart reviewed for recommendations by Dr. Arty Baumgartner (nephrology service 11/06/21)  DVT Prophylaxis  :   - SCDs  enoxaparin (LOVENOX) injection 30 mg Start: 10/30/21 1000 SCDs Start: 10/30/21 0152  Lab Results  Component Value Date   PLT 90 (L) 11/06/2021    Inpatient Medications  Scheduled Meds:  sodium chloride   Intravenous Once   Chlorhexidine Gluconate Cloth  6 each Topical Daily   darbepoetin (ARANESP) injection - NON-DIALYSIS  40 mcg Subcutaneous Q Mon-1800   enoxaparin (LOVENOX) injection  30 mg Subcutaneous Q24H   feeding supplement (NEPRO CARB STEADY)  237 mL Oral TID PC   hydrocortisone sod succinate (SOLU-CORTEF) inj  100 mg Intravenous Q8H  influenza vac split quadrivalent PF  0.5 mL Intramuscular Tomorrow-1000   insulin aspart  0-5 Units Subcutaneous QHS   insulin aspart  0-9 Units Subcutaneous TID WC   midodrine  10 mg Oral TID WC    mupirocin ointment   Nasal BID   pneumococcal 23 valent vaccine  0.5 mL Intramuscular Tomorrow-1000   saccharomyces boulardii  250 mg Oral BID   sodium bicarbonate  1,300 mg Oral TID   Zinc Oxide   Topical QID   Continuous Infusions:  sodium chloride Stopped (10/29/21 2251)   ceFEPime (MAXIPIME) IV Stopped (11/05/21 2206)   lactated ringers 75 mL/hr at 11/05/21 2258   linezolid (ZYVOX) IV 600 mg (11/06/21 0027)   norepinephrine (LEVOPHED) Adult infusion Stopped (11/05/21 1657)   potassium chloride     PRN Meds:.alteplase, loperamide, oxyCODONE, pentafluoroprop-tetrafluoroeth, prochlorperazine   Anti-infectives (From admission, onward)    Start     Dose/Rate Route Frequency Ordered Stop   11/02/21 2000  ceFEPIme (MAXIPIME) 2 g in sodium chloride 0.9 % 100 mL IVPB        2 g 200 mL/hr over 30 Minutes Intravenous Every 24 hours 11/02/21 1316     11/02/21 1800  linezolid (ZYVOX) IVPB 600 mg        600 mg 300 mL/hr over 60 Minutes Intravenous Every 12 hours 11/02/21 1247     10/31/21 1400  vancomycin (VANCOCIN) IVPB 750 mg/150 ml premix        750 mg 150 mL/hr over 60 Minutes Intravenous Every Dialysis 10/31/21 0835 10/31/21 1840   10/30/21 2000  vancomycin (VANCOREADY) IVPB 1500 mg/300 mL  Status:  Discontinued        1,500 mg 150 mL/hr over 120 Minutes Intravenous Every 24 hours 10/29/21 1806 10/29/21 2007   10/30/21 2000  ceFEPIme (MAXIPIME) 1 g in sodium chloride 0.9 % 100 mL IVPB  Status:  Discontinued        1 g 200 mL/hr over 30 Minutes Intravenous Every 24 hours 10/29/21 2009 11/02/21 1316   10/30/21 1600  vancomycin (VANCOREADY) IVPB 750 mg/150 mL        750 mg 150 mL/hr over 60 Minutes Intravenous Every M-W-F (Hemodialysis) 10/30/21 1146 10/30/21 2006   10/30/21 1136  vancomycin variable dose per unstable renal function (pharmacist dosing)  Status:  Discontinued         Does not apply See admin instructions 10/30/21 1137 11/02/21 1247   10/30/21 0400  ceFEPIme  (MAXIPIME) 2 g in sodium chloride 0.9 % 100 mL IVPB  Status:  Discontinued        2 g 200 mL/hr over 30 Minutes Intravenous Every 8 hours 10/29/21 1806 10/29/21 2007   10/29/21 2015  ceFEPIme (MAXIPIME) 1 g in sodium chloride 0.9 % 100 mL IVPB  Status:  Discontinued        1 g 200 mL/hr over 30 Minutes Intravenous Every 24 hours 10/29/21 2007 10/29/21 2009   10/29/21 1800  ceFEPIme (MAXIPIME) 2 g in sodium chloride 0.9 % 100 mL IVPB        2 g 200 mL/hr over 30 Minutes Intravenous  Once 10/29/21 1747 10/29/21 1852   10/29/21 1800  metroNIDAZOLE (FLAGYL) IVPB 500 mg        500 mg 100 mL/hr over 60 Minutes Intravenous  Once 10/29/21 1747 10/29/21 2004   10/29/21 1800  vancomycin (VANCOCIN) IVPB 1000 mg/200 mL premix        1,000 mg 200 mL/hr over 60 Minutes Intravenous  Once  10/29/21 1747 10/29/21 2149   10/29/21 1800  vancomycin (VANCOCIN) IVPB 1000 mg/200 mL premix        1,000 mg 200 mL/hr over 60 Minutes Intravenous  Once 10/29/21 1754 10/29/21 2100       Subjective: Candice Camp is Afebrile, no chest pain, no nausea, no vomiting.  Decrease in urine output appreciated.  Still no eating or drinking much.  Remains confused and having difficulties following commands.  Patient is off Levophed and able to move her limbs spontaneously.  Objective: Vitals:   11/06/21 0100 11/06/21 0400 11/06/21 0500 11/06/21 0600  BP: (!) 92/54 (!) 87/56 (!) 98/57 (!) 84/53  Pulse: 87 86 81 75  Resp: 17 17 17 18   Temp: (!) 97.5 F (36.4 C) 97.7 F (36.5 C) 97.7 F (36.5 C) 97.9 F (36.6 C)  TempSrc:      SpO2: 100% 100% 100% 100%  Weight:      Height:        Intake/Output Summary (Last 24 hours) at 11/06/2021 0842 Last data filed at 11/05/2021 2258 Gross per 24 hour  Intake 2805.86 ml  Output 115 ml  Net 2690.86 ml   Filed Weights   11/02/21 0900 11/03/21 0443 11/04/21 0516  Weight: 87.1 kg 85.4 kg 89.3 kg    Physical Exam General exam: Somnolent/lethargic; not following commands.   No eating or drinking. Having some difficulty taking oral medication Respiratory system: Clear to auscultation. Respiratory effort normal.  No requiring oxygen supplementation.  No using accessory muscles. Cardiovascular system:RRR. No murmurs, rubs, gallops.  Left IJ in place. Gastrointestinal system: Abdomen is nondistended, soft and nontender. No organomegaly or masses felt. Normal bowel sounds heard. Central nervous system: Awake; somnolent/lethargic having difficulty following commands. Extremities: No cyanosis clubbing; 1+ edema appreciated in her upper/lower limbs bilaterally. Skin: No petechiae. Psychiatry: Judgment and insight appear to be impaired due to active encephalopathy.  Data Reviewed: I have personally reviewed following labs and imaging studies  CBC: Recent Labs  Lab 11/02/21 0125 11/03/21 0430 11/04/21 0357 11/05/21 0935 11/06/21 0431  WBC 18.8* 15.4* 14.8* 21.9* 19.5*  NEUTROABS  --   --   --  17.6*  --   HGB 8.2* 7.9* 7.2* 7.1* 7.3*  HCT 24.9* 23.7* 22.7* 22.1* 22.3*  MCV 81.6 82.0 85.0 84.4 83.8  PLT 127* 99* 89* 85* 90*   Basic Metabolic Panel: Recent Labs  Lab 10/31/21 0738 10/31/21 1328 11/01/21 0432 11/02/21 0125 11/03/21 0430 11/04/21 0357 11/05/21 0935 11/06/21 0431  NA  --    < > 135 134* 134* 134* 136 135  K  --    < > 3.9 3.0* 3.3* 4.6 3.2* 2.9*  CL  --    < > 103 103 105 107 111 109  CO2  --    < > 22 21* 20* 20* 16* 17*  GLUCOSE  --    < > 149* 138* 139* 125* 119* 169*  BUN  --    < > 34* 32* 32* 30* 28* 27*  CREATININE  --    < > 2.98* 3.22* 3.51* 3.40* 3.34* 3.41*  CALCIUM  --    < > 7.6* 7.8* 8.1* 8.2* 7.8* 7.8*  MG 1.7  --   --   --   --   --   --   --   PHOS  --    < > 1.5* 3.7 3.2 3.0 2.2*  --    < > = values in this interval not displayed.  GFR: Estimated Creatinine Clearance: 20.6 mL/min (A) (by C-G formula based on SCr of 3.41 mg/dL (H)).  Liver Function Tests: Recent Labs  Lab 10/31/21 1328 11/01/21 0432  11/03/21 0430 11/04/21 0357 11/05/21 0935  ALBUMIN 1.7* 1.7* 1.6* 1.9* 1.6*   Coagulation Profile: No results for input(s): INR, PROTIME in the last 168 hours.  CBG: Recent Labs  Lab 11/05/21 0752 11/05/21 1157 11/05/21 1632 11/05/21 2044 11/06/21 0739  GLUCAP 106* 155* 126* 134* 153*   Urine analysis:    Component Value Date/Time   COLORURINE GREEN (A) 10/29/2021 2005   APPEARANCEUR CLOUDY (A) 10/29/2021 2005   LABSPEC 1.020 10/29/2021 2005   PHURINE 6.0 10/29/2021 2005   GLUCOSEU NEGATIVE 10/29/2021 2005   HGBUR SMALL (A) 10/29/2021 2005   BILIRUBINUR NEGATIVE 10/29/2021 2005   KETONESUR 20 (A) 10/29/2021 2005   PROTEINUR 100 (A) 10/29/2021 2005   NITRITE POSITIVE (A) 10/29/2021 2005   LEUKOCYTESUR LARGE (A) 10/29/2021 2005   Sepsis Labs:  Recent Results (from the past 240 hour(s))  Resp Panel by RT-PCR (Flu A&B, Covid) Nasopharyngeal Swab     Status: None   Collection Time: 10/29/21  5:27 PM   Specimen: Nasopharyngeal Swab; Nasopharyngeal(NP) swabs in vial transport medium  Result Value Ref Range Status   SARS Coronavirus 2 by RT PCR NEGATIVE NEGATIVE Final    Comment: (NOTE) SARS-CoV-2 target nucleic acids are NOT DETECTED.  The SARS-CoV-2 RNA is generally detectable in upper respiratory specimens during the acute phase of infection. The lowest concentration of SARS-CoV-2 viral copies this assay can detect is 138 copies/mL. A negative result does not preclude SARS-Cov-2 infection and should not be used as the sole basis for treatment or other patient management decisions. A negative result may occur with  improper specimen collection/handling, submission of specimen other than nasopharyngeal swab, presence of viral mutation(s) within the areas targeted by this assay, and inadequate number of viral copies(<138 copies/mL). A negative result must be combined with clinical observations, patient history, and epidemiological information. The expected result is  Negative.  Fact Sheet for Patients:  EntrepreneurPulse.com.au  Fact Sheet for Healthcare Providers:  IncredibleEmployment.be  This test is no t yet approved or cleared by the [place] FDA and  has been authorized for detection and/or diagnosis of SARS-CoV-2 by FDA under an Emergency Use Authorization (EUA). This EUA will remain  in effect (meaning this test can be used) for the duration of the COVID-19 declaration under Section 564(b)(1) of the Act, 21 U.S.C.section 360bbb-3(b)(1), unless the authorization is terminated  or revoked sooner.       Influenza A by PCR NEGATIVE NEGATIVE Final   Influenza B by PCR NEGATIVE NEGATIVE Final    Comment: (NOTE) The Xpert Xpress SARS-CoV-2/FLU/RSV plus assay is intended as an aid in the diagnosis of influenza from Nasopharyngeal swab specimens and should not be used as a sole basis for treatment. Nasal washings and aspirates are unacceptable for Xpert Xpress SARS-CoV-2/FLU/RSV testing.  Fact Sheet for Patients: EntrepreneurPulse.com.au  Fact Sheet for Healthcare Providers: IncredibleEmployment.be  This test is not yet approved or cleared by the [place] FDA and has been authorized for detection and/or diagnosis of SARS-CoV-2 by FDA under an Emergency Use Authorization (EUA). This EUA will remain in effect (meaning this test can be used) for the duration of the COVID-19 declaration under Section 564(b)(1) of the Act, 21 U.S.C. section 360bbb-3(b)(1), unless the authorization is terminated or revoked.  Performed at [facility], [address] [place], [place] [address]  Culture, blood (routine x 2)     Status: None   Collection Time: 10/29/21  5:51 PM   Specimen: BLOOD  Result Value Ref Range Status   Specimen Description BLOOD BLOOD RIGHT ARM  Final   Special Requests   Final    BOTTLES DRAWN AEROBIC AND ANAEROBIC Blood Culture adequate volume    Culture   Final    NO GROWTH 6 DAYS Performed at [facility], [address], [place], [place] [address]    Report Status 11/04/2021 FINAL  Final  Culture, blood (routine x 2)     Status: None   Collection Time: 10/29/21  9:19 PM   Specimen: BLOOD  Result Value Ref Range Status   Specimen Description BLOOD central line  Final   Special Requests   Final    BOTTLES DRAWN AEROBIC AND ANAEROBIC Blood Culture adequate volume   Culture   Final    NO GROWTH 6 DAYS Performed at [facility], [address], [place], [place] [address]    Report Status 11/04/2021 FINAL  Final  MRSA Next Gen by PCR, Nasal     Status: Abnormal   Collection Time: 10/30/21  1:52 AM   Specimen: Nasal Mucosa; Nasal Swab  Result Value Ref Range Status   MRSA by PCR Next Gen DETECTED (A) NOT DETECTED Final    Comment: RESULT CALLED TO, READ BACK BY AND VERIFIED WITH: ASHLEY @ 1055 ON [id number] BY HENDERSON L (NOTE) The GeneXpert MRSA Assay (FDA approved for NASAL specimens only), is one component of a comprehensive MRSA colonization surveillance program. It is not intended to diagnose MRSA infection nor to guide or monitor treatment for MRSA infections. Test performance is not FDA approved in patients less than 53 years old. Performed at [facility], [address] [place], [place] [address]   Gastrointestinal Panel by PCR , Stool     Status: None   Collection Time: 10/30/21  5:35 PM   Specimen: Rectum; Stool  Result Value Ref Range Status   Campylobacter species NOT DETECTED NOT DETECTED Final   Plesimonas shigelloides NOT DETECTED NOT DETECTED Final   Salmonella species NOT DETECTED NOT DETECTED Final   Yersinia enterocolitica NOT DETECTED NOT DETECTED Final   Vibrio species NOT DETECTED NOT DETECTED Final   Vibrio cholerae NOT DETECTED NOT DETECTED Final   Enteroaggregative E coli (EAEC) NOT DETECTED NOT DETECTED Final   Enteropathogenic E coli (EPEC) NOT DETECTED NOT DETECTED Final   Enterotoxigenic E coli  (ETEC) NOT DETECTED NOT DETECTED Final   Shiga like toxin producing E coli (STEC) NOT DETECTED NOT DETECTED Final   Shigella/Enteroinvasive E coli (EIEC) NOT DETECTED NOT DETECTED Final   Cryptosporidium NOT DETECTED NOT DETECTED Final   Cyclospora cayetanensis NOT DETECTED NOT DETECTED Final   Entamoeba histolytica NOT DETECTED NOT DETECTED Final   Giardia lamblia NOT DETECTED NOT DETECTED Final   Adenovirus F40/41 NOT DETECTED NOT DETECTED Final   Astrovirus NOT DETECTED NOT DETECTED Final   Norovirus GI/GII NOT DETECTED NOT DETECTED Final   Rotavirus A NOT DETECTED NOT DETECTED Final   Sapovirus (I, II, IV, and V) NOT DETECTED NOT DETECTED Final    Comment: Performed at [facility], [address] [place], [place] [address]  C Difficile Quick Screen w PCR reflex     Status: None   Collection Time: 10/30/21  5:35 PM   Specimen: Rectum; Stool  Result Value Ref Range Status   C Diff antigen NEGATIVE NEGATIVE Final  C Diff toxin NEGATIVE NEGATIVE Final   C Diff interpretation No C. difficile detected.  Final    Comment: Performed at [facility], [address] [place], [place] [address]  Urine Culture     Status: None   Collection Time: 11/01/21  8:20 AM   Specimen: Urine, Catheterized  Result Value Ref Range Status   Specimen Description   Final    URINE, CATHETERIZED Performed at [facility], [address] [place], [place] [address]    Special Requests   Final    NONE Performed at [facility], [address] [place], [place] [address]    Culture   Final    NO GROWTH Performed at [facility], [place] [address] [place], [place] [address]    Report Status 11/02/2021 FINAL  Final    Radiology Studies: CT HEAD WO CONTRAST (5MM)  Result Date: 11/05/2021 CLINICAL DATA:  Mental status changes, combative EXAM: CT HEAD WITHOUT CONTRAST TECHNIQUE: Contiguous axial images were obtained from the base of the skull through the vertex without intravenous contrast.  COMPARISON:  02/13/2021 FINDINGS: Brain: Limited with some motion artifact. No acute intracranial hemorrhage, mass lesion, new infarction, midline shift, herniation, or extra-axial fluid collection. No focal mass effect or edema. Cisterns are patent. No cerebellar abnormality. Vascular: No hyperdense vessel or unexpected calcification. Skull: Similar abnormal thickened appearance of the skull involving the skull base and maxilla with ground-glass density suggesting fibrous dysplasia. Sinuses/Orbits: No acute finding. Other: None. IMPRESSION: No acute intracranial abnormality. 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 Electronically Signed   By: Jerilynn Mages.  Shick M.D.   On: 11/05/2021 13:43     Scheduled Meds:  sodium chloride   Intravenous Once   Chlorhexidine Gluconate Cloth  6 each Topical Daily   darbepoetin (ARANESP) injection - NON-DIALYSIS  40 mcg Subcutaneous Q Mon-1800   enoxaparin (LOVENOX) injection  30 mg Subcutaneous Q24H   feeding supplement (NEPRO CARB STEADY)  237 mL Oral TID PC   hydrocortisone sod succinate (SOLU-CORTEF) inj  100 mg Intravenous Q8H   influenza vac split quadrivalent PF  0.5 mL Intramuscular Tomorrow-1000   insulin aspart  0-5 Units Subcutaneous QHS   insulin aspart  0-9 Units Subcutaneous TID WC   midodrine  10 mg Oral TID WC   mupirocin ointment   Nasal BID   pneumococcal 23 valent vaccine  0.5 mL Intramuscular Tomorrow-1000   saccharomyces boulardii  250 mg Oral BID   sodium bicarbonate  1,300 mg Oral TID   Zinc Oxide   Topical QID   Continuous Infusions:  sodium chloride Stopped (10/29/21 2251)   ceFEPime (MAXIPIME) IV Stopped (11/05/21 2206)   lactated ringers 75 mL/hr at 11/05/21 2258   linezolid (ZYVOX) IV 600 mg (11/06/21 0027)   norepinephrine (LEVOPHED) Adult infusion Stopped (11/05/21 1657)   potassium chloride      LOS: 7 days   Barton Dubois M.D on 11/06/2021 at 8:42 AM  Go to [email or web address] -  for contact info  [facility] - Office  [phone]  If 7PM-7AM, please contact night-coverage [email or web address] Password [facility] 11/06/2021, 8:42 AM

## 2021-11-07 LAB — GLUCOSE, CAPILLARY
Glucose-Capillary: 180 mg/dL — ABNORMAL HIGH (ref 70–99)
Glucose-Capillary: 189 mg/dL — ABNORMAL HIGH (ref 70–99)
Glucose-Capillary: 161 mg/dL — ABNORMAL HIGH (ref 70–99)
Glucose-Capillary: 138 mg/dL — ABNORMAL HIGH (ref 70–99)

## 2021-11-07 LAB — RENAL FUNCTION PANEL
Albumin: 1.6 g/dL — ABNORMAL LOW (ref 3.5–5.0)
Anion gap: 9 (ref 5–15)
BUN: 30 mg/dL — ABNORMAL HIGH (ref 6–20)
CO2: 17 mmol/L — ABNORMAL LOW (ref 22–32)
Calcium: 8.2 mg/dL — ABNORMAL LOW (ref 8.9–10.3)
Chloride: 113 mmol/L — ABNORMAL HIGH (ref 98–111)
Creatinine, Ser: 3.52 mg/dL — ABNORMAL HIGH (ref 0.44–1.00)
GFR, Estimated: 15 mL/min — ABNORMAL LOW (ref >60)
Glucose, Bld: 198 mg/dL — ABNORMAL HIGH (ref 70–99)
Phosphorus: 2.8 mg/dL (ref 2.5–4.6)
Potassium: 2.6 mmol/L — CL (ref 3.5–5.1)
Sodium: 139 mmol/L (ref 135–145)

## 2021-11-07 MED ORDER — POTASSIUM CHLORIDE 10 MEQ/100ML IV SOLN
10.0000 meq | INTRAVENOUS | Status: AC
  Administered 2021-11-07 (×4): 10 meq via INTRAVENOUS
  Filled 2021-11-07 (×4): qty 100

## 2021-11-07 MED ORDER — MAGNESIUM SULFATE 50 % IJ SOLN: 1.0000 g | Freq: Once | INTRAMUSCULAR | Status: DC

## 2021-11-07 MED ORDER — MAGNESIUM SULFATE IN D5W 1-5 GM/100ML-% IV SOLN
1.0000 g | Freq: Once | INTRAVENOUS | Status: AC
  Administered 2021-11-07: 09:00:00 1 g via INTRAVENOUS
  Filled 2021-11-07: qty 100

## 2021-11-07 MED ORDER — ALBUMIN HUMAN 25 % IV SOLN
25.0000 g | Freq: Once | INTRAVENOUS | Status: AC
  Administered 2021-11-07: 11:00:00 25 g via INTRAVENOUS
  Filled 2021-11-07: qty 100

## 2021-11-07 MED ORDER — FUROSEMIDE 10 MG/ML IJ SOLN
80.0000 mg | Freq: Once | INTRAMUSCULAR | Status: AC
  Administered 2021-11-07: 12:00:00 80 mg via INTRAVENOUS
  Filled 2021-11-07: qty 8

## 2021-11-07 NOTE — Progress Notes (Signed)
PROGRESS NOTE     Courtney Dyer, is a 55 y.o. female, DOB - 25-Jun-1966, ZDG:[id number]  Admit date - 10/29/2021   Admitting Physician Bernadette Hoit, DO  Outpatient Primary MD for the patient is Pcp, No  LOS - 8  No chief complaint on file.     Altered mentation  Brief Narrative:  55 y.o. female with medical history significant for hyperlipidemia, T2DM, gout, GERD , HTN, CKD IV admitted on 10/30/2021 with AKI on CKD stage IV, severe electrolyte derangement, and persistent  severe dehydration partly due to persistent nausea and vomiting with persistent hypotension requiring IV fluids on IV Levophed for pressure support in the setting of sepsis and dehydration  Assessment & Plan:   Principal Problem:   Sepsis secondary to UTI [facility]) Active Problems:   Acute kidney injury ([place])   Hypokalemia   Symptomatic anemia   Hyponatremia   High anion gap metabolic acidosis   Dehydration   Leukocytosis   Microcytic anemia   Hypoalbuminemia due to protein-calorie malnutrition (HCC)   Acute metabolic encephalopathy   Mixed hyperlipidemia   Type 2 diabetes mellitus (HCC)   Gout   Prolonged QT interval   Obesity, Class III, BMI 40-49.9 (morbid obesity) ([place])   1)Persistent Hypotension/hemodynamic instability--- in the setting of dehydration and presumed sepsis -continues to require IV Levophed for pressure support (levophed at 5 mg/hr) -Continues to have low BP and is requiring pressors. -Latest concerns for UTI and pneumonia. -Continue Zyvox and cefepime. Day 10/10 -Continue 3 times daily midodrine; limited with current AMS and ability to take PO's safely at times. -cortisol found to be low and patient started on solucortef. -Currently off Levophed and albuterol with improvement in her BP. -Long goals of care discussion at bedside; family had decided patient to be DNR/DNI with further reevaluation in her condition in the next 48 hours. -Overall patient is not a candidate for  long-term hemodialysis (as discussed with Dr. Candiss Norse nephrology). -Blood, urine and GI cultures remain negative to date -Continue adjusted IV fluid rate and albumin infusion as per nephrology recommendations today. -patient is starting to demonstrate 3rd spacing.  2)AKi on CKD IV with anion gap metabolic acidosis--- suspect ATN --patient with significant dehydration due to persistent nausea and vomiting, also suspect kidney injury secondary to persistent hypotension -Solitary L kidney, hx/o R nephrectomy;  - CT a/p with dysplastic right kidney and no stones or hydronephrosis on the left.   creatinine was around 10, BUN around 160 -Scr trending up 3.5 currently. -Urine output has dropped significantly in the last 36 hours; after further is dropping; per nephrology if renal condition further discussion with nephrology service no recommendation for future hemodialysis or CVVH provided at this time.  Family has been updated and in agreement.  Patient with significant multiple comorbidities, dysfunctional physically with very poor nutrition status.   -Continue IV fluids and IV albumin; follow further recommendations by nephrology service. -Right neck temporary HD catheter removed on 11/03/2021 per nephrology; if further dialysis required will need to be done through tunneled catheter and CVVH at this point. -Continue avoiding nephrotoxic agents.  3) acute on chronic anemia--- no evidence of ongoing bleeding suspect some component of chronic anemia of CKD -Serum iron is 81 iron saturation 71, ferritin is not low,  -B12 and folate are stable. -Transition threshold/ball is for Hgb > 7 -Patient has received a total of 2 units of PRBC during this admission -ESA treatment as per nephrology service.  4)Chronic breast wound--- does not appear infected,  Healing left breast periareolar wound--please see ultrasound report from 10/27/2021. -Continue wound  care.  5)Hypokalemia/hyponatremia/hypophosphatemia--electrolyte derangement due to persistent diarrhea and poor oral intake. -Continue to follow electrolytes and further replete as needed.  6)DM2- A1C 5.2 reflecting excellent diabetic control PTA -Continue the use of Novolog/Humalog Sliding scale insulin with Accu-Cheks/Fingersticks as ordered   7)Social/Ethics--patient's aunt [name] is primary contact and decision maker [phone]) -NOK updated at bedside -Goals of care discussion decision has been made for DNR/DNI -Continue treatment otherwise reassess in the next 24-48 hours. -Overall prognosis is guarded -Palliative care has been consulted.  8)Sepsis with Septic Shock--- suspect related to UTI and pneumonia -PCT 0.66 -WBC has trended down appropriately overall  -Culture data and antibiotics as above #1 -Currently off Levophed. -no fever.  9) Acute metabolic encephalopathy secondary to sepsis with septic shock and persistent hypotension, dehydration, worsening renal function and electrolyte and metabolic derangement as mentioned above -Improved with hemodialysis; patient is still no eating/drinking -mentation is not back to baseline yet. -CT scan demonstrating no acute intracranial normalities; given prolonged episodes of hypotension there is concern for hypoxic injury.  10)Diarrhea-- C diff and GI pathogen/culture is Negative -Rectal tube placed continue IV fluids, continue to replace electrolytes as needed and provide supportive care -diarrhea continue. -Okay to use Imodium and Lomotil as needed -Florastor twice a day has been added.  11) severe protein calorie malnutrition -In the setting of acute illness and inability to take adequate nutrition/hydration. -Low albumin appreciated in her labs. -Family in agreement not to pursued artificial nutrition around.  Disposition/Need for in-Hospital Stay- patient unable to be discharged at this time due to -Persistent  hypotension requiring IV Levophed for pressure support -Ongoing acute on chronic renal failure. -Patient prognosis continue to be poor and declining, she might qualify for inpatient hospice. Waiting discussion with palliative and further clinical response in the next 24-48 hours.  CRITICAL CARE Performed by: Barton Dubois   Total critical care time: 50 minutes  Critical care time was exclusive of separately billable procedures and treating other patients.  Critical care was necessary to treat or prevent imminent or life-threatening deterioration.  Critical care was time spent personally by me on the following activities: development of treatment plan with patient and/or surrogate as well as nursing, discussions with consultants, evaluation of patient's response to treatment, examination of patient, obtaining history from patient or surrogate, ordering and performing treatments and interventions, ordering and review of laboratory studies, ordering and review of radiographic studies, pulse oximetry and re-evaluation of patient's condition.    Status is: Inpatient  Remains inpatient appropriate because: As above  Disposition: The patient is from: Long-term SNF resident              Anticipated d/c is to: SNF              Anticipated d/c date is: > 3 days              Patient currently is not medically stable to d/c.   Barriers: Not Clinically Stable-   Code Status :  -  Code Status: DNR   Family Communication:   patient's aunt [name] is primary contact and decision maker [phone]). Aunt updated at bedside on 12/23; cousins updated at bedside on 12/24  Consults  : Nephrology/general surgery/palliative care -case discussed with Dr. Candiss Norse (nephrology) on 11/05/21. -chart reviewed for recommendations by Dr. Arty Baumgartner (nephrology service 11/06/21) -Case discussed with Dr. Marval Regal (nephrology service) 11/07/2021.  DVT Prophylaxis  :   -  SCDs  enoxaparin (LOVENOX)  injection 30 mg Start: 10/30/21 1000 SCDs Start: 10/30/21 0152  Lab Results  Component Value Date   PLT 107 (L) 11/06/2021    Inpatient Medications  Scheduled Meds:  sodium chloride   Intravenous Once   Chlorhexidine Gluconate Cloth  6 each Topical Daily   darbepoetin (ARANESP) injection - NON-DIALYSIS  40 mcg Subcutaneous Q Mon-1800   enoxaparin (LOVENOX) injection  30 mg Subcutaneous Q24H   feeding supplement (NEPRO CARB STEADY)  237 mL Oral TID PC   hydrocortisone sod succinate (SOLU-CORTEF) inj  100 mg Intravenous Q8H   insulin aspart  0-5 Units Subcutaneous QHS   insulin aspart  0-9 Units Subcutaneous TID WC   midodrine  10 mg Oral TID WC   mupirocin ointment   Nasal BID   saccharomyces boulardii  250 mg Oral BID   sodium bicarbonate  1,300 mg Oral TID   Zinc Oxide   Topical QID   Continuous Infusions:  sodium chloride Stopped (10/29/21 2251)   ceFEPime (MAXIPIME) IV 2 g (11/06/21 2013)   lactated ringers 100 mL/hr at 11/07/21 1545   linezolid (ZYVOX) IV Stopped (11/07/21 1257)   norepinephrine (LEVOPHED) Adult infusion Stopped (11/05/21 1657)   PRN Meds:.alteplase, loperamide, oxyCODONE, pentafluoroprop-tetrafluoroeth, prochlorperazine   Anti-infectives (From admission, onward)    Start     Dose/Rate Route Frequency Ordered Stop   11/02/21 2000  ceFEPIme (MAXIPIME) 2 g in sodium chloride 0.9 % 100 mL IVPB        2 g 200 mL/hr over 30 Minutes Intravenous Every 24 hours 11/02/21 1316 11/08/21 1959   11/02/21 1800  linezolid (ZYVOX) IVPB 600 mg        600 mg 300 mL/hr over 60 Minutes Intravenous Every 12 hours 11/02/21 1247 11/08/21 0959   10/31/21 1400  vancomycin (VANCOCIN) IVPB 750 mg/150 ml premix        750 mg 150 mL/hr over 60 Minutes Intravenous Every Dialysis 10/31/21 0835 10/31/21 1840   10/30/21 2000  vancomycin (VANCOREADY) IVPB 1500 mg/300 mL  Status:  Discontinued        1,500 mg 150 mL/hr over 120 Minutes Intravenous Every 24 hours 10/29/21 1806  10/29/21 2007   10/30/21 2000  ceFEPIme (MAXIPIME) 1 g in sodium chloride 0.9 % 100 mL IVPB  Status:  Discontinued        1 g 200 mL/hr over 30 Minutes Intravenous Every 24 hours 10/29/21 2009 11/02/21 1316   10/30/21 1600  vancomycin (VANCOREADY) IVPB 750 mg/150 mL        750 mg 150 mL/hr over 60 Minutes Intravenous Every M-W-F (Hemodialysis) 10/30/21 1146 10/30/21 2006   10/30/21 1136  vancomycin variable dose per unstable renal function (pharmacist dosing)  Status:  Discontinued         Does not apply See admin instructions 10/30/21 1137 11/02/21 1247   10/30/21 0400  ceFEPIme (MAXIPIME) 2 g in sodium chloride 0.9 % 100 mL IVPB  Status:  Discontinued        2 g 200 mL/hr over 30 Minutes Intravenous Every 8 hours 10/29/21 1806 10/29/21 2007   10/29/21 2015  ceFEPIme (MAXIPIME) 1 g in sodium chloride 0.9 % 100 mL IVPB  Status:  Discontinued        1 g 200 mL/hr over 30 Minutes Intravenous Every 24 hours 10/29/21 2007 10/29/21 2009   10/29/21 1800  ceFEPIme (MAXIPIME) 2 g in sodium chloride 0.9 % 100 mL IVPB        2  g 200 mL/hr over 30 Minutes Intravenous  Once 10/29/21 1747 10/29/21 1852   10/29/21 1800  metroNIDAZOLE (FLAGYL) IVPB 500 mg        500 mg 100 mL/hr over 60 Minutes Intravenous  Once 10/29/21 1747 10/29/21 2004   10/29/21 1800  vancomycin (VANCOCIN) IVPB 1000 mg/200 mL premix        1,000 mg 200 mL/hr over 60 Minutes Intravenous  Once 10/29/21 1747 10/29/21 2149   10/29/21 1800  vancomycin (VANCOCIN) IVPB 1000 mg/200 mL premix        1,000 mg 200 mL/hr over 60 Minutes Intravenous  Once 10/29/21 1754 10/29/21 2100       Subjective: Candice Camp no fever, no nausea, no vomiting, no chest pain.  Patient remains unable to talk or following commands.  No eating or drinking on her own.  Significant decrease in urine output in the last 26 hours (creatinine trending up).  Off Levophed and stable blood pressure appreciated.  Objective: Vitals:   11/07/21 1200 11/07/21 1224  11/07/21 1300 11/07/21 1500  BP: 105/77   93/65  Pulse: 84  86 83  Resp: 16  13 (!) 4  Temp:  97.6 F (36.4 C)    TempSrc:  Axillary    SpO2: 100%  100% 100%  Weight:      Height:        Intake/Output Summary (Last 24 hours) at 11/07/2021 1550 Last data filed at 11/07/2021 1545 Gross per 24 hour  Intake 4329.04 ml  Output 475 ml  Net 3854.04 ml   Filed Weights   11/02/21 0900 11/03/21 0443 11/04/21 0516  Weight: 87.1 kg 85.4 kg 89.3 kg    Physical Exam General exam: Afebrile, no nausea vomiting.  Off Levophed for the last 24 hours.  Continue to have significant decrease in her urine output (approximately 25-50 mL in the last 26 hours).  Physical exam demonstrated 3rd spacing swelling Respiratory system: Clear to auscultation. Respiratory effort normal.  No using accessory muscle. Cardiovascular system:RRR. No rubs, no gallops.  Unable to properly assess JVD with body habitus. Gastrointestinal system: Abdomen is nontender to palpation, positive bowel sounds, obese.   Central nervous system: Patient open her eyes, unable to follow commands, moving limbs spontaneously.  No talking Extremities: No cyanosis or clubbing. Skin: No petechiae; left IJ in place.  Breakdown of skin appreciated in the posterior aspects of her thighs and buttocks. Psychiatry: Unable to properly assess secondary to encephalopathic process.  Data Reviewed: I have personally reviewed following labs and imaging studies  CBC: Recent Labs  Lab 11/02/21 0125 11/03/21 0430 11/04/21 0357 11/05/21 0935 11/06/21 0431 11/06/21 1110  WBC 18.8* 15.4* 14.8* 21.9* 19.5*  --   NEUTROABS  --   --   --  17.6*  --   --   HGB 8.2* 7.9* 7.2* 7.1* 7.3*  --   HCT 24.9* 23.7* 22.7* 22.1* 22.3*  --   MCV 81.6 82.0 85.0 84.4 83.8  --   PLT 127* 99* 89* 85* 90* 631*   Basic Metabolic Panel: Recent Labs  Lab 11/02/21 0125 11/03/21 0430 11/04/21 0357 11/05/21 0935 11/06/21 0431 11/07/21 0503  NA 134* 134* 134* 136  135 139  K 3.0* 3.3* 4.6 3.2* 2.9* 2.6*  CL 103 105 107 111 109 113*  CO2 21* 20* 20* 16* 17* 17*  GLUCOSE 138* 139* 125* 119* 169* 198*  BUN 32* 32* 30* 28* 27* 30*  CREATININE 3.22* 3.51* 3.40* 3.34* 3.41* 3.52*  CALCIUM 7.8*  8.1* 8.2* 7.8* 7.8* 8.2*  PHOS 3.7 3.2 3.0 2.2*  --  2.8   GFR: Estimated Creatinine Clearance: 20 mL/min (A) (by C-G formula based on SCr of 3.52 mg/dL (H)).  Liver Function Tests: Recent Labs  Lab 11/01/21 0432 11/03/21 0430 11/04/21 0357 11/05/21 0935 11/07/21 0503  ALBUMIN 1.7* 1.6* 1.9* 1.6* 1.6*   Coagulation Profile: Recent Labs  Lab 11/06/21 1110  INR 1.8*    CBG: Recent Labs  Lab 11/06/21 1135 11/06/21 1817 11/06/21 2056 11/07/21 0940 11/07/21 1205  GLUCAP 149* 183* 183* 180* 189*   Urine analysis:    Component Value Date/Time   COLORURINE GREEN (A) 10/29/2021 2005   APPEARANCEUR CLOUDY (A) 10/29/2021 2005   LABSPEC 1.020 10/29/2021 2005   PHURINE 6.0 10/29/2021 2005   GLUCOSEU NEGATIVE 10/29/2021 2005   HGBUR SMALL (A) 10/29/2021 2005   BILIRUBINUR NEGATIVE 10/29/2021 2005   KETONESUR 20 (A) 10/29/2021 2005   PROTEINUR 100 (A) 10/29/2021 2005   NITRITE POSITIVE (A) 10/29/2021 2005   LEUKOCYTESUR LARGE (A) 10/29/2021 2005   Sepsis Labs:  Recent Results (from the past 240 hour(s))  Resp Panel by RT-PCR (Flu A&B, Covid) Nasopharyngeal Swab     Status: None   Collection Time: 10/29/21  5:27 PM   Specimen: Nasopharyngeal Swab; Nasopharyngeal(NP) swabs in vial transport medium  Result Value Ref Range Status   SARS Coronavirus 2 by RT PCR NEGATIVE NEGATIVE Final    Comment: (NOTE) SARS-CoV-2 target nucleic acids are NOT DETECTED.  The SARS-CoV-2 RNA is generally detectable in upper respiratory specimens during the acute phase of infection. The lowest concentration of SARS-CoV-2 viral copies this assay can detect is 138 copies/mL. A negative result does not preclude SARS-Cov-2 infection and should not be used as the sole  basis for treatment or other patient management decisions. A negative result may occur with  improper specimen collection/handling, submission of specimen other than nasopharyngeal swab, presence of viral mutation(s) within the areas targeted by this assay, and inadequate number of viral copies(<138 copies/mL). A negative result must be combined with clinical observations, patient history, and epidemiological information. The expected result is Negative.  Fact Sheet for Patients:  EntrepreneurPulse.com.au  Fact Sheet for Healthcare Providers:  IncredibleEmployment.be  This test is no t yet approved or cleared by the [place] FDA and  has been authorized for detection and/or diagnosis of SARS-CoV-2 by FDA under an Emergency Use Authorization (EUA). This EUA will remain  in effect (meaning this test can be used) for the duration of the COVID-19 declaration under Section 564(b)(1) of the Act, 21 U.S.C.section 360bbb-3(b)(1), unless the authorization is terminated  or revoked sooner.       Influenza A by PCR NEGATIVE NEGATIVE Final   Influenza B by PCR NEGATIVE NEGATIVE Final    Comment: (NOTE) The Xpert Xpress SARS-CoV-2/FLU/RSV plus assay is intended as an aid in the diagnosis of influenza from Nasopharyngeal swab specimens and should not be used as a sole basis for treatment. Nasal washings and aspirates are unacceptable for Xpert Xpress SARS-CoV-2/FLU/RSV testing.  Fact Sheet for Patients: EntrepreneurPulse.com.au  Fact Sheet for Healthcare Providers: IncredibleEmployment.be  This test is not yet approved or cleared by the [place] FDA and has been authorized for detection and/or diagnosis of SARS-CoV-2 by FDA under an Emergency Use Authorization (EUA). This EUA will remain in effect (meaning this test can be used) for the duration of the COVID-19 declaration under Section 564(b)(1) of the Act,  21 U.S.C. section 360bbb-3(b)(1), unless the authorization  is terminated or revoked.  Performed at [facility], [address], [place], [place] [address]   Culture, blood (routine x 2)     Status: None   Collection Time: 10/29/21  5:51 PM   Specimen: BLOOD  Result Value Ref Range Status   Specimen Description BLOOD BLOOD RIGHT ARM  Final   Special Requests   Final    BOTTLES DRAWN AEROBIC AND ANAEROBIC Blood Culture adequate volume   Culture   Final    NO GROWTH 6 DAYS Performed at [facility], [address], [place], [place] [address]    Report Status 11/04/2021 FINAL  Final  Culture, blood (routine x 2)     Status: None   Collection Time: 10/29/21  9:19 PM   Specimen: BLOOD  Result Value Ref Range Status   Specimen Description BLOOD central line  Final   Special Requests   Final    BOTTLES DRAWN AEROBIC AND ANAEROBIC Blood Culture adequate volume   Culture   Final    NO GROWTH 6 DAYS Performed at [facility], [address], [place], [place] [address]    Report Status 11/04/2021 FINAL  Final  MRSA Next Gen by PCR, Nasal     Status: Abnormal   Collection Time: 10/30/21  1:52 AM   Specimen: Nasal Mucosa; Nasal Swab  Result Value Ref Range Status   MRSA by PCR Next Gen DETECTED (A) NOT DETECTED Final    Comment: RESULT CALLED TO, READ BACK BY AND VERIFIED WITH: ASHLEY @ 1055 ON [id number] BY HENDERSON L (NOTE) The GeneXpert MRSA Assay (FDA approved for NASAL specimens only), is one component of a comprehensive MRSA colonization surveillance program. It is not intended to diagnose MRSA infection nor to guide or monitor treatment for MRSA infections. Test performance is not FDA approved in patients less than 76 years old. Performed at [facility], [address], [place], [place] [address]   Gastrointestinal Panel by PCR , Stool     Status: None   Collection Time: 10/30/21  5:35 PM   Specimen: Rectum; Stool  Result Value Ref Range Status   Campylobacter species NOT  DETECTED NOT DETECTED Final   Plesimonas shigelloides NOT DETECTED NOT DETECTED Final   Salmonella species NOT DETECTED NOT DETECTED Final   Yersinia enterocolitica NOT DETECTED NOT DETECTED Final   Vibrio species NOT DETECTED NOT DETECTED Final   Vibrio cholerae NOT DETECTED NOT DETECTED Final   Enteroaggregative E coli (EAEC) NOT DETECTED NOT DETECTED Final   Enteropathogenic E coli (EPEC) NOT DETECTED NOT DETECTED Final   Enterotoxigenic E coli (ETEC) NOT DETECTED NOT DETECTED Final   Shiga like toxin producing E coli (STEC) NOT DETECTED NOT DETECTED Final   Shigella/Enteroinvasive E coli (EIEC) NOT DETECTED NOT DETECTED Final   Cryptosporidium NOT DETECTED NOT DETECTED Final   Cyclospora cayetanensis NOT DETECTED NOT DETECTED Final   Entamoeba histolytica NOT DETECTED NOT DETECTED Final   Giardia lamblia NOT DETECTED NOT DETECTED Final   Adenovirus F40/41 NOT DETECTED NOT DETECTED Final   Astrovirus NOT DETECTED NOT DETECTED Final   Norovirus GI/GII NOT DETECTED NOT DETECTED Final   Rotavirus A NOT DETECTED NOT DETECTED Final   Sapovirus (I, II, IV, and V) NOT DETECTED NOT DETECTED Final    Comment: Performed at [facility], [address] [place], [place] [address]  C Difficile Quick Screen w PCR reflex     Status: None   Collection Time: 10/30/21  5:35 PM   Specimen: Rectum;  Stool  Result Value Ref Range Status   C Diff antigen NEGATIVE NEGATIVE Final   C Diff toxin NEGATIVE NEGATIVE Final   C Diff interpretation No C. difficile detected.  Final    Comment: Performed at [facility], [address] [place], [place] [address]  Urine Culture     Status: None   Collection Time: 11/01/21  8:20 AM   Specimen: Urine, Catheterized  Result Value Ref Range Status   Specimen Description   Final    URINE, CATHETERIZED Performed at [facility], [address], [place], [place] [address]    Special Requests   Final    NONE Performed at [facility], [address]  [place], [place] [address]    Culture   Final    NO GROWTH Performed at [facility], [place] [address], [place], [place] [address]    Report Status 11/02/2021 FINAL  Final    Radiology Studies: No results found.   Scheduled Meds:  sodium chloride   Intravenous Once   Chlorhexidine Gluconate Cloth  6 each Topical Daily   darbepoetin (ARANESP) injection - NON-DIALYSIS  40 mcg Subcutaneous Q Mon-1800   enoxaparin (LOVENOX) injection  30 mg Subcutaneous Q24H   feeding supplement (NEPRO CARB STEADY)  237 mL Oral TID PC   hydrocortisone sod succinate (SOLU-CORTEF) inj  100 mg Intravenous Q8H   insulin aspart  0-5 Units Subcutaneous QHS   insulin aspart  0-9 Units Subcutaneous TID WC   midodrine  10 mg Oral TID WC   mupirocin ointment   Nasal BID   saccharomyces boulardii  250 mg Oral BID   sodium bicarbonate  1,300 mg Oral TID   Zinc Oxide   Topical QID   Continuous Infusions:  sodium chloride Stopped (10/29/21 2251)   ceFEPime (MAXIPIME) IV 2 g (11/06/21 2013)   lactated ringers 100 mL/hr at 11/07/21 1545   linezolid (ZYVOX) IV Stopped (11/07/21 1257)   norepinephrine (LEVOPHED) Adult infusion Stopped (11/05/21 1657)    LOS: 8 days   Barton Dubois M.D on 11/07/2021 at 3:50 PM  Go to [email or web address] - for contact info  [facility] - Office  [phone]  If 7PM-7AM, please contact night-coverage [email or web address] Password [email or web address] 11/07/2021, 3:50 PM

## 2021-11-07 NOTE — Progress Notes (Signed)
Patient very obtunded, refusing to take medications, not following any commands. Attempted to give meds crushed with ice cream. Patient still refusing, tightens mouth and spits medication out. Md notified

## 2021-11-07 NOTE — Progress Notes (Signed)
Patient ID: Courtney Dyer, female   DOB: Oct 03, 1966, 55 y.o.   MRN: [id number] S: Remains unresponsive and oliguric.  Family and Dr. Dyann Kief at the bedside. O:BP 106/84    Pulse 85    Temp (!) 97.5 F (36.4 C) (Axillary)    Resp 13    Ht 5\' 4"  (1.626 m)    Wt 89.3 kg    LMP 09/26/2015    SpO2 100%    BMI 33.79 kg/m   Intake/Output Summary (Last 24 hours) at 11/07/2021 1043 Last data filed at 11/07/2021 1000 Gross per 24 hour  Intake 2623.39 ml  Output 495 ml  Net 2128.39 ml   Intake/Output: I/O last 3 completed shifts: In: 4311.8 [P.O.:310; I.V.:2776; Other:30; IV Piggyback:1195.9] Out: 5945 [Urine:240; OPFYT:2446]  Intake/Output this shift:  Total I/O In: -  Out: 325 [Urine:25; Stool:300] Weight change:  Gen: resting in bed, unresponsive and nonverbal CVS:RRR Resp:scattered rhonchi bilaterally Abd: +BS, soft, NT/ND Ext:1+ pitting edema of extremities  Recent Labs  Lab 10/31/21 1328 11/01/21 0432 11/02/21 0125 11/03/21 0430 11/04/21 0357 11/05/21 0935 11/06/21 0431 11/07/21 0503  NA 129* 135 134* 134* 134* 136 135 139  K 3.0* 3.9 3.0* 3.3* 4.6 3.2* 2.9* 2.6*  CL 104 103 103 105 107 111 109 113*  CO2 17* 22 21* 20* 20* 16* 17* 17*  GLUCOSE 131* 149* 138* 139* 125* 119* 169* 198*  BUN 73* 34* 32* 32* 30* 28* 27* 30*  CREATININE 5.52* 2.98* 3.22* 3.51* 3.40* 3.34* 3.41* 3.52*  ALBUMIN 1.7* 1.7*  --  1.6* 1.9* 1.6*  --  1.6*  CALCIUM 7.5* 7.6* 7.8* 8.1* 8.2* 7.8* 7.8* 8.2*  PHOS 1.4* 1.5* 3.7 3.2 3.0 2.2*  --  2.8   Liver Function Tests: Recent Labs  Lab 11/04/21 0357 11/05/21 0935 11/07/21 0503  ALBUMIN 1.9* 1.6* 1.6*   No results for input(s): LIPASE, AMYLASE in the last 168 hours. No results for input(s): AMMONIA in the last 168 hours. CBC: Recent Labs  Lab 11/02/21 0125 11/03/21 0430 11/04/21 0357 11/05/21 0935 11/06/21 0431 11/06/21 1110  WBC 18.8* 15.4* 14.8* 21.9* 19.5*  --   NEUTROABS  --   --   --  17.6*  --   --   HGB 8.2* 7.9* 7.2* 7.1*  7.3*  --   HCT 24.9* 23.7* 22.7* 22.1* 22.3*  --   MCV 81.6 82.0 85.0 84.4 83.8  --   PLT 127* 99* 89* 85* 90* 107*   Cardiac Enzymes: No results for input(s): CKTOTAL, CKMB, CKMBINDEX, TROPONINI in the last 168 hours. CBG: Recent Labs  Lab 11/06/21 0739 11/06/21 1135 11/06/21 1817 11/06/21 2056 11/07/21 0940  GLUCAP 153* 149* 183* 183* 180*    Iron Studies: No results for input(s): IRON, TIBC, TRANSFERRIN, FERRITIN in the last 72 hours. Studies/Results: CT HEAD WO CONTRAST (5MM)  Result Date: 11/05/2021 CLINICAL DATA:  Mental status changes, combative EXAM: CT HEAD WITHOUT CONTRAST TECHNIQUE: Contiguous axial images were obtained from the base of the skull through the vertex without intravenous contrast. COMPARISON:  02/13/2021 FINDINGS: Brain: Limited with some motion artifact. No acute intracranial hemorrhage, mass lesion, new infarction, midline shift, herniation, or extra-axial fluid collection. No focal mass effect or edema. Cisterns are patent. No cerebellar abnormality. Vascular: No hyperdense vessel or unexpected calcification. Skull: Similar abnormal thickened appearance of the skull involving the skull base and maxilla with ground-glass density suggesting fibrous dysplasia. Sinuses/Orbits: No acute finding. Other: None. IMPRESSION: No acute intracranial abnormality. 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 Electronically Signed   By: Jerilynn Mages.  Shick M.D.   On: 11/05/2021 13:43    sodium chloride   Intravenous Once   Chlorhexidine Gluconate Cloth  6 each Topical Daily   darbepoetin (ARANESP) injection - NON-DIALYSIS  40 mcg Subcutaneous Q Mon-1800   enoxaparin (LOVENOX) injection  30 mg Subcutaneous Q24H   feeding supplement (NEPRO CARB STEADY)  237 mL Oral TID PC   hydrocortisone sod succinate (SOLU-CORTEF) inj  100 mg Intravenous Q8H   insulin aspart  0-5 Units Subcutaneous QHS   insulin aspart  0-9 Units Subcutaneous TID WC    midodrine  10 mg Oral TID WC   mupirocin ointment   Nasal BID   saccharomyces boulardii  250 mg Oral BID   sodium bicarbonate  1,300 mg Oral TID   Zinc Oxide   Topical QID    BMET    Component Value Date/Time   NA 139 11/07/2021 0503   K 2.6 (LL) 11/07/2021 0503   CL 113 (H) 11/07/2021 0503   CO2 17 (L) 11/07/2021 0503   GLUCOSE 198 (H) 11/07/2021 0503   BUN 30 (H) 11/07/2021 0503   CREATININE 3.52 (H) 11/07/2021 0503   CALCIUM 8.2 (L) 11/07/2021 0503   GFRNONAA 15 (L) 11/07/2021 0503   GFRAA >60 11/26/2015 2020   CBC    Component Value Date/Time   WBC 19.5 (H) 11/06/2021 0431   RBC 2.66 (L) 11/06/2021 0431   HGB 7.3 (L) 11/06/2021 0431   HCT 22.3 (L) 11/06/2021 0431   PLT 107 (L) 11/06/2021 1110   MCV 83.8 11/06/2021 0431   MCH 27.4 11/06/2021 0431   MCHC 32.7 11/06/2021 0431   RDW 18.5 (H) 11/06/2021 0431   LYMPHSABS 2.3 11/05/2021 0935   MONOABS 1.1 (H) 11/05/2021 0935   EOSABS 0.4 11/05/2021 0935   BASOSABS 0.1 11/05/2021 0935    Assessment/Plan:   AKI/CKD stage IIIb - presumably ischemic ATN in setting of sepsis and volume depletion with solitary functioning kidney.  S/p HD on 12/16 and 12/17 with some renal recovery, however Scr has reached a plateau of 3.4 and now UOP dropping.  She remains hypotensive.   I spoke with family and Dr. Dyann Kief.  Prior to this hospitalization, she was bedbound in SNF.  They do not want to see her suffering and we did discuss the chronicity and severity of her underlying CKD.  I would not recommend resuming HD or CVVHD given her multiple co-morbidities, and poor functional, cognitive, and nutritional status.  They are in agreement not to pursue RRT of any kind (she would likely end up in a longterm care facility for the rest of her life) Will try IV albumin and IV lasix 80 mg x 1 to see if she will start to increase UOP given volume overload on exam. No urgent indication for dialysis at this time Avoid nephrotoxic agents such as IV  contrast, NSAIDs, phosphate containing bowel preps Maintain adequate hydration and BP support Shock/sepsis due to UTI although all cultures negative to date.   Currently on midodrine but concerned about safety of po meds given AMS Recommend ECHO to evaluate cardiac function. Resume levophed if needed. Hypokalemia - due to GI losses.  Receiving IV KCl replacement today and follow.  Nonanion gap metabolic acidosis - presumably due to GI losses with diarrhea.  Bicarb increased yesterday and started on lactated ringers. Anemia of CKD stage III and acute illness - started on Aranesp, transfuse prn Thrombocytopenia -  possible drug related to abx.  Consider hematology evaluation for possible HUS or TTP.  Will order DIC panel as well as LDH and ADAMTS13 activity. Diarrhea - likely due to antibiotics as all stool cultures negative Severe protein malnutrition - not able to eat on her own due to AMS, consider tube feeds Acute metabolic encephalopathy - pt remains lethargic and nonverbal.  CT scan of brain without acute abnormality. Disposition - poor overall prognosis and recommend transition to comfort measures only.  Pt is now DNR and awaiting family meeting with palliative care.  Donetta Potts, MD [facility] [phone]  She will not be seen tomorrow due to the holiday, however her labs and orders will be reviewed remotely and the on call Nephrologist can answer any questions should they arise.

## 2021-11-08 LAB — CBC
HCT: 19.7 % — ABNORMAL LOW (ref 36.0–46.0)
Hemoglobin: 6.6 g/dL — CL (ref 12.0–15.0)
MCH: 27.6 pg (ref 26.0–34.0)
MCHC: 33.5 g/dL (ref 30.0–36.0)
MCV: 82.4 fL (ref 80.0–100.0)
Platelets: 78 10*3/uL — ABNORMAL LOW (ref 150–400)
RBC: 2.39 MIL/uL — ABNORMAL LOW (ref 3.87–5.11)
RDW: 18.6 % — ABNORMAL HIGH (ref 11.5–15.5)
WBC: 22.2 10*3/uL — ABNORMAL HIGH (ref 4.0–10.5)
nRBC: 0.1 % (ref 0.0–0.2)

## 2021-11-08 LAB — RENAL FUNCTION PANEL
Albumin: 1.8 g/dL — ABNORMAL LOW (ref 3.5–5.0)
Anion gap: 11 (ref 5–15)
BUN: 33 mg/dL — ABNORMAL HIGH (ref 6–20)
CO2: 14 mmol/L — ABNORMAL LOW (ref 22–32)
Calcium: 7.7 mg/dL — ABNORMAL LOW (ref 8.9–10.3)
Chloride: 109 mmol/L (ref 98–111)
Creatinine, Ser: 3.75 mg/dL — ABNORMAL HIGH (ref 0.44–1.00)
GFR, Estimated: 14 mL/min — ABNORMAL LOW (ref >60)
Glucose, Bld: 165 mg/dL — ABNORMAL HIGH (ref 70–99)
Phosphorus: 2.9 mg/dL (ref 2.5–4.6)
Potassium: 2.3 mmol/L — CL (ref 3.5–5.1)
Sodium: 134 mmol/L — ABNORMAL LOW (ref 135–145)

## 2021-11-08 LAB — GLUCOSE, CAPILLARY
Glucose-Capillary: 134 mg/dL — ABNORMAL HIGH (ref 70–99)
Glucose-Capillary: 123 mg/dL — ABNORMAL HIGH (ref 70–99)

## 2021-11-08 MED ORDER — BIOTENE DRY MOUTH MT LIQD: 15.0000 mL | OROMUCOSAL | Status: DC | PRN

## 2021-11-08 MED ORDER — SODIUM CHLORIDE 0.9 % IV SOLN: 250.0000 mL | INTRAVENOUS | Status: DC | PRN

## 2021-11-08 MED ORDER — GLYCOPYRROLATE 0.2 MG/ML IJ SOLN: 0.2000 mg | INTRAMUSCULAR | Status: DC | PRN

## 2021-11-08 MED ORDER — POLYVINYL ALCOHOL 1.4 % OP SOLN: 1.0000 [drp] | Freq: Four times a day (QID) | OPHTHALMIC | Status: DC | PRN

## 2021-11-08 MED ORDER — SODIUM CHLORIDE 0.9% FLUSH
3.0000 mL | Freq: Two times a day (BID) | INTRAVENOUS | Status: DC
  Administered 2021-11-08 – 2021-11-11 (×7): 3 mL via INTRAVENOUS

## 2021-11-08 MED ORDER — LORAZEPAM 2 MG/ML IJ SOLN
1.0000 mg | Freq: Four times a day (QID) | INTRAMUSCULAR | Status: DC | PRN
  Administered 2021-11-09: 02:00:00 1 mg via INTRAVENOUS
  Filled 2021-11-08: qty 1

## 2021-11-08 MED ORDER — POTASSIUM CHLORIDE 10 MEQ/100ML IV SOLN
10.0000 meq | INTRAVENOUS | Status: AC
  Administered 2021-11-08 (×4): 10 meq via INTRAVENOUS
  Filled 2021-11-08: qty 100

## 2021-11-08 MED ORDER — SODIUM CHLORIDE 0.9% FLUSH: 3.0000 mL | INTRAVENOUS | Status: DC | PRN

## 2021-11-08 MED ORDER — ACETAMINOPHEN 650 MG RE SUPP: 650.0000 mg | Freq: Four times a day (QID) | RECTAL | Status: DC | PRN

## 2021-11-08 MED ORDER — GLYCOPYRROLATE 0.2 MG/ML IJ SOLN
0.2000 mg | INTRAMUSCULAR | Status: DC | PRN
  Administered 2021-11-11: 12:00:00 0.2 mg via INTRAVENOUS
  Filled 2021-11-08: qty 1

## 2021-11-08 MED ORDER — ACETAMINOPHEN 325 MG PO TABS: 650.0000 mg | ORAL_TABLET | Freq: Four times a day (QID) | ORAL | Status: DC | PRN

## 2021-11-08 MED ORDER — POTASSIUM CHLORIDE 20 MEQ PO PACK: 40.0000 meq | PACK | Freq: Once | ORAL | Status: DC

## 2021-11-08 MED ORDER — LORAZEPAM 0.5 MG PO TABS: 0.5000 mg | ORAL_TABLET | ORAL | Status: DC | PRN

## 2021-11-08 MED ORDER — HYDROMORPHONE HCL 1 MG/ML IJ SOLN
0.5000 mg | INTRAMUSCULAR | Status: DC | PRN
  Administered 2021-11-08 – 2021-11-09 (×2): 0.5 mg via INTRAVENOUS
  Filled 2021-11-08 (×2): qty 0.5

## 2021-11-08 MED ORDER — GLYCOPYRROLATE 1 MG PO TABS: 1.0000 mg | ORAL_TABLET | ORAL | Status: DC | PRN

## 2021-11-08 NOTE — Progress Notes (Signed)
Chart reviewed. Renal function worsening. Cr up to ~3.8 with urine output down to around 0.4L. Worsening acidosis. Hgb down to 6.6. Hypokalemic as well. At this junction would recommend 1u prbc at least. Overall, she is not a long term dialysis candidate given underlying comorbidities and lack of baseline functionality (would likely not be able to do HD in a chair in the long term). At this junction, would recommend re-engaging with palliative care and family in regards to [facility]. Per charting yesterday, family was in agreement with no HD. Would hold off on considering CRRT as well. Please call with any questions/concerns.  Gean Quint, MD [facility]

## 2021-11-08 NOTE — Progress Notes (Signed)
Date and time results received: 11/08/21 0603 (use smartphrase ".now" to insert current time)  Test: Potassium Critical Value: 2.3  Name of Provider Notified: Orlin Hilding  Orders Received? Or Actions Taken?:  See new orders

## 2021-11-08 NOTE — Progress Notes (Signed)
PROGRESS NOTE     Courtney Dyer, is a 55 y.o. female, DOB - 06/27/1966, DUP:[id number]  Admit date - 10/29/2021   Admitting Physician Bernadette Hoit, DO  Outpatient Primary MD for the patient is Pcp, No  LOS - 9  No chief complaint on file.     Altered mentation  Brief Narrative:  55 y.o. female with medical history significant for hyperlipidemia, T2DM, gout, GERD , HTN, CKD IV admitted on 10/30/2021 with AKI on CKD stage IV, severe electrolyte derangement, and persistent  severe dehydration partly due to persistent nausea and vomiting with persistent hypotension requiring IV fluids on IV Levophed for pressure support in the setting of sepsis and dehydration  Assessment & Plan:   Principal Problem:   Sepsis secondary to UTI [facility]) Active Problems:   Acute kidney injury ([place])   Hypokalemia   Symptomatic anemia   Hyponatremia   High anion gap metabolic acidosis   Dehydration   Leukocytosis   Microcytic anemia   Hypoalbuminemia due to protein-calorie malnutrition (HCC)   Acute metabolic encephalopathy   Mixed hyperlipidemia   Type 2 diabetes mellitus (HCC)   Gout   Prolonged QT interval   Obesity, Class III, BMI 40-49.9 (morbid obesity) ([place])   1)Persistent Hypotension/hemodynamic instability--- in the setting of dehydration and presumed sepsis -Continues to have low BP and has failed to respond to aggressive management.  -Completed tx with antibiotics; has remained afebrile.  -non-verbal, not following commands, not eating and with steadily worsening renal function. -Long goals of care discussion at bedside; family had decided patient to be DNR/DNI with transition to comfort care management.  -Overall patient is not a candidate for long-term hemodialysis (as discussed with Dr. Candiss Norse and [facility] nephrology). -Blood, urine and GI cultures remain negative to date.  2)AKi on CKD IV with anion gap metabolic acidosis--- suspect ATN --patient with significant dehydration  due to persistent nausea and vomiting, also suspect kidney injury secondary to persistent hypotension -Solitary L kidney, hx/o R nephrectomy;  - CT a/p with dysplastic right kidney and no stones or hydronephrosis on the left.   creatinine was around 10, BUN around 160 -Scr trending up 3.8 currently. -Urine output has dropped significantly and fell to improve by receiving diuretics, IV fluids and albumin.   -Discussion with nephrology service sustained expressing no good candidate for hemodialysis or CVVH at this point. -Family in acceptance and looking for full comfort care and symptomatic management only. -Stopping treatment and blood work moving forward.  3) acute on chronic anemia--- no evidence of ongoing bleeding suspect some component of chronic anemia of CKD -Serum iron is 81 iron saturation 71, ferritin is not low,  -B12 and folate are stable. -Patient has received a total of 2 units of PRBC during this admission. -Last hemoglobin 6.6 on today's blood work; after discussing with family and transitioning to full comfort care only no transfusion will be provided. -ESA treatment at this moment discontinued. -Prior bleeding appreciated.  4)Chronic breast wound--- does not appear infected, Healing left breast periareolar wound--please see ultrasound report from 10/27/2021. -Continue wound care and symptomatic management..  5)Hypokalemia/hyponatremia/hypophosphatemia--electrolyte derangement due to persistent diarrhea and poor oral intake. --No further blood work to follow electrolytes trend anticipated --Patient's care has been transition to full comfort.  6)DM2- A1C 5.2 reflecting excellent diabetic control PTA -Transitioning to full comfort care. -Stopping checking CBGs or providing sliding scale insulin at this point.  7)Social/Ethics--patient's aunt [name] is primary contact and decision maker [phone]) -NOK updated at bedside -  Goals of care discussion decision  has been made for DNR/DNI -Continue treatment otherwise reassess in the next 24-48 hours. -Overall prognosis is is poor and hospital death would not be a surprise. -Palliative care has been consulted. -Further discussion with family member has trigger decision for full comfort care. -Patient is a good candidate for inpatient hospice.  8)Sepsis with Septic Shock--- suspect related to UTI and pneumonia -PCT 0.66 -WBC has trended down appropriately overall. -Culture data and antibiotics as above #1 -Currently off Levophed. -Patient has remained afebrile and completed antibiotic therapy. -Currently transitioning to full comfort/symptomatic management only.  9) Acute metabolic encephalopathy: Multifactorial, secondary to sepsis with septic shock and persistent hypotension, dehydration, worsening renal function and electrolyte and metabolic derangement as mentioned above -Improved with hemodialysis; and subsequently demonstrating further decline. -patient is still no eating/drinking, unable to follow commands and nonverbal. -CT scan demonstrating no acute intracranial normalities; given prolonged episodes of hypotension there is concern for hypoxic injury. -Further discussion with family members has triggered decision for full comfort care with symptomatic management only.  10) Diarrhea-- C. diff and GI pathogen/culture is Negative -Rectal tube placed and IV fluids provided -No further labs anticipated and patient's care is transitioning to full comfort. -diarrhea continue; even slight improvement in quantity appreciated.   11) severe protein calorie malnutrition -In the setting of acute illness and inability to take adequate nutrition/hydration. -Low albumin appreciated in her labs. -Family in agreement not to pursued artificial nutrition and to focus on comfort care.   12-thrombocytopenia: -multifactorial -DVT prophylaxis discontinue at this moment -transitioning to comfort  care.  Disposition/Need for in-Hospital Stay- patient unable to be discharged at this time due to -Persistent hypotension, no eating, no drinking, non-verbal and now with further worsening of renal function and decreased urine output. -Pending discussion with patient's whole family at bedside who agrees at this moment to transition to full comfort care. -good candidate inpatient hospice; TOC will be made aware.   CRITICAL CARE Performed by: Barton Dubois   Total critical care time: 50 minutes  Critical care time was exclusive of separately billable procedures and treating other patients.  Critical care was necessary to treat or prevent imminent or life-threatening deterioration.  Critical care was time spent personally by me on the following activities: development of treatment plan with patient and/or surrogate as well as nursing, discussions with consultants, evaluation of patient's response to treatment, examination of patient, obtaining history from patient or surrogate, ordering and performing treatments and interventions, ordering and review of laboratory studies, ordering and review of radiographic studies, pulse oximetry and re-evaluation of patient's condition.    Status is: Inpatient  Remains inpatient appropriate because: As above  Disposition: The patient is from: Long-term SNF resident              Anticipated d/c is to: Inpatient hospice.   Barriers: Not Clinically Stable-   Code Status :  -  Code Status: DNR transitioning to full comfort care.  Family Communication:   patient's whole family (aunt and cousins) updated at bedside.  Consults  : Nephrology/general surgery/palliative care -case discussed with Dr. Candiss Norse (nephrology) on 11/05/21. -chart reviewed for recommendations by Dr. Arty Baumgartner (nephrology service 11/06/21) -Case discussed with Dr. Marval Regal (nephrology service) 11/07/2021.  DVT Prophylaxis  :   - SCDs ; comfort care.  Lab Results  Component  Value Date   PLT 78 (L) 11/08/2021    Inpatient Medications  Scheduled Meds:  sodium chloride   Intravenous Once   Chlorhexidine  Gluconate Cloth  6 each Topical Daily   sodium chloride flush  3 mL Intravenous Q12H   Zinc Oxide   Topical QID   Continuous Infusions:  sodium chloride Stopped (10/29/21 2251)   sodium chloride     norepinephrine (LEVOPHED) Adult infusion Stopped (11/05/21 1657)   PRN Meds:.sodium chloride, acetaminophen **OR** acetaminophen, alteplase, antiseptic oral rinse, glycopyrrolate **OR** glycopyrrolate **OR** glycopyrrolate, HYDROmorphone (DILAUDID) injection, loperamide, LORazepam **OR** LORazepam, pentafluoroprop-tetrafluoroeth, polyvinyl alcohol, sodium chloride flush   Anti-infectives (From admission, onward)    Start     Dose/Rate Route Frequency Ordered Stop   11/02/21 2000  ceFEPIme (MAXIPIME) 2 g in sodium chloride 0.9 % 100 mL IVPB        2 g 200 mL/hr over 30 Minutes Intravenous Every 24 hours 11/02/21 1316 11/07/21 2040   11/02/21 1800  linezolid (ZYVOX) IVPB 600 mg        600 mg 300 mL/hr over 60 Minutes Intravenous Every 12 hours 11/02/21 1247 11/07/21 2238   10/31/21 1400  vancomycin (VANCOCIN) IVPB 750 mg/150 ml premix        750 mg 150 mL/hr over 60 Minutes Intravenous Every Dialysis 10/31/21 0835 10/31/21 1840   10/30/21 2000  vancomycin (VANCOREADY) IVPB 1500 mg/300 mL  Status:  Discontinued        1,500 mg 150 mL/hr over 120 Minutes Intravenous Every 24 hours 10/29/21 1806 10/29/21 2007   10/30/21 2000  ceFEPIme (MAXIPIME) 1 g in sodium chloride 0.9 % 100 mL IVPB  Status:  Discontinued        1 g 200 mL/hr over 30 Minutes Intravenous Every 24 hours 10/29/21 2009 11/02/21 1316   10/30/21 1600  vancomycin (VANCOREADY) IVPB 750 mg/150 mL        750 mg 150 mL/hr over 60 Minutes Intravenous Every M-W-F (Hemodialysis) 10/30/21 1146 10/30/21 2006   10/30/21 1136  vancomycin variable dose per unstable renal function (pharmacist dosing)  Status:   Discontinued         Does not apply See admin instructions 10/30/21 1137 11/02/21 1247   10/30/21 0400  ceFEPIme (MAXIPIME) 2 g in sodium chloride 0.9 % 100 mL IVPB  Status:  Discontinued        2 g 200 mL/hr over 30 Minutes Intravenous Every 8 hours 10/29/21 1806 10/29/21 2007   10/29/21 2015  ceFEPIme (MAXIPIME) 1 g in sodium chloride 0.9 % 100 mL IVPB  Status:  Discontinued        1 g 200 mL/hr over 30 Minutes Intravenous Every 24 hours 10/29/21 2007 10/29/21 2009   10/29/21 1800  ceFEPIme (MAXIPIME) 2 g in sodium chloride 0.9 % 100 mL IVPB        2 g 200 mL/hr over 30 Minutes Intravenous  Once 10/29/21 1747 10/29/21 1852   10/29/21 1800  metroNIDAZOLE (FLAGYL) IVPB 500 mg        500 mg 100 mL/hr over 60 Minutes Intravenous  Once 10/29/21 1747 10/29/21 2004   10/29/21 1800  vancomycin (VANCOCIN) IVPB 1000 mg/200 mL premix        1,000 mg 200 mL/hr over 60 Minutes Intravenous  Once 10/29/21 1747 10/29/21 2149   10/29/21 1800  vancomycin (VANCOCIN) IVPB 1000 mg/200 mL premix        1,000 mg 200 mL/hr over 60 Minutes Intravenous  Once 10/29/21 1754 10/29/21 2100       Subjective: Candice Camp afebrile, no nausea, no vomiting.  Continues to have diarrhea.  Of Levophed now for 72 hours  with soft blood a stable blood pressure.  Condition has continued to decline now demonstrated increase in creatinine along with decreased urine output.  Patient remains unable to follow any commands, no eating, not drinking, nonverbal.  Objective: Vitals:   11/08/21 1100 11/08/21 1200 11/08/21 1300 11/08/21 1400  BP: 103/66 109/81 125/78 110/68  Pulse: 79 79 77 84  Resp: 13 15 11 13   Temp:      TempSrc:      SpO2: 100% 100% 100% 100%  Weight:      Height:        Intake/Output Summary (Last 24 hours) at 11/08/2021 1437 Last data filed at 11/08/2021 3244 Gross per 24 hour  Intake 2915.16 ml  Output 320 ml  Net 2595.16 ml   Filed Weights   11/03/21 0443 11/04/21 0516 11/08/21 0645   Weight: 85.4 kg 89.3 kg 90.7 kg    Physical Exam General exam: Unable to follow commands, no eating, drinking, continues to experience diarrhea.  Patient is afebrile. Respiratory system: Decreased breath sounds at the bases; no wheezing, no frank crackles.  No using accessory muscles.  Good saturation on room air. Cardiovascular system:RRR. No murmurs, rubs, gallops.  Unable to properly assess JVD with body habitus. Gastrointestinal system: Abdomen is obese, nondistended, soft and nontender. No organomegaly or masses felt. Normal bowel sounds heard. Central nervous system: Unable to properly assess with current ongoing encephalopathic process. Extremities: No cyanosis or clubbing; 1+ edema appreciated bilaterally (lower extremities. Skin: No petechiae.  Left IJ in place; breakdown of skin appreciated in her posterior/inner thighs/buttocks secondary to ongoing fecal incontinence Psychiatry: Unable to properly assess secondary to ongoing encephalopathic process.  Data Reviewed: I have personally reviewed following labs and imaging studies  CBC: Recent Labs  Lab 11/03/21 0430 11/04/21 0357 11/05/21 0935 11/06/21 0431 11/06/21 1110 11/08/21 0527  WBC 15.4* 14.8* 21.9* 19.5*  --  22.2*  NEUTROABS  --   --  17.6*  --   --   --   HGB 7.9* 7.2* 7.1* 7.3*  --  6.6*  HCT 23.7* 22.7* 22.1* 22.3*  --  19.7*  MCV 82.0 85.0 84.4 83.8  --  82.4  PLT 99* 89* 85* 90* 107* 78*   Basic Metabolic Panel: Recent Labs  Lab 11/03/21 0430 11/04/21 0357 11/05/21 0935 11/06/21 0431 11/07/21 0503 11/08/21 0527  NA 134* 134* 136 135 139 134*  K 3.3* 4.6 3.2* 2.9* 2.6* 2.3*  CL 105 107 111 109 113* 109  CO2 20* 20* 16* 17* 17* 14*  GLUCOSE 139* 125* 119* 169* 198* 165*  BUN 32* 30* 28* 27* 30* 33*  CREATININE 3.51* 3.40* 3.34* 3.41* 3.52* 3.75*  CALCIUM 8.1* 8.2* 7.8* 7.8* 8.2* 7.7*  PHOS 3.2 3.0 2.2*  --  2.8 2.9   GFR: Estimated Creatinine Clearance: 18.9 mL/min (A) (by C-G formula based on  SCr of 3.75 mg/dL (H)).  Liver Function Tests: Recent Labs  Lab 11/03/21 0430 11/04/21 0357 11/05/21 0935 11/07/21 0503 11/08/21 0527  ALBUMIN 1.6* 1.9* 1.6* 1.6* 1.8*   Coagulation Profile: Recent Labs  Lab 11/06/21 1110  INR 1.8*    CBG: Recent Labs  Lab 11/07/21 1205 11/07/21 1618 11/07/21 2127 11/08/21 0728 11/08/21 1103  GLUCAP 189* 161* 138* 134* 123*   Urine analysis:    Component Value Date/Time   COLORURINE GREEN (A) 10/29/2021 2005   APPEARANCEUR CLOUDY (A) 10/29/2021 2005   LABSPEC 1.020 10/29/2021 2005   PHURINE 6.0 10/29/2021 2005   [place] NEGATIVE 10/29/2021 2005  HGBUR SMALL (A) 10/29/2021 2005   BILIRUBINUR NEGATIVE 10/29/2021 2005   KETONESUR 20 (A) 10/29/2021 2005   PROTEINUR 100 (A) 10/29/2021 2005   NITRITE POSITIVE (A) 10/29/2021 2005   LEUKOCYTESUR LARGE (A) 10/29/2021 2005   Sepsis Labs:  Recent Results (from the past 240 hour(s))  Resp Panel by RT-PCR (Flu A&B, Covid) Nasopharyngeal Swab     Status: None   Collection Time: 10/29/21  5:27 PM   Specimen: Nasopharyngeal Swab; Nasopharyngeal(NP) swabs in vial transport medium  Result Value Ref Range Status   SARS Coronavirus 2 by RT PCR NEGATIVE NEGATIVE Final    Comment: (NOTE) SARS-CoV-2 target nucleic acids are NOT DETECTED.  The SARS-CoV-2 RNA is generally detectable in upper respiratory specimens during the acute phase of infection. The lowest concentration of SARS-CoV-2 viral copies this assay can detect is 138 copies/mL. A negative result does not preclude SARS-Cov-2 infection and should not be used as the sole basis for treatment or other patient management decisions. A negative result may occur with  improper specimen collection/handling, submission of specimen other than nasopharyngeal swab, presence of viral mutation(s) within the areas targeted by this assay, and inadequate number of viral copies(<138 copies/mL). A negative result must be combined with clinical  observations, patient history, and epidemiological information. The expected result is Negative.  Fact Sheet for Patients:  EntrepreneurPulse.com.au  Fact Sheet for Healthcare Providers:  IncredibleEmployment.be  This test is no t yet approved or cleared by the [place] FDA and  has been authorized for detection and/or diagnosis of SARS-CoV-2 by FDA under an Emergency Use Authorization (EUA). This EUA will remain  in effect (meaning this test can be used) for the duration of the COVID-19 declaration under Section 564(b)(1) of the Act, 21 U.S.C.section 360bbb-3(b)(1), unless the authorization is terminated  or revoked sooner.       Influenza A by PCR NEGATIVE NEGATIVE Final   Influenza B by PCR NEGATIVE NEGATIVE Final    Comment: (NOTE) The Xpert Xpress SARS-CoV-2/FLU/RSV plus assay is intended as an aid in the diagnosis of influenza from Nasopharyngeal swab specimens and should not be used as a sole basis for treatment. Nasal washings and aspirates are unacceptable for Xpert Xpress SARS-CoV-2/FLU/RSV testing.  Fact Sheet for Patients: EntrepreneurPulse.com.au  Fact Sheet for Healthcare Providers: IncredibleEmployment.be  This test is not yet approved or cleared by the [place] FDA and has been authorized for detection and/or diagnosis of SARS-CoV-2 by FDA under an Emergency Use Authorization (EUA). This EUA will remain in effect (meaning this test can be used) for the duration of the COVID-19 declaration under Section 564(b)(1) of the Act, 21 U.S.C. section 360bbb-3(b)(1), unless the authorization is terminated or revoked.  Performed at [facility], [address], [place], [place] [address]   Culture, blood (routine x 2)     Status: None   Collection Time: 10/29/21  5:51 PM   Specimen: BLOOD  Result Value Ref Range Status   Specimen Description BLOOD BLOOD RIGHT ARM  Final   Special  Requests   Final    BOTTLES DRAWN AEROBIC AND ANAEROBIC Blood Culture adequate volume   Culture   Final    NO GROWTH 6 DAYS Performed at [facility], [address] [place], [place] [address]    Report Status 11/04/2021 FINAL  Final  Culture, blood (routine x 2)     Status: None   Collection Time: 10/29/21  9:19 PM   Specimen: BLOOD  Result Value Ref Range Status  Specimen Description BLOOD central line  Final   Special Requests   Final    BOTTLES DRAWN AEROBIC AND ANAEROBIC Blood Culture adequate volume   Culture   Final    NO GROWTH 6 DAYS Performed at [facility], [address], [place], [place] [address]    Report Status 11/04/2021 FINAL  Final  MRSA Next Gen by PCR, Nasal     Status: Abnormal   Collection Time: 10/30/21  1:52 AM   Specimen: Nasal Mucosa; Nasal Swab  Result Value Ref Range Status   MRSA by PCR Next Gen DETECTED (A) NOT DETECTED Final    Comment: RESULT CALLED TO, READ BACK BY AND VERIFIED WITH: ASHLEY @ 1055 ON [id number] BY HENDERSON L (NOTE) The GeneXpert MRSA Assay (FDA approved for NASAL specimens only), is one component of a comprehensive MRSA colonization surveillance program. It is not intended to diagnose MRSA infection nor to guide or monitor treatment for MRSA infections. Test performance is not FDA approved in patients less than 64 years old. Performed at [facility], [address] [place], [place] [address]   Gastrointestinal Panel by PCR , Stool     Status: None   Collection Time: 10/30/21  5:35 PM   Specimen: Rectum; Stool  Result Value Ref Range Status   Campylobacter species NOT DETECTED NOT DETECTED Final   Plesimonas shigelloides NOT DETECTED NOT DETECTED Final   Salmonella species NOT DETECTED NOT DETECTED Final   Yersinia enterocolitica NOT DETECTED NOT DETECTED Final   Vibrio species NOT DETECTED NOT DETECTED Final   Vibrio cholerae NOT DETECTED NOT DETECTED Final   Enteroaggregative E coli (EAEC) NOT DETECTED NOT DETECTED Final    Enteropathogenic E coli (EPEC) NOT DETECTED NOT DETECTED Final   Enterotoxigenic E coli (ETEC) NOT DETECTED NOT DETECTED Final   Shiga like toxin producing E coli (STEC) NOT DETECTED NOT DETECTED Final   Shigella/Enteroinvasive E coli (EIEC) NOT DETECTED NOT DETECTED Final   Cryptosporidium NOT DETECTED NOT DETECTED Final   Cyclospora cayetanensis NOT DETECTED NOT DETECTED Final   Entamoeba histolytica NOT DETECTED NOT DETECTED Final   Giardia lamblia NOT DETECTED NOT DETECTED Final   Adenovirus F40/41 NOT DETECTED NOT DETECTED Final   Astrovirus NOT DETECTED NOT DETECTED Final   Norovirus GI/GII NOT DETECTED NOT DETECTED Final   Rotavirus A NOT DETECTED NOT DETECTED Final   Sapovirus (I, II, IV, and V) NOT DETECTED NOT DETECTED Final    Comment: Performed at [facility], [address] [place], [place] [address]  C Difficile Quick Screen w PCR reflex     Status: None   Collection Time: 10/30/21  5:35 PM   Specimen: Rectum; Stool  Result Value Ref Range Status   C Diff antigen NEGATIVE NEGATIVE Final   C Diff toxin NEGATIVE NEGATIVE Final   C Diff interpretation No C. difficile detected.  Final    Comment: Performed at [facility], [address] [place], [place] [address]  Urine Culture     Status: None   Collection Time: 11/01/21  8:20 AM   Specimen: Urine, Catheterized  Result Value Ref Range Status   Specimen Description   Final    URINE, CATHETERIZED Performed at [facility], [address] [place], [place] [address]    Special Requests   Final    NONE Performed at [facility], [address], [place], [place] [address]    Culture   Final    NO GROWTH Performed at [facility],  [address] [place], [place] [address]    Report Status 11/02/2021 FINAL  Final    Radiology Studies: No results found.   Scheduled Meds:  sodium chloride   Intravenous Once   Chlorhexidine Gluconate Cloth  6 each Topical Daily   sodium chloride flush  3 mL  Intravenous Q12H   Zinc Oxide   Topical QID   Continuous Infusions:  sodium chloride Stopped (10/29/21 2251)   sodium chloride     norepinephrine (LEVOPHED) Adult infusion Stopped (11/05/21 1657)    LOS: 9 days   Barton Dubois M.D on 11/08/2021 at 2:37 PM  Go to [email or web address] - for contact info  [facility] - Office  [phone]  If 7PM-7AM, please contact night-coverage [email or web address] Password [facility] 11/08/2021, 2:37 PM

## 2021-11-08 NOTE — Progress Notes (Signed)
Per previous report, patient has not had much of an appetite or taking anything by mouth the past few days. Made patient's diet "comfort food, floor stock," option. Pt's family was able to encourage her to drink a whole can of soda, eat a few bites of ice cream, and eat some ice chips. Will continue to monitor and offer foods and fluids.

## 2021-11-08 NOTE — Progress Notes (Signed)
Report called and given to [facility], [place]. Pt to be transported to room 320 via bed.

## 2021-11-09 ENCOUNTER — Encounter (HOSPITAL_COMMUNITY): Payer: Self-pay | Admitting: Internal Medicine

## 2021-11-09 DIAGNOSIS — Z66 Do not resuscitate: Secondary | ICD-10-CM

## 2021-11-09 DIAGNOSIS — Z515 Encounter for palliative care: Secondary | ICD-10-CM

## 2021-11-09 DIAGNOSIS — E871 Hypo-osmolality and hyponatremia: Secondary | ICD-10-CM

## 2021-11-09 DIAGNOSIS — N1832 Chronic kidney disease, stage 3b: Secondary | ICD-10-CM

## 2021-11-09 DIAGNOSIS — E876 Hypokalemia: Secondary | ICD-10-CM

## 2021-11-09 MED ORDER — HYDROMORPHONE HCL 1 MG/ML IJ SOLN
1.0000 mg | INTRAMUSCULAR | Status: DC | PRN
  Administered 2021-11-09 – 2021-11-11 (×12): 1 mg via INTRAVENOUS
  Filled 2021-11-09 (×12): qty 1

## 2021-11-09 MED ORDER — HYDROMORPHONE HCL 1 MG/ML IJ SOLN
1.0000 mg | INTRAMUSCULAR | Status: DC | PRN
  Administered 2021-11-09: 09:00:00 1 mg via INTRAVENOUS
  Filled 2021-11-09: qty 1

## 2021-11-09 NOTE — Care Management Important Message (Signed)
Important Message  Patient Details  Name: Courtney Dyer MRN: [id number] Date of Birth: Nov 22, 1965   Medicare Important Message Given:  Other (see comment)  On comfort care measures with disposition for hospice services.  Medicare IM withheld at this time out of respect for patient and family.     Dannette Barbara 11/09/2021, 4:56 PM

## 2021-11-09 NOTE — Progress Notes (Addendum)
While making rounds this LPN noticed the pt was soiled, and got assistance from the nursing staff. Pt was cleaned and ADL'S addressed. Pt was in extreme pain and discomfort, and yelled out. This LPN noticed on the back of pt's right leg open blistered areas with skin missing, open area in the pt's groin and buttocks and inner left thigh also had open, bleeding areas. When removing the soiled dressings the pt' skin slough off  the PRN  HYDROmorphone (DILAUDID) injection 0.5 mg    Was administered, with no relief. RN came in and assisted in the dressing of areas and administered LORazepam (ATIVAN) injection 1 mg     To help the pt with the anxiety and distress. Will continue to monitor. Zinc Oxide 40 % PSTE    Applied to areas, and folds

## 2021-11-09 NOTE — Consult Note (Signed)
Consultation Note Date: 11/09/2021   Patient Name: Courtney Dyer  DOB: 25-Nov-1965  MRN: [id number]  Age / Sex: 55 y.o., female  PCP: Pcp, No Referring Physician: Barton Dubois, MD  Reason for Consultation: Establishing goals of care  HPI/Patient Profile: 55 y.o. female  with past medical history of  HTN/HLD, DM2, gout, GERD, CKD IV admitted on 10/29/2021 with sepsis secondary to UTI.   Clinical Assessment and Goals of Care: I have reviewed medical records including EPIC notes, labs and imaging, received report from RN, assessed the patient.  Courtney Dyer is lying quietly in bed.  Her eyes are open, but she does not interact with me in any meaningful way.  There is no family at bedside at this time.    Conference with transition of care team he shares that family is electing comfort care.   Advanced directives, concepts specific to code status, were considered and discussed with family by attending.  DNR in place.  [place] form completed and placed on chart.  Hospice services were explained and offered to family/healthcare surrogate by attending.  Family has elected comfort and dignity at end-of-life, residential hospice at [facility].  Transition of care team is working to assist with placement.  Conference with attending, bedside nursing staff, transition of care team related to patient condition, needs, goals of care, disposition.   HCPOA  NEXT OF KIN - Courtney Dyer.     SUMMARY OF RECOMMENDATIONS   Family is requesting comfort and dignity at end-of-life, residential hospice with [facility]   Code Status/Advance Care Planning: DNR -DNR already in place from attending.  Goldenrod form completed and placed on chart.  Symptom Management:  End-of-life order set implemented  Palliative Prophylaxis:  Frequent Pain Assessment, Palliative Wound Care, and Turn Reposition  Additional  Recommendations (Limitations, Scope, Preferences): Full Comfort Care  Psycho-social/Spiritual:  Desire for further Chaplaincy support:no Additional Recommendations: Caregiving  Support/Resources and Education on Hospice  Prognosis:  < 2 weeks  Discharge Planning:  Requesting residential hospice at [facility]       Primary Diagnoses: Present on Admission:  Sepsis secondary to UTI [facility])  Hypokalemia   I have reviewed the medical record, interviewed the patient and family, and examined the patient. The following aspects are pertinent.  Past Medical History:  Diagnosis Date   Diabetes mellitus    Gout    High cholesterol    Social History   Socioeconomic History   Marital status: Widowed    Spouse name: Not on file   Number of children: Not on file   Years of education: Not on file   Highest education level: Not on file  Occupational History   Not on file  Tobacco Use   Smoking status: Every Day    Packs/day: 0.50    Types: Cigarettes   Smokeless tobacco: Never  Substance and Sexual Activity   Alcohol use: Yes    Comment: daily   Drug use: No   Sexual activity: Yes    Birth control/protection: None  Other Topics Concern   Not on file  Social History Narrative   Not on file   Social Determinants of Health   Financial Resource Strain: Not on file  Food Insecurity: Not on file  Transportation Needs: Not on file  Physical Activity: Not on file  Stress: Not on file  Social Connections: Not on file   Family History  Problem Relation Age of Onset   Hypertension Maternal Courtney    Scheduled Meds:  sodium chloride   Intravenous Once   Chlorhexidine Gluconate Cloth  6 each Topical Daily   sodium chloride flush  3 mL Intravenous Q12H   Zinc Oxide   Topical QID   Continuous Infusions:  sodium chloride Stopped (10/29/21 2251)   sodium chloride     PRN Meds:.sodium chloride, acetaminophen **OR** acetaminophen, alteplase, antiseptic oral rinse, glycopyrrolate  **OR** glycopyrrolate **OR** glycopyrrolate, HYDROmorphone (DILAUDID) injection, loperamide, LORazepam **OR** LORazepam, pentafluoroprop-tetrafluoroeth, polyvinyl alcohol, sodium chloride flush Medications Prior to Admission:  Prior to Admission medications   Medication Sig Start Date End Date Taking? Authorizing Provider  folic acid (FOLVITE) 1 MG tablet Take 1 tablet (1 mg total) by mouth daily. 08/10/21  Yes Tat, Shanon Brow, MD  magnesium oxide (MAG-OX) 400 (240 Mg) MG tablet Take 1 tablet (400 mg total) by mouth daily. 08/09/21  Yes Tat, Shanon Brow, MD  Multiple Vitamin (MULTIVITAMIN WITH MINERALS) TABS tablet Take 1 tablet by mouth daily. 09/02/21  Yes Johnson, Clanford L, MD  Nutritional Supplements (,FEEDING SUPPLEMENT, PROSOURCE PLUS) liquid Take 30 mLs by mouth 3 (three) times daily between meals. 09/01/21  Yes Johnson, Clanford L, MD  ondansetron (ZOFRAN) 4 MG tablet Take 1 tablet (4 mg total) by mouth every 6 (six) hours as needed for nausea or vomiting. 09/01/21  Yes Johnson, Clanford L, MD  pantoprazole (PROTONIX) 40 MG tablet Take 1 tablet (40 mg total) by mouth 2 (two) times daily before a meal. 09/01/21 11/30/21 Yes Johnson, Clanford L, MD  sucralfate (CARAFATE) 1 g tablet Take 1 tablet (1 g total) by mouth 4 (four) times daily -  with meals and at bedtime for 14 days. 09/01/21 10/29/21 Yes Johnson, Clanford L, MD  thiamine 100 MG tablet Take 1 tablet (100 mg total) by mouth daily. 09/02/21  Yes Johnson, Clanford L, MD  traMADol (ULTRAM) 50 MG tablet Take 50 mg by mouth every 6 (six) hours as needed.   Yes [provider]  nystatin (MYCOSTATIN/NYSTOP) powder Apply topically 3 (three) times daily. Patient not taking: Reported on 11/02/2021 09/01/21   Murlean Iba, MD   No Known Allergies Review of Systems  Unable to perform ROS: Patient nonverbal   Physical Exam Vitals and nursing note reviewed.  Constitutional:      Comments: Does not respond to voice or touch in any  meaningful way.  Cardiovascular:     Rate and Rhythm: Normal rate.  Pulmonary:     Effort: Pulmonary effort is normal. No respiratory distress.  Neurological:     Mental Status: She is alert.    Vital Signs: BP (!) 113/91 (BP Location: Right Arm)    Pulse 80    Temp 98 F (36.7 C)    Resp 18    Ht 5\' 4"  (1.626 m)    Wt 90.7 kg    LMP 09/26/2015    SpO2 93%    BMI 34.32 kg/m  Pain Scale: Faces   Pain Score: Asleep   SpO2: SpO2: 93 % O2 Device:SpO2: 93 % O2 Flow Rate: .O2 Flow  Rate (L/min): 0 L/min  IO: Intake/output summary:  Intake/Output Summary (Last 24 hours) at 11/09/2021 1209 Last data filed at 11/09/2021 0700 Gross per 24 hour  Intake 2199.47 ml  Output 800 ml  Net 1399.47 ml    LBM: Last BM Date: 11/08/21 Baseline Weight: Weight: 109 kg Most recent weight: Weight: 90.7 kg     Palliative Assessment/Data:   Flowsheet Rows    Flowsheet Row Most Recent Value  Intake Tab   Referral Department Hospitalist  Unit at Time of Referral Med/Surg Unit  Palliative Care Primary Diagnosis Neurology  Date Notified 11/04/21  Palliative Care Type New Palliative care  Reason for referral Clarify Goals of Care, End of Life Care Assistance  Date of Admission 10/29/21  Date first seen by Palliative Care 11/09/21  # of days Palliative referral response time 5 Day(s)  # of days IP prior to Palliative referral 6  Clinical Assessment   Palliative Performance Scale Score 20%  Pain Max last 24 hours Not able to report  Pain Min Last 24 hours Not able to report  Dyspnea Max Last 24 Hours Not able to report  Dyspnea Min Last 24 hours Not able to report  Psychosocial & Spiritual Assessment   Palliative Care Outcomes        Time In: 0910 Time Out: 1020 Time Total: 70 minutes  Greater than 50%  of this time was spent counseling and coordinating care related to the above assessment and plan.  Signed by: Drue Novel, NP   Please contact Palliative Medicine Team phone at  [phone] for questions and concerns.  For individual provider: See Shea Evans

## 2021-11-09 NOTE — Progress Notes (Signed)
Patient ID: Courtney Dyer, female   DOB: 06-17-1966, 55 y.o.   MRN: [id number] The patient's chart was reviewed and it is noted that the trajectory of care is heading towards comfort measures only/inpatient hospice.  Previous nephrology notes reviewed-the patient is not suitable for long-term hemodialysis due to significant functional impairment at baseline. Nephrology service will sign off at this time and remain available for questions or concerns.  Elmarie Shiley MD [facility]. Office # [phone] Pager # [phone] 10:05 AM

## 2021-11-09 NOTE — TOC Progression Note (Signed)
Transition of Care [facility]) - Progression Note    Patient Details  Name: Courtney Dyer MRN: [id number] Date of Birth: Jun 13, 1966  Transition of Care [facility]) CM/SW Contact  Salome Arnt, Sharon Phone Number: 11/09/2021, 10:51 AM  Clinical Narrative:  LCSW spoke with pt's aunt, Hoyle Sauer who reports she is next of kind. Hoyle Sauer is agreeable to refer to residential hospice at [facility]. Referral made. However, they are closed today due to holiday and will review tomorrow. Hoyle Sauer and MD updated. TOC will follow up in AM.      Expected Discharge Plan: Skilled Nursing Facility Barriers to Discharge: Other (must enter comment) (Hospice closed due to holiday)  Expected Discharge Plan and Services Expected Discharge Plan: [place] arrangements for the past 2 months: Skilled Nursing Facility                                       Social Determinants of Health (SDOH) Interventions    Readmission Risk Interventions Readmission Risk Prevention Plan 10/30/2021 08/27/2021 04/27/2021  Medication Screening - - Complete  Transportation Screening Complete Complete Complete  Home Care Screening - Complete -  Medication Review (RN CM) - Complete -  Medication Review (RN Care Manager) Complete - -  PCP or Specialist appointment within 3-5 days of discharge Complete - -  [place] or Home Care Consult Complete - -  SW Recovery Care/Counseling Consult Complete - -  Palliative Care Screening Not Complete - -  Skilled Nursing Facility Complete - -  Some recent data might be hidden

## 2021-11-09 NOTE — Progress Notes (Signed)
PROGRESS NOTE     Courtney Dyer, is a 55 y.o. female, DOB - 01-03-1966, CNO:[id number]  Admit date - 10/29/2021   Admitting Physician Bernadette Hoit, DO  Outpatient Primary MD for the patient is Pcp, No  LOS - 10  No chief complaint on file.     Altered mentation  Brief Narrative:  55 y.o. female with medical history significant for hyperlipidemia, T2DM, gout, GERD , HTN, CKD IV admitted on 10/30/2021 with AKI on CKD stage IV, severe electrolyte derangement, and persistent  severe dehydration partly due to persistent nausea and vomiting with persistent hypotension requiring IV fluids on IV Levophed for pressure support in the setting of sepsis and dehydration  Assessment & Plan:   Principal Problem:   Sepsis secondary to UTI [facility]) Active Problems:   Acute kidney injury ([place])   Hypokalemia   Symptomatic anemia   Hyponatremia   High anion gap metabolic acidosis   Dehydration   Leukocytosis   Microcytic anemia   Hypoalbuminemia due to protein-calorie malnutrition (HCC)   Acute metabolic encephalopathy   Mixed hyperlipidemia   Type 2 diabetes mellitus (HCC)   Gout   Prolonged QT interval   Obesity, Class III, BMI 40-49.9 (morbid obesity) ([place])   1)Persistent Hypotension/hemodynamic instability--- in the setting of dehydration and presumed sepsis -Continues to have low BP and has failed to respond to aggressive management.  -Completed tx with antibiotics; has remained afebrile.  -non-verbal, not following commands, not eating and with steadily worsening renal function. -Long goals of care discussion at bedside; family had decided patient to be DNR/DNI with transition to comfort care management.  -Overall patient is not a candidate for long-term hemodialysis (as discussed with Dr. Candiss Norse and [facility] nephrology). -Blood, urine and GI cultures remain negative to date.  2)AKi on CKD IV with anion gap metabolic acidosis--- suspect ATN --patient with significant  dehydration due to persistent nausea and vomiting, also suspect kidney injury secondary to persistent hypotension -Solitary L kidney, hx/o R nephrectomy;  - CT a/p with dysplastic right kidney and no stones or hydronephrosis on the left.   creatinine was around 10, BUN around 160 -Scr trending up 3.8 currently. -Urine output has dropped significantly and fell to improve by receiving diuretics, IV fluids and albumin.   -Discussion with nephrology service sustained expressing no good candidate for hemodialysis or CVVH at this point. -Family in acceptance and looking for full comfort care and symptomatic management only. -Stopping treatment and blood work moving forward.  3) acute on chronic anemia--- no evidence of ongoing bleeding suspect some component of chronic anemia of CKD -Serum iron is 81 iron saturation 71, ferritin is not low,  -B12 and folate are stable. -Patient has received a total of 2 units of PRBC during this admission. -Last hemoglobin 6.6 on 12/25 blood work; after discussing with family and transitioning to full comfort care only no transfusion will be provided. -ESA treatment at this moment discontinued. -Prior bleeding appreciated. -no further blood drawn planned; continue comfort care.  4)Chronic breast wound--- does not appear infected, Healing left breast periareolar wound--please see ultrasound report from 10/27/2021. -Continue wound care and symptomatic management..  5)Hypokalemia/hyponatremia/hypophosphatemia--electrolyte derangement due to persistent diarrhea and poor oral intake. --No further blood work to follow electrolytes trend anticipated --Patient's care has been transition to full comfort.  6)DM2- A1C 5.2 reflecting excellent diabetic control PTA -Transitioning to full comfort care. -Stopping checking CBGs or providing sliding scale insulin at this point.  7)Social/Ethics--patient's aunt [name] is primary contact  and decision maker  [phone]) -NOK updated at bedside -Goals of care discussion decision has been made for DNR/DNI -Overall prognosis is is poor and hospital death would not be a surprise. -Palliative care has been consulted and will follow any further recommendations for symptomatic care.. -Further discussion with family member has trigger decision for full comfort care. -Patient is a good candidate for inpatient hospice.  8)Sepsis with Septic Shock--- suspect related to UTI and pneumonia -PCT 0.66 -WBC has trended down appropriately overall. -Culture data and antibiotics as above #1 -Currently off Levophed. -Patient has remained afebrile and completed antibiotic therapy. -Currently transitioning to full comfort/symptomatic management only.  9) Acute metabolic encephalopathy: Multifactorial, secondary to sepsis with septic shock and persistent hypotension, dehydration, worsening renal function and electrolyte and metabolic derangement as mentioned above -Improved with hemodialysis; and subsequently demonstrating further decline. -patient is still no eating/drinking, unable to follow commands and nonverbal. -CT scan demonstrating no acute intracranial normalities; given prolonged episodes of hypotension there is concern for hypoxic injury. -Further discussion with family members has triggered decision for full comfort care with symptomatic management only.  10) Diarrhea-- C. diff and GI pathogen/culture is Negative -Rectal tube placed and IV fluids provided -No further labs anticipated and patient's care has been transitioned to full comfort. -diarrhea continue; even slight improvement in quantity reported. Marland Kitchen   11) severe protein calorie malnutrition -In the setting of acute illness and inability to take adequate nutrition/hydration. -Low albumin appreciated in her labs. -Family in agreement not to pursued artificial nutrition and to focus on comfort care.    12-thrombocytopenia: -multifactorial -DVT prophylaxis discontinue at this moment -continue full comfort care and symptomatic management.  Disposition/Need for in-Hospital Stay- patient unable to be discharged at this time due to -Persistent hypotension, no eating, no drinking, non-verbal and now with further worsening of renal function and decreased urine output. -Pending discussion with patient's whole family at bedside who agrees at this moment to transition to full comfort care. -good candidate inpatient hospice; [facility] made aware. Hospice not available today due to festivity.  Status is: Inpatient  Remains inpatient appropriate because: As above  Disposition: The patient is from: Long-term SNF resident              Anticipated d/c is to: Inpatient hospice.   Barriers: Not Clinically Stable-   Code Status :  -  Code Status: DNR transitioning to full comfort care.  Family Communication:   patient's whole family (aunt and cousins) updated at bedside.  Consults  : Nephrology/general surgery/palliative care -case discussed with Dr. Candiss Norse (nephrology) on 11/05/21. -chart reviewed for recommendations by Dr. Arty Baumgartner (nephrology service 11/06/21) -Case discussed with Dr. Marval Regal (nephrology service) 11/07/2021.  DVT Prophylaxis  :   - SCDs ; comfort care.  Lab Results  Component Value Date   PLT 78 (L) 11/08/2021    Inpatient Medications  Scheduled Meds:  sodium chloride   Intravenous Once   Chlorhexidine Gluconate Cloth  6 each Topical Daily   sodium chloride flush  3 mL Intravenous Q12H   Zinc Oxide   Topical QID   Continuous Infusions:  sodium chloride Stopped (10/29/21 2251)   sodium chloride     PRN Meds:.sodium chloride, acetaminophen **OR** acetaminophen, alteplase, antiseptic oral rinse, glycopyrrolate **OR** glycopyrrolate **OR** glycopyrrolate, HYDROmorphone (DILAUDID) injection, loperamide, LORazepam **OR** LORazepam, pentafluoroprop-tetrafluoroeth,  polyvinyl alcohol, sodium chloride flush   Anti-infectives (From admission, onward)    Start     Dose/Rate Route Frequency Ordered Stop   11/02/21 2000  ceFEPIme (MAXIPIME) 2 g in sodium chloride 0.9 % 100 mL IVPB        2 g 200 mL/hr over 30 Minutes Intravenous Every 24 hours 11/02/21 1316 11/07/21 2040   11/02/21 1800  linezolid (ZYVOX) IVPB 600 mg        600 mg 300 mL/hr over 60 Minutes Intravenous Every 12 hours 11/02/21 1247 11/07/21 2238   10/31/21 1400  vancomycin (VANCOCIN) IVPB 750 mg/150 ml premix        750 mg 150 mL/hr over 60 Minutes Intravenous Every Dialysis 10/31/21 0835 10/31/21 1840   10/30/21 2000  vancomycin (VANCOREADY) IVPB 1500 mg/300 mL  Status:  Discontinued        1,500 mg 150 mL/hr over 120 Minutes Intravenous Every 24 hours 10/29/21 1806 10/29/21 2007   10/30/21 2000  ceFEPIme (MAXIPIME) 1 g in sodium chloride 0.9 % 100 mL IVPB  Status:  Discontinued        1 g 200 mL/hr over 30 Minutes Intravenous Every 24 hours 10/29/21 2009 11/02/21 1316   10/30/21 1600  vancomycin (VANCOREADY) IVPB 750 mg/150 mL        750 mg 150 mL/hr over 60 Minutes Intravenous Every M-W-F (Hemodialysis) 10/30/21 1146 10/30/21 2006   10/30/21 1136  vancomycin variable dose per unstable renal function (pharmacist dosing)  Status:  Discontinued         Does not apply See admin instructions 10/30/21 1137 11/02/21 1247   10/30/21 0400  ceFEPIme (MAXIPIME) 2 g in sodium chloride 0.9 % 100 mL IVPB  Status:  Discontinued        2 g 200 mL/hr over 30 Minutes Intravenous Every 8 hours 10/29/21 1806 10/29/21 2007   10/29/21 2015  ceFEPIme (MAXIPIME) 1 g in sodium chloride 0.9 % 100 mL IVPB  Status:  Discontinued        1 g 200 mL/hr over 30 Minutes Intravenous Every 24 hours 10/29/21 2007 10/29/21 2009   10/29/21 1800  ceFEPIme (MAXIPIME) 2 g in sodium chloride 0.9 % 100 mL IVPB        2 g 200 mL/hr over 30 Minutes Intravenous  Once 10/29/21 1747 10/29/21 1852   10/29/21 1800  metroNIDAZOLE  (FLAGYL) IVPB 500 mg        500 mg 100 mL/hr over 60 Minutes Intravenous  Once 10/29/21 1747 10/29/21 2004   10/29/21 1800  vancomycin (VANCOCIN) IVPB 1000 mg/200 mL premix        1,000 mg 200 mL/hr over 60 Minutes Intravenous  Once 10/29/21 1747 10/29/21 2149   10/29/21 1800  vancomycin (VANCOCIN) IVPB 1000 mg/200 mL premix        1,000 mg 200 mL/hr over 60 Minutes Intravenous  Once 10/29/21 1754 10/29/21 2100       Subjective: Candice Camp no fever, no nausea, no vomiting; in a lot of pain when moved or received skin care. No eating or drinking.   Objective: Vitals:   11/08/21 1500 11/08/21 1600 11/08/21 1746 11/08/21 2146  BP: 118/70 105/74 124/88 (!) 113/91  Pulse: 82 80 91 80  Resp: (!) 9 12 20 18   Temp:   (!) 97.5 F (36.4 C) 98 F (36.7 C)  TempSrc:   Oral   SpO2: 100% 100% 100% 93%  Weight:      Height:        Intake/Output Summary (Last 24 hours) at 11/09/2021 1145 Last data filed at 11/09/2021 0700 Gross per 24 hour  Intake 2199.47 ml  Output 800 ml  Net 1399.47 ml   Filed Weights   11/03/21 0443 11/04/21 0516 11/08/21 0645  Weight: 85.4 kg 89.3 kg 90.7 kg    Physical Exam General exam: unable to follow commands, demonstrating pain when moving her legs/repositioning or received wound care. No fever. Respiratory system: Clear to auscultation. Respiratory effort normal. No requiring oxygen supplementation. Cardiovascular system:RRR. No rubs or gallops. Gastrointestinal system: Abdomen is nondistended, soft and nontender. No organomegaly or masses felt. Normal bowel sounds heard. Central nervous system: unable to properly access with current state. Not following commands, non-verbal. Extremities: No C/C/E, +pedal pulses Skin: No petechiae; positive 3rd spacing edema, complete skin desquamation affecting her posterior legs, inner thighs and buttocks (please refer to Epic for images). Psychiatry: impaired due to encephalopathic process.    Data Reviewed: I  have personally reviewed following labs and imaging studies  CBC: Recent Labs  Lab 11/03/21 0430 11/04/21 0357 11/05/21 0935 11/06/21 0431 11/06/21 1110 11/08/21 0527  WBC 15.4* 14.8* 21.9* 19.5*  --  22.2*  NEUTROABS  --   --  17.6*  --   --   --   HGB 7.9* 7.2* 7.1* 7.3*  --  6.6*  HCT 23.7* 22.7* 22.1* 22.3*  --  19.7*  MCV 82.0 85.0 84.4 83.8  --  82.4  PLT 99* 89* 85* 90* 107* 78*   Basic Metabolic Panel: Recent Labs  Lab 11/03/21 0430 11/04/21 0357 11/05/21 0935 11/06/21 0431 11/07/21 0503 11/08/21 0527  NA 134* 134* 136 135 139 134*  K 3.3* 4.6 3.2* 2.9* 2.6* 2.3*  CL 105 107 111 109 113* 109  CO2 20* 20* 16* 17* 17* 14*  GLUCOSE 139* 125* 119* 169* 198* 165*  BUN 32* 30* 28* 27* 30* 33*  CREATININE 3.51* 3.40* 3.34* 3.41* 3.52* 3.75*  CALCIUM 8.1* 8.2* 7.8* 7.8* 8.2* 7.7*  PHOS 3.2 3.0 2.2*  --  2.8 2.9   GFR: Estimated Creatinine Clearance: 18.9 mL/min (A) (by C-G formula based on SCr of 3.75 mg/dL (H)).  Liver Function Tests: Recent Labs  Lab 11/03/21 0430 11/04/21 0357 11/05/21 0935 11/07/21 0503 11/08/21 0527  ALBUMIN 1.6* 1.9* 1.6* 1.6* 1.8*   Coagulation Profile: Recent Labs  Lab 11/06/21 1110  INR 1.8*    CBG: Recent Labs  Lab 11/07/21 1205 11/07/21 1618 11/07/21 2127 11/08/21 0728 11/08/21 1103  GLUCAP 189* 161* 138* 134* 123*   Urine analysis:    Component Value Date/Time   COLORURINE GREEN (A) 10/29/2021 2005   APPEARANCEUR CLOUDY (A) 10/29/2021 2005   LABSPEC 1.020 10/29/2021 2005   PHURINE 6.0 10/29/2021 2005   GLUCOSEU NEGATIVE 10/29/2021 2005   HGBUR SMALL (A) 10/29/2021 2005   [place] NEGATIVE 10/29/2021 2005   KETONESUR 20 (A) 10/29/2021 2005   PROTEINUR 100 (A) 10/29/2021 2005   NITRITE POSITIVE (A) 10/29/2021 2005   LEUKOCYTESUR LARGE (A) 10/29/2021 2005   Sepsis Labs:  Recent Results (from the past 240 hour(s))  Gastrointestinal Panel by PCR , Stool     Status: None   Collection Time: 10/30/21  5:35  PM   Specimen: Rectum; Stool  Result Value Ref Range Status   Campylobacter species NOT DETECTED NOT DETECTED Final   Plesimonas shigelloides NOT DETECTED NOT DETECTED Final   Salmonella species NOT DETECTED NOT DETECTED Final   Yersinia enterocolitica NOT DETECTED NOT DETECTED Final   Vibrio species NOT DETECTED NOT DETECTED Final   Vibrio cholerae NOT DETECTED NOT DETECTED Final   Enteroaggregative E coli (EAEC) NOT DETECTED NOT DETECTED Final  Enteropathogenic E coli (EPEC) NOT DETECTED NOT DETECTED Final   Enterotoxigenic E coli (ETEC) NOT DETECTED NOT DETECTED Final   Shiga like toxin producing E coli (STEC) NOT DETECTED NOT DETECTED Final   Shigella/Enteroinvasive E coli (EIEC) NOT DETECTED NOT DETECTED Final   Cryptosporidium NOT DETECTED NOT DETECTED Final   Cyclospora cayetanensis NOT DETECTED NOT DETECTED Final   Entamoeba histolytica NOT DETECTED NOT DETECTED Final   Giardia lamblia NOT DETECTED NOT DETECTED Final   Adenovirus F40/41 NOT DETECTED NOT DETECTED Final   Astrovirus NOT DETECTED NOT DETECTED Final   Norovirus GI/GII NOT DETECTED NOT DETECTED Final   Rotavirus A NOT DETECTED NOT DETECTED Final   Sapovirus (I, II, IV, and V) NOT DETECTED NOT DETECTED Final    Comment: Performed at [facility], [address] [place], [place] [address]  C Difficile Quick Screen w PCR reflex     Status: None   Collection Time: 10/30/21  5:35 PM   Specimen: Rectum; Stool  Result Value Ref Range Status   C Diff antigen NEGATIVE NEGATIVE Final   C Diff toxin NEGATIVE NEGATIVE Final   C Diff interpretation No C. difficile detected.  Final    Comment: Performed at [facility], [address] [place], [place] [address]  Urine Culture     Status: None   Collection Time: 11/01/21  8:20 AM   Specimen: Urine, Catheterized  Result Value Ref Range Status   Specimen Description   Final    URINE, CATHETERIZED Performed at [facility], [address], [place], [place]  [address]    Special Requests   Final    NONE Performed at [facility], [address], [place], [place] [address]    Culture   Final    NO GROWTH Performed at [facility], [place] [address], [place], [place] [address]    Report Status 11/02/2021 FINAL  Final    Radiology Studies: No results found.   Scheduled Meds:  sodium chloride   Intravenous Once   Chlorhexidine Gluconate Cloth  6 each Topical Daily   sodium chloride flush  3 mL Intravenous Q12H   Zinc Oxide   Topical QID   Continuous Infusions:  sodium chloride Stopped (10/29/21 2251)   sodium chloride      LOS: 10 days   Barton Dubois M.D on 11/09/2021 at 11:45 AM  Go to [email or web address] - for contact info  [facility] - Office  [phone]  If 7PM-7AM, please contact night-coverage [email or web address] Password [email or web address] 11/09/2021, 11:45 AM

## 2021-11-09 NOTE — Progress Notes (Signed)
Pt was in extreme pain and discomfort PRN pain medication was given pro to changing and cleaning up the pt. Pt wounds were cleaned and  xeroform and abd applied.

## 2021-11-10 LAB — ADAMTS13 ACTIVITY: Adamts 13 Activity: 40.3 % — ABNORMAL LOW (ref >66.8)

## 2021-11-10 LAB — ADAMTS13 ACTIVITY REFLEX

## 2021-11-10 MED ORDER — DIPHENHYDRAMINE HCL 50 MG/ML IJ SOLN
12.5000 mg | Freq: Three times a day (TID) | INTRAMUSCULAR | Status: DC | PRN
  Administered 2021-11-10: 15:00:00 12.5 mg via INTRAVENOUS
  Filled 2021-11-10: qty 1

## 2021-11-10 NOTE — Progress Notes (Signed)
PROGRESS NOTE     Courtney Dyer, is a 55 y.o. female, DOB - August 06, 1966, OEH:[id number]  Admit date - 10/29/2021   Admitting Physician Bernadette Hoit, DO  Outpatient Primary MD for the patient is Pcp, No  LOS - 11  No chief complaint on file.     Altered mentation  Brief Narrative:  55 y.o. female with medical history significant for hyperlipidemia, T2DM, gout, GERD , HTN, CKD IV admitted on 10/30/2021 with AKI on CKD stage IV, severe electrolyte derangement, and persistent  severe dehydration partly due to persistent nausea and vomiting with persistent hypotension requiring IV fluids on IV Levophed for pressure support in the setting of sepsis and dehydration  Assessment & Plan:   Principal Problem:   Sepsis secondary to UTI [place]) Active Problems:   Acute kidney injury ([place])   Hypokalemia   Symptomatic anemia   Hyponatremia   High anion gap metabolic acidosis   Dehydration   Leukocytosis   Microcytic anemia   Hypoalbuminemia due to protein-calorie malnutrition (HCC)   Acute metabolic encephalopathy   Mixed hyperlipidemia   Type 2 diabetes mellitus (HCC)   Gout   Prolonged QT interval   Obesity, Class III, BMI 40-49.9 (morbid obesity) ([place])   1)Persistent Hypotension/hemodynamic instability--- in the setting of dehydration and presumed sepsis -Continues to have low BP and has failed to respond to aggressive management.  -Completed tx with antibiotics; has remained afebrile.  -non-verbal, not following commands, not eating and with steadily worsening renal function. -Long goals of care discussion at bedside; family had decided patient to be DNR/DNI with transition to comfort care management.  -Overall patient is not a candidate for long-term hemodialysis (as discussed with Dr. Candiss Norse and [facility] nephrology). -No growth ever appreciated from her blood, urine and GI cultures. -Continue to focus on comfort care. -Hospice evaluating patient for GIP transition, as  there is no current beds available.  2)AKi on CKD IV with anion gap metabolic acidosis--- suspect ATN --patient with significant dehydration due to persistent nausea and vomiting, also suspect kidney injury secondary to persistent hypotension -Solitary L kidney, hx/o R nephrectomy;  - CT a/p with dysplastic right kidney and no stones or hydronephrosis on the left.   creatinine was around 10, BUN around 160 -Scr trending up 3.8 currently. -Urine output has dropped significantly and fell to improve by receiving diuretics, IV fluids and albumin.   -Discussion with nephrology service sustained expressing no good candidate for hemodialysis or CVVH at this point. -Family in acceptance and looking for full comfort care and symptomatic management only. -Stopping treatment and blood work moving forward.  3) acute on chronic anemia--- no evidence of ongoing bleeding suspect some component of chronic anemia of CKD -Serum iron is 81 iron saturation 71, ferritin is not low,  -B12 and folate are stable. -Patient has received a total of 2 units of PRBC during this admission. -Last hemoglobin 6.6 on 12/25 blood work; after discussing with family and transitioning to full comfort care only no transfusion will be provided. -ESA treatment at this moment discontinued. -Prior bleeding appreciated. -no further blood drawn planned; continue comfort care.  4)Chronic breast wound--- does not appear infected, Healing left breast periareolar wound--please see ultrasound report from 10/27/2021. -Continue wound care and symptomatic management..  5)Hypokalemia/hyponatremia/hypophosphatemia--electrolyte derangement due to persistent diarrhea and poor oral intake. --No further blood work to follow electrolytes trend anticipated --Patient's care has been transition to full comfort.  6)DM2- A1C 5.2 reflecting excellent diabetic control PTA -Transitioning to full comfort  care. -Stopping checking CBGs or providing sliding  scale insulin at this point.  7)Social/Ethics--patient's aunt [name] is primary contact and decision maker [phone]) -NOK updated at bedside -Goals of care discussion decision has been made for DNR/DNI -Overall prognosis is is poor and hospital death would not be a surprise. -Palliative care has been consulted and will follow any further recommendations for symptomatic care.. -Further discussion with family member has trigger decision for full comfort care. -Patient is a good candidate for inpatient hospice.  8)Sepsis with Septic Shock--- suspect related to UTI and pneumonia -PCT 0.66 -WBC has trended down appropriately overall. -Culture data and antibiotics as above #1 -Currently off Levophed. -Patient has remained afebrile and completed antibiotic therapy. -Currently transitioning to full comfort/symptomatic management only.  9) Acute metabolic encephalopathy: Multifactorial, secondary to sepsis with septic shock and persistent hypotension, dehydration, worsening renal function and electrolyte and metabolic derangement as mentioned above -Improved with hemodialysis; and subsequently demonstrating further decline. -patient is still no eating/drinking, unable to follow commands and nonverbal. -CT scan demonstrating no acute intracranial normalities; given prolonged episodes of hypotension there is concern for hypoxic injury. -Further discussion with family members has triggered decision for full comfort care with symptomatic management only.  10) Diarrhea-- C. diff and GI pathogen/culture is Negative -Rectal tube placed and IV fluids provided -No further labs anticipated and patient's care has been transitioned to full comfort. -diarrhea continue; even slight improvement in quantity reported. Marland Kitchen   11) severe protein calorie malnutrition -In the setting of acute illness and inability to take adequate nutrition/hydration. -Low albumin appreciated in her labs. -Family in  agreement not to pursued artificial nutrition and to focus on comfort care.   12-thrombocytopenia: -multifactorial -DVT prophylaxis discontinue at this moment -continue full comfort care and symptomatic management.  Disposition/Need for in-Hospital Stay- patient unable to be discharged at this time due to -Persistent hypotension, no eating, no drinking, non-verbal and now with further worsening of renal function and decreased urine output. -Pending discussion with patient's whole family at bedside who agrees at this moment to transition to full comfort care. -good candidate inpatient hospice; [facility] made aware. Hospice not available today due to festivity.  Status is: Inpatient  Remains inpatient appropriate because: As above  Disposition: The patient is from: Long-term SNF resident              Anticipated d/c is to: Inpatient hospice.   Barriers: Not Clinically Stable-   Code Status :  -  Code Status: DNR transitioning to full comfort care.  Family Communication:   patient's whole family (aunt and cousins) updated at bedside.  Consults  : Nephrology/general surgery/palliative care -case discussed with Dr. Candiss Norse (nephrology) on 11/05/21. -chart reviewed for recommendations by Dr. Arty Baumgartner (nephrology service 11/06/21) -Case discussed with Dr. Marval Regal (nephrology service) 11/07/2021.  DVT Prophylaxis  :   - SCDs ; comfort care.  Lab Results  Component Value Date   PLT 78 (L) 11/08/2021    Inpatient Medications  Scheduled Meds:  sodium chloride flush  3 mL Intravenous Q12H   Zinc Oxide   Topical QID   Continuous Infusions:  sodium chloride     PRN Meds:.sodium chloride, acetaminophen **OR** acetaminophen, antiseptic oral rinse, glycopyrrolate **OR** glycopyrrolate **OR** glycopyrrolate, HYDROmorphone (DILAUDID) injection, loperamide, LORazepam **OR** LORazepam, polyvinyl alcohol, sodium chloride flush   Anti-infectives (From admission, onward)    Start      Dose/Rate Route Frequency Ordered Stop   11/02/21 2000  ceFEPIme (MAXIPIME) 2 g in sodium  chloride 0.9 % 100 mL IVPB        2 g 200 mL/hr over 30 Minutes Intravenous Every 24 hours 11/02/21 1316 11/07/21 2040   11/02/21 1800  linezolid (ZYVOX) IVPB 600 mg        600 mg 300 mL/hr over 60 Minutes Intravenous Every 12 hours 11/02/21 1247 11/07/21 2238   10/31/21 1400  vancomycin (VANCOCIN) IVPB 750 mg/150 ml premix        750 mg 150 mL/hr over 60 Minutes Intravenous Every Dialysis 10/31/21 0835 10/31/21 1840   10/30/21 2000  vancomycin (VANCOREADY) IVPB 1500 mg/300 mL  Status:  Discontinued        1,500 mg 150 mL/hr over 120 Minutes Intravenous Every 24 hours 10/29/21 1806 10/29/21 2007   10/30/21 2000  ceFEPIme (MAXIPIME) 1 g in sodium chloride 0.9 % 100 mL IVPB  Status:  Discontinued        1 g 200 mL/hr over 30 Minutes Intravenous Every 24 hours 10/29/21 2009 11/02/21 1316   10/30/21 1600  vancomycin (VANCOREADY) IVPB 750 mg/150 mL        750 mg 150 mL/hr over 60 Minutes Intravenous Every M-W-F (Hemodialysis) 10/30/21 1146 10/30/21 2006   10/30/21 1136  vancomycin variable dose per unstable renal function (pharmacist dosing)  Status:  Discontinued         Does not apply See admin instructions 10/30/21 1137 11/02/21 1247   10/30/21 0400  ceFEPIme (MAXIPIME) 2 g in sodium chloride 0.9 % 100 mL IVPB  Status:  Discontinued        2 g 200 mL/hr over 30 Minutes Intravenous Every 8 hours 10/29/21 1806 10/29/21 2007   10/29/21 2015  ceFEPIme (MAXIPIME) 1 g in sodium chloride 0.9 % 100 mL IVPB  Status:  Discontinued        1 g 200 mL/hr over 30 Minutes Intravenous Every 24 hours 10/29/21 2007 10/29/21 2009   10/29/21 1800  ceFEPIme (MAXIPIME) 2 g in sodium chloride 0.9 % 100 mL IVPB        2 g 200 mL/hr over 30 Minutes Intravenous  Once 10/29/21 1747 10/29/21 1852   10/29/21 1800  metroNIDAZOLE (FLAGYL) IVPB 500 mg        500 mg 100 mL/hr over 60 Minutes Intravenous  Once 10/29/21 1747  10/29/21 2004   10/29/21 1800  vancomycin (VANCOCIN) IVPB 1000 mg/200 mL premix        1,000 mg 200 mL/hr over 60 Minutes Intravenous  Once 10/29/21 1747 10/29/21 2149   10/29/21 1800  vancomycin (VANCOCIN) IVPB 1000 mg/200 mL premix        1,000 mg 200 mL/hr over 60 Minutes Intravenous  Once 10/29/21 1754 10/29/21 2100       Subjective: Candice Camp no fever, no chest pain, no nausea or vomiting reported.  Slightly more comfortable on today's examination.  Objective: Vitals:   11/08/21 1746 11/08/21 2146 11/09/21 1600 11/09/21 2211  BP: 124/88 (!) 113/91  98/64  Pulse: 91 80  (!) 110  Resp: 20 18 (!) 8   Temp: (!) 97.5 F (36.4 C) 98 F (36.7 C)  (!) 97.5 F (36.4 C)  TempSrc: Oral   Oral  SpO2: 100% 93%  100%  Weight:      Height:        Intake/Output Summary (Last 24 hours) at 11/10/2021 1212 Last data filed at 11/09/2021 2217 Gross per 24 hour  Intake 50 ml  Output 300 ml  Net -250 ml  Filed Weights   11/03/21 0443 11/04/21 0516 11/08/21 0645  Weight: 85.4 kg 89.3 kg 90.7 kg    Physical Exam General exam: No eating, not drinking, nonverbal; slightly more comfortable on today's examination after adjustment to analgesic regimen. Respiratory system: Good saturation on room air. Cardiovascular system:RRR. No murmurs, rubs, gallops.  Unable to properly assess JVD. Gastrointestinal system: Abdomen is obese, nondistended, soft and nontender. No organomegaly or masses felt. Normal bowel sounds heard. Central nervous system: No following commands.  Intermittently able to move limbs spontaneously. Extremities: No cyanosis or clubbing Skin: No petechiae seen; positive peripheral edema in the setting of third spacing.  Complete extensive desquamation in the posterior aspect of her thighs and buttocks area.  Very painful to touch.  Please refer to epic media. Psychiatry: Unable to properly assess with current encephalopathic process.   Data Reviewed: I have personally  reviewed following labs and imaging studies  CBC: Recent Labs  Lab 11/04/21 0357 11/05/21 0935 11/06/21 0431 11/06/21 1110 11/08/21 0527  WBC 14.8* 21.9* 19.5*  --  22.2*  NEUTROABS  --  17.6*  --   --   --   HGB 7.2* 7.1* 7.3*  --  6.6*  HCT 22.7* 22.1* 22.3*  --  19.7*  MCV 85.0 84.4 83.8  --  82.4  PLT 89* 85* 90* 107* 78*   Basic Metabolic Panel: Recent Labs  Lab 11/04/21 0357 11/05/21 0935 11/06/21 0431 11/07/21 0503 11/08/21 0527  NA 134* 136 135 139 134*  K 4.6 3.2* 2.9* 2.6* 2.3*  CL 107 111 109 113* 109  CO2 20* 16* 17* 17* 14*  GLUCOSE 125* 119* 169* 198* 165*  BUN 30* 28* 27* 30* 33*  CREATININE 3.40* 3.34* 3.41* 3.52* 3.75*  CALCIUM 8.2* 7.8* 7.8* 8.2* 7.7*  PHOS 3.0 2.2*  --  2.8 2.9   GFR: Estimated Creatinine Clearance: 18.9 mL/min (A) (by C-G formula based on SCr of 3.75 mg/dL (H)).  Liver Function Tests: Recent Labs  Lab 11/04/21 0357 11/05/21 0935 11/07/21 0503 11/08/21 0527  ALBUMIN 1.9* 1.6* 1.6* 1.8*   Coagulation Profile: Recent Labs  Lab 11/06/21 1110  INR 1.8*    CBG: Recent Labs  Lab 11/07/21 1205 11/07/21 1618 11/07/21 2127 11/08/21 0728 11/08/21 1103  GLUCAP 189* 161* 138* 134* 123*   Urine analysis:    Component Value Date/Time   COLORURINE GREEN (A) 10/29/2021 2005   APPEARANCEUR CLOUDY (A) 10/29/2021 2005   LABSPEC 1.020 10/29/2021 2005   PHURINE 6.0 10/29/2021 2005   GLUCOSEU NEGATIVE 10/29/2021 2005   HGBUR SMALL (A) 10/29/2021 2005   [place] NEGATIVE 10/29/2021 2005   KETONESUR 20 (A) 10/29/2021 2005   PROTEINUR 100 (A) 10/29/2021 2005   NITRITE POSITIVE (A) 10/29/2021 2005   LEUKOCYTESUR LARGE (A) 10/29/2021 2005   Sepsis Labs:  Recent Results (from the past 240 hour(s))  Urine Culture     Status: None   Collection Time: 11/01/21  8:20 AM   Specimen: Urine, Catheterized  Result Value Ref Range Status   Specimen Description   Final    URINE, CATHETERIZED Performed at [facility], [address], [place], [place] [address]    Special Requests   Final    NONE Performed at [facility], [address], [place], [place] [address]    Culture   Final    NO GROWTH Performed at [facility], [place] [address], [place], [place] [address]    Report Status 11/02/2021 FINAL  Final  Radiology Studies: No results found.   Scheduled Meds:  sodium chloride flush  3 mL Intravenous Q12H   Zinc Oxide   Topical QID   Continuous Infusions:  sodium chloride      LOS: 11 days   Barton Dubois M.D on 11/10/2021 at 12:12 PM  Go to [email or web address] - for contact info  [facility] - Office  [phone]  If 7PM-7AM, please contact night-coverage [email or web address] Password [email or web address] 11/10/2021, 12:12 PM

## 2021-11-10 NOTE — Progress Notes (Signed)
Nutrition Brief Note  Chart reviewed. Per palliative pt/family have chosen to transition to comfort measures and she plans to discharge [facility].  No further nutrition interventions planned at this time.      Colman Cater MS,RD,CSG,LDN Contact: Shea Evans

## 2021-11-10 NOTE — Progress Notes (Signed)
Family members stated they would like patient to be placed on a telemetry monitor. MD [facility] notified. MD called family and both agreed to not be placed on telemetry.

## 2021-11-11 ENCOUNTER — Inpatient Hospital Stay (HOSPITAL_COMMUNITY)
Admission: RE | Admit: 2021-11-11 | Discharge: 2021-11-15 | DRG: 871 | Disposition: E | Source: Ambulatory Visit | Attending: Internal Medicine | Admitting: Internal Medicine

## 2021-11-11 DIAGNOSIS — Z515 Encounter for palliative care: Secondary | ICD-10-CM | POA: Diagnosis not present

## 2021-11-11 DIAGNOSIS — Q614 Renal dysplasia: Secondary | ICD-10-CM | POA: Diagnosis not present

## 2021-11-11 DIAGNOSIS — Z6834 Body mass index (BMI) 34.0-34.9, adult: Secondary | ICD-10-CM | POA: Diagnosis not present

## 2021-11-11 DIAGNOSIS — A419 Sepsis, unspecified organism: Principal | ICD-10-CM | POA: Diagnosis present

## 2021-11-11 DIAGNOSIS — F17210 Nicotine dependence, cigarettes, uncomplicated: Secondary | ICD-10-CM | POA: Diagnosis present

## 2021-11-11 DIAGNOSIS — N1832 Chronic kidney disease, stage 3b: Secondary | ICD-10-CM | POA: Diagnosis present

## 2021-11-11 DIAGNOSIS — N390 Urinary tract infection, site not specified: Secondary | ICD-10-CM | POA: Diagnosis present

## 2021-11-11 DIAGNOSIS — K219 Gastro-esophageal reflux disease without esophagitis: Secondary | ICD-10-CM | POA: Diagnosis present

## 2021-11-11 DIAGNOSIS — G9341 Metabolic encephalopathy: Secondary | ICD-10-CM | POA: Diagnosis present

## 2021-11-11 DIAGNOSIS — N170 Acute kidney failure with tubular necrosis: Secondary | ICD-10-CM | POA: Diagnosis present

## 2021-11-11 DIAGNOSIS — E860 Dehydration: Secondary | ICD-10-CM | POA: Diagnosis present

## 2021-11-11 DIAGNOSIS — R6521 Severe sepsis with septic shock: Secondary | ICD-10-CM | POA: Diagnosis present

## 2021-11-11 DIAGNOSIS — E43 Unspecified severe protein-calorie malnutrition: Secondary | ICD-10-CM | POA: Diagnosis present

## 2021-11-11 DIAGNOSIS — Z66 Do not resuscitate: Secondary | ICD-10-CM | POA: Diagnosis not present

## 2021-11-11 DIAGNOSIS — E8729 Other acidosis: Secondary | ICD-10-CM | POA: Diagnosis not present

## 2021-11-11 DIAGNOSIS — N184 Chronic kidney disease, stage 4 (severe): Secondary | ICD-10-CM | POA: Diagnosis present

## 2021-11-11 DIAGNOSIS — E1122 Type 2 diabetes mellitus with diabetic chronic kidney disease: Secondary | ICD-10-CM | POA: Diagnosis present

## 2021-11-11 DIAGNOSIS — E871 Hypo-osmolality and hyponatremia: Secondary | ICD-10-CM | POA: Diagnosis present

## 2021-11-11 DIAGNOSIS — Z7189 Other specified counseling: Secondary | ICD-10-CM

## 2021-11-11 DIAGNOSIS — J189 Pneumonia, unspecified organism: Secondary | ICD-10-CM | POA: Diagnosis present

## 2021-11-11 DIAGNOSIS — I129 Hypertensive chronic kidney disease with stage 1 through stage 4 chronic kidney disease, or unspecified chronic kidney disease: Secondary | ICD-10-CM | POA: Diagnosis present

## 2021-11-11 DIAGNOSIS — Z905 Acquired absence of kidney: Secondary | ICD-10-CM

## 2021-11-11 DIAGNOSIS — D696 Thrombocytopenia, unspecified: Secondary | ICD-10-CM | POA: Diagnosis present

## 2021-11-11 DIAGNOSIS — D6959 Other secondary thrombocytopenia: Secondary | ICD-10-CM | POA: Diagnosis present

## 2021-11-11 DIAGNOSIS — Z20822 Contact with and (suspected) exposure to covid-19: Secondary | ICD-10-CM | POA: Diagnosis present

## 2021-11-11 DIAGNOSIS — N179 Acute kidney failure, unspecified: Secondary | ICD-10-CM

## 2021-11-11 DIAGNOSIS — E872 Acidosis, unspecified: Secondary | ICD-10-CM | POA: Diagnosis present

## 2021-11-11 DIAGNOSIS — R197 Diarrhea, unspecified: Secondary | ICD-10-CM | POA: Diagnosis present

## 2021-11-11 DIAGNOSIS — D631 Anemia in chronic kidney disease: Secondary | ICD-10-CM | POA: Diagnosis present

## 2021-11-11 DIAGNOSIS — E7800 Pure hypercholesterolemia, unspecified: Secondary | ICD-10-CM | POA: Diagnosis present

## 2021-11-11 DIAGNOSIS — M109 Gout, unspecified: Secondary | ICD-10-CM | POA: Diagnosis present

## 2021-11-11 DIAGNOSIS — Z23 Encounter for immunization: Secondary | ICD-10-CM | POA: Diagnosis not present

## 2021-11-11 DIAGNOSIS — E876 Hypokalemia: Secondary | ICD-10-CM | POA: Diagnosis present

## 2021-11-11 DIAGNOSIS — E782 Mixed hyperlipidemia: Secondary | ICD-10-CM

## 2021-11-11 MED ORDER — HALOPERIDOL LACTATE 2 MG/ML PO CONC: 0.5000 mg | ORAL | Status: DC | PRN

## 2021-11-11 MED ORDER — LORAZEPAM 2 MG/ML IJ SOLN
1.0000 mg | INTRAMUSCULAR | Status: DC | PRN
  Administered 2021-11-13: 14:00:00 1 mg via INTRAVENOUS
  Filled 2021-11-11: qty 1

## 2021-11-11 MED ORDER — HYDROMORPHONE HCL 1 MG/ML IJ SOLN
1.0000 mg | INTRAMUSCULAR | Status: DC | PRN
  Administered 2021-11-12 – 2021-11-13 (×8): 1 mg via INTRAVENOUS
  Filled 2021-11-11 (×9): qty 1

## 2021-11-11 MED ORDER — LORAZEPAM 1 MG PO TABS: 1.0000 mg | ORAL_TABLET | ORAL | Status: DC | PRN

## 2021-11-11 MED ORDER — HALOPERIDOL 0.5 MG PO TABS: 0.5000 mg | ORAL_TABLET | ORAL | Status: DC | PRN

## 2021-11-11 MED ORDER — ONDANSETRON HCL 4 MG/2ML IJ SOLN: 4.0000 mg | Freq: Four times a day (QID) | INTRAMUSCULAR | Status: DC | PRN

## 2021-11-11 MED ORDER — GLYCOPYRROLATE 1 MG PO TABS: 1.0000 mg | ORAL_TABLET | ORAL | Status: DC | PRN

## 2021-11-11 MED ORDER — BIOTENE DRY MOUTH MT LIQD: 15.0000 mL | OROMUCOSAL | Status: DC | PRN

## 2021-11-11 MED ORDER — GLYCOPYRROLATE 0.2 MG/ML IJ SOLN: 0.2000 mg | INTRAMUSCULAR | Status: DC | PRN

## 2021-11-11 MED ORDER — ACETAMINOPHEN 325 MG PO TABS: 650.0000 mg | ORAL_TABLET | Freq: Four times a day (QID) | ORAL | Status: DC | PRN

## 2021-11-11 MED ORDER — ONDANSETRON 4 MG PO TBDP: 4.0000 mg | ORAL_TABLET | Freq: Four times a day (QID) | ORAL | Status: DC | PRN

## 2021-11-11 MED ORDER — ACETAMINOPHEN 650 MG RE SUPP: 650.0000 mg | Freq: Four times a day (QID) | RECTAL | Status: DC | PRN

## 2021-11-11 MED ORDER — POLYVINYL ALCOHOL 1.4 % OP SOLN: 1.0000 [drp] | Freq: Four times a day (QID) | OPHTHALMIC | Status: DC | PRN

## 2021-11-11 MED ORDER — HALOPERIDOL LACTATE 5 MG/ML IJ SOLN: 0.5000 mg | INTRAMUSCULAR | Status: DC | PRN

## 2021-11-11 MED ORDER — LORAZEPAM 2 MG/ML PO CONC: 1.0000 mg | ORAL | Status: DC | PRN

## 2021-11-11 NOTE — H&P (Signed)
History and Physical  BUFFEY ZABINSKI OIZ:[id number] DOB: Mar 23, 1966 DOA: 10/29/2021   PCP: Pcp, No   Patient coming from: Home  Chief Complaint: end of life care  HPI:  Courtney Dyer is a 55 y.o. female with medical history significant for hyperlipidemia, T2DM, gout, GERD , HTN, CKD IV admitted on 10/30/2021 with AKI on CKD stage IV, severe electrolyte derangement, and persistent  severe dehydration partly due to persistent nausea and vomiting with persistent hypotension requiring IV fluids on IV Levophed for pressure support in the setting of sepsis and dehydration.  Urinalysis showed greater than 50 WBC.  Although urine culture and blood cultures were negative, the patient was treated empirically with cefepime and vancomycin.  Vancomycin was subsequently switched over to linezolid.  The patient finished 10 days of empiric antibiotic therapy.  The patient's renal function continued to worsen.  Nephrology was consulted.  Hemodialysis was initiated 10/30/2021.  She was also dialyzed on 10/31/2021.  Unfortunately, the patient continued to worsen clinically.  Patient was treated with IV fluids and albumin.  Her urine output continued to decline.  Palliative medicine was consulted.  Goals of care discussion was held.  The patient continued to be lethargic without much improvement in her mental status.  Her renal function did not show significant improvement.  In fact, her renal function continued to worsen again after 2 sessions of dialysis.  After goals of therapy, family did not want to pursue any further RRT.  Ultimately, the patient was transitioned to comfort care measures only.  Social work assisted in transitioning patient to residential hospice.  Because there were no beds available, the patient was transition to general inpatient hospice admission.  Full comfort measures were continued.    Assessment/Plan: 1)Persistent Hypotension/hemodynamic instability--- in the setting of  dehydration and presumed sepsis -Continues to have low BP and has failed to respond to aggressive management.  -Completed tx with antibiotics; has remained afebrile.  -non-verbal, not following commands, not eating and with steadily worsening renal function. -Long goals of care discussion at bedside; family had decided patient to be DNR/DNI with transition to comfort care management.  -Overall patient is not a candidate for long-term hemodialysis (as discussed with Dr. Candiss Norse and [facility] nephrology). -No growth ever appreciated from her blood, urine and GI cultures. -Continue to focus on comfort care. -Hospice evaluating patient for GIP transition, as there is no current beds available.   2)AKi on CKD 3b with anion gap metabolic acidosis--- suspect ATN --patient with significant dehydration due to persistent nausea and vomiting, also suspect kidney injury secondary to persistent hypotension -baseline creatinine 1.7-1.9 -Solitary L kidney, hx/o R nephrectomy;  - CT a/p with dysplastic right kidney and no stones or hydronephrosis on the left.   creatinine was around 10, BUN around 160 -Scr trending up 3.8 currently. -Urine output has dropped significantly and fell to improve by receiving diuretics, IV fluids and albumin.   -Discussion with nephrology service sustained expressing no good candidate for hemodialysis or CVVH at this point. -Family in acceptance and looking for full comfort care and symptomatic management only. -Stopping treatment and blood work moving forward.   3) acute on chronic anemia--- no evidence of ongoing bleeding suspect some component of chronic anemia of CKD -Serum iron is 81 iron saturation 71, ferritin is not low,  -B12 and folate are stable. -Patient has received a total of 2 units of PRBC during this admission. -Last hemoglobin 6.6 on 12/25 blood work; after discussing  with family and transitioning to full comfort care only no transfusion will be provided. -ESA  treatment at this moment discontinued. -Prior bleeding appreciated. -no further blood drawn planned; continue comfort care.   4)Chronic breast wound--- does not appear infected, Healing left breast periareolar wound--please see ultrasound report from 10/27/2021. -Continue wound care and symptomatic management..   5)Hypokalemia/hyponatremia/hypophosphatemia--electrolyte derangement due to persistent diarrhea and poor oral intake. --No further blood work to follow electrolytes trend anticipated --Patient's care has been transition to full comfort.   6)DM2- A1C 5.2 reflecting excellent diabetic control PTA -Transitioning to full comfort care. -Stopping checking CBGs or providing sliding scale insulin at this point.   7)Social/Ethics--patient's aunt [name] is primary contact and decision maker [phone]) -NOK updated at bedside -Goals of care discussion decision has been made for DNR/DNI -Overall prognosis is is poor and hospital death is expected -Palliative care has been consulted and will follow any further recommendations for symptomatic care.. -Further discussion with family member has trigger decision for full comfort care. -Patient is a good candidate for inpatient hospice.   8)Sepsis with Septic Shock--- related to UTI and pneumonia -PCT 0.66 -WBC has trended down appropriately overall but remains elevated -Culture data and antibiotics as above #1 -weaned off Levophed. -Patient has remained afebrile and completed antibiotic therapy. -Currently transitioning to full comfort/symptomatic management only.   9) Acute metabolic encephalopathy: Multifactorial, secondary to sepsis with septic shock and persistent hypotension, dehydration, worsening renal function and electrolyte and metabolic derangement as mentioned above -Improved with hemodialysis; and subsequently demonstrating further decline. -patient is still no eating/drinking, unable to follow commands and  nonverbal. -CT scan demonstrating no acute intracranial normalities; given prolonged episodes of hypotension there is concern for hypoxic injury. -Further discussion with family members has triggered decision for full comfort care with symptomatic management only.   10) Diarrhea-- C. diff and GI pathogen/culture is Negative -Rectal tube placed and IV fluids provided -No further labs anticipated and patient's care has been transitioned to full comfort. -diarrhea continue; even slight improvement in quantity reported. Marland Kitchen    11) severe protein calorie malnutrition -In the setting of acute illness and inability to take adequate nutrition/hydration. -Low albumin appreciated in her labs. -Family in agreement not to pursued artificial nutrition and to focus on comfort care.    12-thrombocytopenia: -multifactorial -DVT prophylaxis discontinue at this moment -continue full comfort care and symptomatic management.   Disposition/Need for in-Hospital Stay- patient unable to be discharged at this time due to -Persistent hypotension, no eating, no drinking, non-verbal and now with further worsening of renal function and decreased urine output. -Pending discussion with patient's whole family at bedside who agrees at this moment to transition to full comfort care. -good candidate inpatient hospice; [facility] made aware.      Past Medical History:  Diagnosis Date   Diabetes mellitus    Gout    High cholesterol    Past Surgical History:  Procedure Laterality Date   BIOPSY  08/28/2021   Procedure: BIOPSY;  Surgeon: Harvel Quale, MD;  Location: [facility];  Service: Gastroenterology;;   BREAST SURGERY     ESOPHAGOGASTRODUODENOSCOPY (EGD) WITH PROPOFOL N/A 08/28/2021   Procedure: ESOPHAGOGASTRODUODENOSCOPY (EGD) WITH PROPOFOL;  Surgeon: Harvel Quale, MD;  Location: [facility];  Service: Gastroenterology;  Laterality: N/A;   NEPHRECTOMY     Social History:  reports that  she has been smoking cigarettes. She has been smoking an average of .5 packs per day. She has never used  smokeless tobacco. She reports current alcohol use. She reports that she does not use drugs.   Family History  Problem Relation Age of Onset   Hypertension Maternal Aunt      No Known Allergies   Prior to Admission medications   Not on File    Unobtainable due to encephalopathy  Physical Exam: There were no vitals filed for this visit. General:  resting peacefully.  unresponsive Head/Eye: No conjunctival hemorrhage, no icterus, /AT,  ENT:  No icterus,  No thrush Neck:  No masses, no lymphadenpathy, no bruits CV:  RRR Lung:  bilateral rhonchi Abdomen: soft/NT, +BS, nondistended, no peritoneal signs Ext: No lymphangitis, 1 + LE edema   Labs on Admission:  Basic Metabolic Panel: Recent Labs  Lab 11/05/21 0935 11/06/21 0431 11/07/21 0503 11/08/21 0527  NA 136 135 139 134*  K 3.2* 2.9* 2.6* 2.3*  CL 111 109 113* 109  CO2 16* 17* 17* 14*  GLUCOSE 119* 169* 198* 165*  BUN 28* 27* 30* 33*  CREATININE 3.34* 3.41* 3.52* 3.75*  CALCIUM 7.8* 7.8* 8.2* 7.7*  PHOS 2.2*  --  2.8 2.9   Liver Function Tests: Recent Labs  Lab 11/05/21 0935 11/07/21 0503 11/08/21 0527  ALBUMIN 1.6* 1.6* 1.8*   No results for input(s): LIPASE, AMYLASE in the last 168 hours. No results for input(s): AMMONIA in the last 168 hours. CBC: Recent Labs  Lab 11/05/21 0935 11/06/21 0431 11/06/21 1110 11/08/21 0527  WBC 21.9* 19.5*  --  22.2*  NEUTROABS 17.6*  --   --   --   HGB 7.1* 7.3*  --  6.6*  HCT 22.1* 22.3*  --  19.7*  MCV 84.4 83.8  --  82.4  PLT 85* 90* 107* 78*   Coagulation Profile: Recent Labs  Lab 11/06/21 1110  INR 1.8*   Cardiac Enzymes: No results for input(s): CKTOTAL, CKMB, CKMBINDEX, TROPONINI in the last 168 hours. BNP: Invalid input(s): POCBNP CBG: Recent Labs  Lab 11/07/21 1205 11/07/21 1618 11/07/21 2127 11/08/21 0728 11/08/21 1103  GLUCAP 189*  161* 138* 134* 123*   Urine analysis:    Component Value Date/Time   COLORURINE GREEN (A) 10/29/2021 2005   APPEARANCEUR CLOUDY (A) 10/29/2021 2005   LABSPEC 1.020 10/29/2021 2005   PHURINE 6.0 10/29/2021 2005   GLUCOSEU NEGATIVE 10/29/2021 2005   HGBUR SMALL (A) 10/29/2021 2005   BILIRUBINUR NEGATIVE 10/29/2021 2005   KETONESUR 20 (A) 10/29/2021 2005   PROTEINUR 100 (A) 10/29/2021 2005   NITRITE POSITIVE (A) 10/29/2021 2005   LEUKOCYTESUR LARGE (A) 10/29/2021 2005   Sepsis Labs: @LABRCNTIP (procalcitonin:4,lacticidven:4) )No results found for this or any previous visit (from the past 240 hour(s)).   Radiological Exams on Admission: No results found.      Time spent:50 minutes Code Status:   FULL COMFORT Family Communication:   Family at bedside Disposition Plan: in-hospital death Consults called: palliative med  DVT Prophylaxis: FULL COMFORT  Orson Eva, DO  [facility] Pager [phone]  If 7PM-7AM, please contact night-coverage [email or web address] Password [facility] 10/21/2021, 3:56 PM

## 2021-11-11 NOTE — Discharge Summary (Signed)
Physician Discharge Summary  EMRI SAMPLE ZOX:[id number] DOB: [birth year] DOA: 10/29/2021  PCP: Merryl Hacker, No  Admit date: 10/29/2021 Discharge date: 10/20/2021  Admitted From: Home Disposition:  General inpatient hospice   Brief/Interim Summary: 55 y.o. female with medical history significant for hyperlipidemia, T2DM, gout, GERD , HTN, CKD IV admitted on 10/30/2021 with AKI on CKD stage IV, severe electrolyte derangement, and persistent  severe dehydration partly due to persistent nausea and vomiting with persistent hypotension requiring IV fluids on IV Levophed for pressure support in the setting of sepsis and dehydration.  Urinalysis showed greater than 50 WBC.  Although urine culture and blood cultures were negative, the patient was treated empirically with cefepime and vancomycin.  Vancomycin was subsequently switched over to linezolid.  The patient finished 10 days of empiric antibiotic therapy.  The patient's renal function continued to worsen.  Nephrology was consulted.  Hemodialysis was initiated 10/30/2021.  She was also dialyzed on 10/31/2021.  Unfortunately, the patient continued to worsen clinically.  Patient was treated with IV fluids and albumin.  Her urine output continued to decline.  Palliative medicine was consulted.  Goals of care discussion was held.  The patient continued to be lethargic without much improvement in her mental status.  Her renal function did not show significant improvement.  In fact, her renal function continued to worsen again after 2 sessions of dialysis.  After goals of therapy, family did not want to pursue any further RRT.  Ultimately, the patient was transitioned to comfort care measures only.  Social work assisted in transitioning patient to residential hospice.  Because there were no beds available, the patient was transition to general inpatient hospice admission.  Full comfort measures were continued.  Discharge Diagnoses:  1)Persistent  Hypotension/hemodynamic instability--- in the setting of dehydration and presumed sepsis -Continues to have low BP and has failed to respond to aggressive management.  -Completed tx with antibiotics; has remained afebrile.  -non-verbal, not following commands, not eating and with steadily worsening renal function. -Long goals of care discussion at bedside; family had decided patient to be DNR/DNI with transition to comfort care management.  -Overall patient is not a candidate for long-term hemodialysis (as discussed with Dr. Candiss Norse and [facility] nephrology). -No growth ever appreciated from her blood, urine and GI cultures. -Continue to focus on comfort care. -Hospice evaluating patient for GIP transition, as there is no current beds available.   2)AKi on CKD 3b with anion gap metabolic acidosis--- suspect ATN --patient with significant dehydration due to persistent nausea and vomiting, also suspect kidney injury secondary to persistent hypotension -baseline creatinine 1.7-1.9 -Solitary L kidney, hx/o R nephrectomy;  - CT a/p with dysplastic right kidney and no stones or hydronephrosis on the left.   creatinine was around 10, BUN around 160 -Scr trending up 3.8 currently. -Urine output has dropped significantly and fell to improve by receiving diuretics, IV fluids and albumin.   -Discussion with nephrology service sustained expressing no good candidate for hemodialysis or CVVH at this point. -Family in acceptance and looking for full comfort care and symptomatic management only. -Stopping treatment and blood work moving forward.   3) acute on chronic anemia--- no evidence of ongoing bleeding suspect some component of chronic anemia of CKD -Serum iron is 81 iron saturation 71, ferritin is not low,  -B12 and folate are stable. -Patient has received a total of 2 units of PRBC during this admission. -Last hemoglobin 6.6 on 12/25 blood work; after discussing with family and transitioning to full  comfort care only no transfusion will be provided. -ESA treatment at this moment discontinued. -Prior bleeding appreciated. -no further blood drawn planned; continue comfort care.   4)Chronic breast wound--- does not appear infected, Healing left breast periareolar wound--please see ultrasound report from 10/27/2021. -Continue wound care and symptomatic management..   5)Hypokalemia/hyponatremia/hypophosphatemia--electrolyte derangement due to persistent diarrhea and poor oral intake. --No further blood work to follow electrolytes trend anticipated --Patient's care has been transition to full comfort.   6)DM2- A1C 5.2 reflecting excellent diabetic control PTA -Transitioning to full comfort care. -Stopping checking CBGs or providing sliding scale insulin at this point.   7)Social/Ethics--patient's aunt [name] is primary contact and decision maker [phone]) -NOK updated at bedside -Goals of care discussion decision has been made for DNR/DNI -Overall prognosis is is poor and hospital death is expected -Palliative care has been consulted and will follow any further recommendations for symptomatic care.. -Further discussion with family member has trigger decision for full comfort care. -Patient is a good candidate for inpatient hospice.   8)Sepsis with Septic Shock--- related to UTI and pneumonia -PCT 0.66 -WBC has trended down appropriately overall but remains elevated -Culture data and antibiotics as above #1 -weaned off Levophed. -Patient has remained afebrile and completed antibiotic therapy. -Currently transitioning to full comfort/symptomatic management only.   9) Acute metabolic encephalopathy: Multifactorial, secondary to sepsis with septic shock and persistent hypotension, dehydration, worsening renal function and electrolyte and metabolic derangement as mentioned above -Improved with hemodialysis; and subsequently demonstrating further decline. -patient is still  no eating/drinking, unable to follow commands and nonverbal. -CT scan demonstrating no acute intracranial normalities; given prolonged episodes of hypotension there is concern for hypoxic injury. -Further discussion with family members has triggered decision for full comfort care with symptomatic management only.   10) Diarrhea-- C. diff and GI pathogen/culture is Negative -Rectal tube placed and IV fluids provided -No further labs anticipated and patient's care has been transitioned to full comfort. -diarrhea continue; even slight improvement in quantity reported. Marland Kitchen    11) severe protein calorie malnutrition -In the setting of acute illness and inability to take adequate nutrition/hydration. -Low albumin appreciated in her labs. -Family in agreement not to pursued artificial nutrition and to focus on comfort care.    12-thrombocytopenia: -multifactorial -DVT prophylaxis discontinue at this moment -continue full comfort care and symptomatic management.   Disposition/Need for in-Hospital Stay- patient unable to be discharged at this time due to -Persistent hypotension, no eating, no drinking, non-verbal and now with further worsening of renal function and decreased urine output. -Pending discussion with patient's whole family at bedside who agrees at this moment to transition to full comfort care. -good candidate inpatient hospice; [facility] made aware.    Discharge Instructions   Allergies as of 11/01/2021   No Known Allergies      Medication List     STOP taking these medications    (feeding supplement) PROSource Plus liquid   folic acid 1 MG tablet Commonly known as: FOLVITE   magnesium oxide 400 (240 Mg) MG tablet Commonly known as: MAG-OX   multivitamin with minerals Tabs tablet   nystatin powder Commonly known as: MYCOSTATIN/NYSTOP   ondansetron 4 MG tablet Commonly known as: ZOFRAN   pantoprazole 40 MG tablet Commonly known as: PROTONIX   sucralfate 1 g  tablet Commonly known as: CARAFATE   thiamine 100 MG tablet   traMADol 50 MG tablet Commonly known as: Production manager information for  after-discharge care     [place] .   Service: Inpatient Hospice Contact information: [address] [phone]                    No Known Allergies  Consultations: Renal palliative   Procedures/Studies: CT ABDOMEN PELVIS WO CONTRAST  Result Date: 10/29/2021 CLINICAL DATA:  Sepsis EXAM: CT ABDOMEN AND PELVIS WITHOUT CONTRAST TECHNIQUE: Multidetector CT imaging of the abdomen and pelvis was performed following the standard protocol without IV contrast. COMPARISON:  08/26/2021 FINDINGS: Lower chest: Elevation of the right hemidiaphragm. Right base atelectasis or infiltrate. Left lung base clear. Hepatobiliary: Small layering gallstones within the gallbladder. Mild distention of the gallbladder. No focal hepatic abnormality. Pancreas: No focal abnormality or ductal dilatation. Spleen: No focal abnormality.  Normal size. Adrenals/Urinary Tract: Stable left adrenal adenoma. Dysplastic right kidney. No stones or hydronephrosis on the left. No renal mass. Foley catheter present in the bladder which is decompressed. Stomach/Bowel: Normal appendix. Sigmoid diverticulosis. No active diverticulitis. Stomach and small bowel decompressed, unremarkable. Vascular/Lymphatic: No evidence of aneurysm or adenopathy. Reproductive: Uterus and adnexa unremarkable.  No mass. Other: No free fluid or free air. Musculoskeletal: No acute bony abnormality. IMPRESSION: Elevation of the right hemidiaphragm with right base atelectasis or infiltrate. Sigmoid diverticulosis.  No active diverticulitis. Cholelithiasis. Gallbladder mildly distended. This could be further evaluated with ultrasound if felt clinically indicated. Electronically Signed   By: Rolm Baptise M.D.   On: 10/29/2021 22:11    CT HEAD WO CONTRAST (5MM)  Result Date: 11/05/2021 CLINICAL DATA:  Mental status changes, combative EXAM: CT HEAD WITHOUT CONTRAST TECHNIQUE: Contiguous axial images were obtained from the base of the skull through the vertex without intravenous contrast. COMPARISON:  02/13/2021 FINDINGS: Brain: Limited with some motion artifact. No acute intracranial hemorrhage, mass lesion, new infarction, midline shift, herniation, or extra-axial fluid collection. No focal mass effect or edema. Cisterns are patent. No cerebellar abnormality. Vascular: No hyperdense vessel or unexpected calcification. Skull: Similar abnormal thickened appearance of the skull involving the skull base and maxilla with ground-glass density suggesting fibrous dysplasia. Sinuses/Orbits: No acute finding. Other: None. IMPRESSION: No acute intracranial abnormality. 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 Electronically Signed   By: Jerilynn Mages.  Shick M.D.   On: 11/05/2021 13:43   DG CHEST PORT 1 VIEW  Result Date: 11/03/2021 CLINICAL DATA:  Shortness of breath EXAM: PORTABLE CHEST 1 VIEW COMPARISON:  10/30/2021 FINDINGS: Low lung volumes with minimal right base atelectasis. Bilateral internal jugular central line remain in place, unchanged. Heart is normal size. No effusions. No acute bony abnormality. Degenerative changes in the shoulders and thoracic spine. IMPRESSION: Low lung volumes.  Minimal right base atelectasis. Electronically Signed   By: Rolm Baptise M.D.   On: 11/03/2021 10:20   DG Chest Port 1 View  Result Date: 10/30/2021 CLINICAL DATA:  Dialysis catheter placement EXAM: PORTABLE CHEST 1 VIEW COMPARISON:  Yesterday FINDINGS: Left internal jugular line tip at high right atrium. Placement of a right internal jugular dialysis catheter with tip at mid right atrium. Midline trachea. Mild cardiomegaly. No pleural effusion or pneumothorax. Lung volumes are low. Moderate right  hemidiaphragm elevation with mild volume loss in the adjacent right lung base. Minimal subsegmental atelectasis at the left lung base. IMPRESSION: No pneumothorax after right internal jugular line dialysis catheter placement. Cardiomegaly without congestive failure. Electronically Signed   By: Marylyn Ishihara  Jobe Igo M.D.   On: 10/30/2021 13:12   DG Chest Portable 1 View  Result Date: 10/29/2021 CLINICAL DATA:  IJ line placement EXAM: PORTABLE CHEST 1 VIEW COMPARISON:  10/29/2021 FINDINGS: Left internal jugular central line is in place with the tip at the cavoatrial junction. Elevation of the right hemidiaphragm with right base atelectasis/infiltrate. Left lung clear. No effusions or pneumothorax. No acute bony abnormality. IMPRESSION: Left central line tip at the cavoatrial junction.  No pneumothorax. Stable elevation of the right hemidiaphragm with right base atelectasis or infiltrate. Electronically Signed   By: Rolm Baptise M.D.   On: 10/29/2021 22:06   DG Chest Portable 1 View  Result Date: 10/29/2021 CLINICAL DATA:  Hypotension.  Abnormal labs. EXAM: PORTABLE CHEST 1 VIEW COMPARISON:  Chest radiograph dated August 26, 2021 FINDINGS: The heart size and mediastinal contours are within normal limits. Low lung volumes. Right basilar atelectasis/infiltrate. Advanced bilateral glenohumeral osteoarthritis. IMPRESSION: 1.  Low lung volumes with right basilar atelectasis/infiltrate. 2.  Advanced chronic bilateral glenohumeral osteoarthritis. Electronically Signed   By: Keane Police D.O.   On: 10/29/2021 18:18   US BREAST LTD UNI LEFT INC AXILLA  Result Date: 10/27/2021 CLINICAL DATA:  55 year old female with nipple discharge. Per the patient this discharge occurs on the left, has occurred for many years and is usually white in color. 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 EXAM: DIGITAL DIAGNOSTIC BILATERAL MAMMOGRAM WITH TOMOSYNTHESIS AND CAD; ULTRASOUND LEFT  BREAST LIMITED TECHNIQUE: Bilateral digital diagnostic mammography and breast tomosynthesis was performed. The images were evaluated with computer-aided detection.; Targeted ultrasound examination of the left breast was performed. COMPARISON:  Previous exams. ACR Breast Density Category a: The breast tissue is almost entirely fatty. FINDINGS: No suspicious masses or calcifications seen in either breast. There is left retroareolar deformity with associated density and distortion, slightly decrease in appearance when compared to prior mammograms from 2019. no new abnormality seen in the left breast. Physical examination of the left breast reveals thickening of the areola with areas of scarring. No nipple discharge elicited. Targeted ultrasound of the retroareolar left breast was performed. No suspicious masses or abnormality seen. No intraductal masses or fluid collections. IMPRESSION: 1.  No findings of malignancy in either breast. 2. The mammographic appearance the left breast is overall stable and chronic left nipple discharge is felt to be physiologic versus waxing and waning infection. No nipple discharge or signs of breast infection observed on today's exam. RECOMMENDATION: 1. Recommend further management of chronic left nipple discharge be based on clinical assessment. 2.  Screening mammogram in one year.(Code:SM-B-01Y) I have discussed the findings and recommendations with the patient. If applicable, a reminder letter will be sent to the patient regarding the next appointment. BI-RADS CATEGORY  1: Negative. Electronically Signed   By: Everlean Alstrom M.D.   On: 10/27/2021 12:09  MM DIAG BREAST TOMO BILATERAL  Result Date: 10/27/2021 CLINICAL DATA:  55 year old female with nipple discharge. Per the patient this discharge occurs on the left, has occurred for many years and is usually white in color. 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 EXAM: DIGITAL  DIAGNOSTIC BILATERAL MAMMOGRAM WITH TOMOSYNTHESIS AND CAD; ULTRASOUND LEFT BREAST LIMITED TECHNIQUE: Bilateral digital diagnostic mammography and breast tomosynthesis was performed. The images were evaluated with computer-aided detection.; Targeted ultrasound examination of the left breast was performed. COMPARISON:  Previous exams. ACR Breast Density Category a: The breast tissue is almost  entirely fatty. FINDINGS: No suspicious masses or calcifications seen in either breast. There is left retroareolar deformity with associated density and distortion, slightly decrease in appearance when compared to prior mammograms from 2019. no new abnormality seen in the left breast. Physical examination of the left breast reveals thickening of the areola with areas of scarring. No nipple discharge elicited. Targeted ultrasound of the retroareolar left breast was performed. No suspicious masses or abnormality seen. No intraductal masses or fluid collections. IMPRESSION: 1.  No findings of malignancy in either breast. 2. The mammographic appearance the left breast is overall stable and chronic left nipple discharge is felt to be physiologic versus waxing and waning infection. No nipple discharge or signs of breast infection observed on today's exam. RECOMMENDATION: 1. Recommend further management of chronic left nipple discharge be based on clinical assessment. 2.  Screening mammogram in one year.(Code:SM-B-01Y) I have discussed the findings and recommendations with the patient. If applicable, a reminder letter will be sent to the patient regarding the next appointment. BI-RADS CATEGORY  1: Negative. Electronically Signed   By: Everlean Alstrom M.D.   On: 10/27/2021 12:09        Discharge Exam: Vitals:   11/09/21 2211 11/07/2021 0605  BP: 98/64 (!) 88/49  Pulse: (!) 110 (!) 118  Resp:    Temp: (!) 97.5 F (36.4 C) (!) 97.4 F (36.3 C)  SpO2: 100% 91%   Vitals:   11/08/21 2146 11/09/21 1600 11/09/21 2211 11/04/2021  0605  BP: (!) 113/91  98/64 (!) 88/49  Pulse: 80  (!) 110 (!) 118  Resp: 18 (!) 8    Temp: 98 F (36.7 C)  (!) 97.5 F (36.4 C) (!) 97.4 F (36.3 C)  TempSrc:   Oral Oral  SpO2: 93%  100% 91%  Weight:      Height:        General: Pt is somnolent.  No distress Cardiovascular: RRR, S1/S2 +, no rubs, no gallops Respiratory: bilateral rales Abdominal: Soft ND, bowel sounds + Extremities: 1 + LE edema, no cyanosis   The results of significant diagnostics from this hospitalization (including imaging, microbiology, ancillary and laboratory) are listed below for reference.    Significant Diagnostic Studies: CT ABDOMEN PELVIS WO CONTRAST  Result Date: 10/29/2021 CLINICAL DATA:  Sepsis EXAM: CT ABDOMEN AND PELVIS WITHOUT CONTRAST TECHNIQUE: Multidetector CT imaging of the abdomen and pelvis was performed following the standard protocol without IV contrast. COMPARISON:  08/26/2021 FINDINGS: Lower chest: Elevation of the right hemidiaphragm. Right base atelectasis or infiltrate. Left lung base clear. Hepatobiliary: Small layering gallstones within the gallbladder. Mild distention of the gallbladder. No focal hepatic abnormality. Pancreas: No focal abnormality or ductal dilatation. Spleen: No focal abnormality.  Normal size. Adrenals/Urinary Tract: Stable left adrenal adenoma. Dysplastic right kidney. No stones or hydronephrosis on the left. No renal mass. Foley catheter present in the bladder which is decompressed. Stomach/Bowel: Normal appendix. Sigmoid diverticulosis. No active diverticulitis. Stomach and small bowel decompressed, unremarkable. Vascular/Lymphatic: No evidence of aneurysm or adenopathy. Reproductive: Uterus and adnexa unremarkable.  No mass. Other: No free fluid or free air. Musculoskeletal: No acute bony abnormality. IMPRESSION: Elevation of the right hemidiaphragm with right base atelectasis or infiltrate. Sigmoid diverticulosis.  No active diverticulitis. Cholelithiasis.  Gallbladder mildly distended. This could be further evaluated with ultrasound if felt clinically indicated. Electronically Signed   By: Rolm Baptise M.D.   On: 10/29/2021 22:11   CT HEAD WO CONTRAST (5MM)  Result Date: 11/05/2021 CLINICAL DATA:  Mental  status changes, combative EXAM: CT HEAD WITHOUT CONTRAST TECHNIQUE: Contiguous axial images were obtained from the base of the skull through the vertex without intravenous contrast. COMPARISON:  02/13/2021 FINDINGS: Brain: Limited with some motion artifact. No acute intracranial hemorrhage, mass lesion, new infarction, midline shift, herniation, or extra-axial fluid collection. No focal mass effect or edema. Cisterns are patent. No cerebellar abnormality. Vascular: No hyperdense vessel or unexpected calcification. Skull: Similar abnormal thickened appearance of the skull involving the skull base and maxilla with ground-glass density suggesting fibrous dysplasia. Sinuses/Orbits: No acute finding. Other: None. IMPRESSION: No acute intracranial abnormality. 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 Electronically Signed   By: Jerilynn Mages.  Shick M.D.   On: 11/05/2021 13:43   DG CHEST PORT 1 VIEW  Result Date: 11/03/2021 CLINICAL DATA:  Shortness of breath EXAM: PORTABLE CHEST 1 VIEW COMPARISON:  10/30/2021 FINDINGS: Low lung volumes with minimal right base atelectasis. Bilateral internal jugular central line remain in place, unchanged. Heart is normal size. No effusions. No acute bony abnormality. Degenerative changes in the shoulders and thoracic spine. IMPRESSION: Low lung volumes.  Minimal right base atelectasis. Electronically Signed   By: Rolm Baptise M.D.   On: 11/03/2021 10:20   DG Chest Port 1 View  Result Date: 10/30/2021 CLINICAL DATA:  Dialysis catheter placement EXAM: PORTABLE CHEST 1 VIEW COMPARISON:  Yesterday FINDINGS: Left internal jugular line tip at high right atrium. Placement of a right  internal jugular dialysis catheter with tip at mid right atrium. Midline trachea. Mild cardiomegaly. No pleural effusion or pneumothorax. Lung volumes are low. Moderate right hemidiaphragm elevation with mild volume loss in the adjacent right lung base. Minimal subsegmental atelectasis at the left lung base. IMPRESSION: No pneumothorax after right internal jugular line dialysis catheter placement. Cardiomegaly without congestive failure. Electronically Signed   By: Abigail Miyamoto M.D.   On: 10/30/2021 13:12   DG Chest Portable 1 View  Result Date: 10/29/2021 CLINICAL DATA:  IJ line placement EXAM: PORTABLE CHEST 1 VIEW COMPARISON:  10/29/2021 FINDINGS: Left internal jugular central line is in place with the tip at the cavoatrial junction. Elevation of the right hemidiaphragm with right base atelectasis/infiltrate. Left lung clear. No effusions or pneumothorax. No acute bony abnormality. IMPRESSION: Left central line tip at the cavoatrial junction.  No pneumothorax. Stable elevation of the right hemidiaphragm with right base atelectasis or infiltrate. Electronically Signed   By: Rolm Baptise M.D.   On: 10/29/2021 22:06   DG Chest Portable 1 View  Result Date: 10/29/2021 CLINICAL DATA:  Hypotension.  Abnormal labs. EXAM: PORTABLE CHEST 1 VIEW COMPARISON:  Chest radiograph dated August 26, 2021 FINDINGS: The heart size and mediastinal contours are within normal limits. Low lung volumes. Right basilar atelectasis/infiltrate. Advanced bilateral glenohumeral osteoarthritis. IMPRESSION: 1.  Low lung volumes with right basilar atelectasis/infiltrate. 2.  Advanced chronic bilateral glenohumeral osteoarthritis. Electronically Signed   By: Keane Police D.O.   On: 10/29/2021 18:18   US BREAST LTD UNI LEFT INC AXILLA  Result Date: 10/27/2021 CLINICAL DATA:  55 year old female with nipple discharge. Per the patient this discharge occurs on the left, has occurred for many years and is usually white in color. The  patient does have history of chronic left breast abscess post abscess drainage and biopsy. EXAM: DIGITAL DIAGNOSTIC BILATERAL MAMMOGRAM WITH TOMOSYNTHESIS AND CAD; ULTRASOUND LEFT BREAST LIMITED TECHNIQUE: Bilateral digital diagnostic mammography and breast tomosynthesis was performed. The images were evaluated with computer-aided detection.; Targeted ultrasound examination of the  left breast was performed. COMPARISON:  Previous exams. ACR Breast Density Category a: The breast tissue is almost entirely fatty. FINDINGS: No suspicious masses or calcifications seen in either breast. There is left retroareolar deformity with associated density and distortion, slightly decrease in appearance when compared to prior mammograms from 2019. no new abnormality seen in the left breast. Physical examination of the left breast reveals thickening of the areola with areas of scarring. No nipple discharge elicited. Targeted ultrasound of the retroareolar left breast was performed. No suspicious masses or abnormality seen. No intraductal masses or fluid collections. IMPRESSION: 1.  No findings of malignancy in either breast. 2. The mammographic appearance the left breast is overall stable and chronic left nipple discharge is felt to be physiologic versus waxing and waning infection. No nipple discharge or signs of breast infection observed on today's exam. RECOMMENDATION: 1. Recommend further management of chronic left nipple discharge be based on clinical assessment. 2.  Screening mammogram in one year.(Code:SM-B-01Y) I have discussed the findings and recommendations with the patient. If applicable, a reminder letter will be sent to the patient regarding the next appointment. BI-RADS CATEGORY  1: Negative. Electronically Signed   By: Everlean Alstrom M.D.   On: 10/27/2021 12:09  MM DIAG BREAST TOMO BILATERAL  Result Date: 10/27/2021 CLINICAL DATA:  55 year old female with nipple discharge. Per the patient this discharge occurs  on the left, has occurred for many years and is usually white in color. 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 EXAM: DIGITAL DIAGNOSTIC BILATERAL MAMMOGRAM WITH TOMOSYNTHESIS AND CAD; ULTRASOUND LEFT BREAST LIMITED TECHNIQUE: Bilateral digital diagnostic mammography and breast tomosynthesis was performed. The images were evaluated with computer-aided detection.; Targeted ultrasound examination of the left breast was performed. COMPARISON:  Previous exams. ACR Breast Density Category a: The breast tissue is almost entirely fatty. FINDINGS: No suspicious masses or calcifications seen in either breast. There is left retroareolar deformity with associated density and distortion, slightly decrease in appearance when compared to prior mammograms from 2019. no new abnormality seen in the left breast. Physical examination of the left breast reveals thickening of the areola with areas of scarring. No nipple discharge elicited. Targeted ultrasound of the retroareolar left breast was performed. No suspicious masses or abnormality seen. No intraductal masses or fluid collections. IMPRESSION: 1.  No findings of malignancy in either breast. 2. The mammographic appearance the left breast is overall stable and chronic left nipple discharge is felt to be physiologic versus waxing and waning infection. No nipple discharge or signs of breast infection observed on today's exam. RECOMMENDATION: 1. Recommend further management of chronic left nipple discharge be based on clinical assessment. 2.  Screening mammogram in one year.(Code:SM-B-01Y) I have discussed the findings and recommendations with the patient. If applicable, a reminder letter will be sent to the patient regarding the next appointment. BI-RADS CATEGORY  1: Negative. Electronically Signed   By: Everlean Alstrom M.D.   On: 10/27/2021 12:09    Microbiology: No results found for this or any previous visit (from the  past 240 hour(s)).   Labs: Basic Metabolic Panel: Recent Labs  Lab 11/05/21 0935 11/06/21 0431 11/07/21 0503 11/08/21 0527  NA 136 135 139 134*  K 3.2* 2.9* 2.6* 2.3*  CL 111 109 113* 109  CO2 16* 17* 17* 14*  GLUCOSE 119* 169* 198* 165*  BUN 28* 27* 30* 33*  CREATININE 3.34* 3.41* 3.52* 3.75*  CALCIUM 7.8* 7.8* 8.2* 7.7*  PHOS  2.2*  --  2.8 2.9   Liver Function Tests: Recent Labs  Lab 11/05/21 0935 11/07/21 0503 11/08/21 0527  ALBUMIN 1.6* 1.6* 1.8*   No results for input(s): LIPASE, AMYLASE in the last 168 hours. No results for input(s): AMMONIA in the last 168 hours. CBC: Recent Labs  Lab 11/05/21 0935 11/06/21 0431 11/06/21 1110 11/08/21 0527  WBC 21.9* 19.5*  --  22.2*  NEUTROABS 17.6*  --   --   --   HGB 7.1* 7.3*  --  6.6*  HCT 22.1* 22.3*  --  19.7*  MCV 84.4 83.8  --  82.4  PLT 85* 90* 107* 78*   Cardiac Enzymes: No results for input(s): CKTOTAL, CKMB, CKMBINDEX, TROPONINI in the last 168 hours. BNP: Invalid input(s): POCBNP CBG: Recent Labs  Lab 11/07/21 1205 11/07/21 1618 11/07/21 2127 11/08/21 0728 11/08/21 1103  GLUCAP 189* 161* 138* 134* 123*    Time coordinating discharge:  36 minutes  Signed:  Orson Eva, DO [facility] Pager: [phone] 10/26/2021, 3:41 PM

## 2021-11-11 NOTE — Progress Notes (Signed)
Palliative: Courtney Dyer is lying quietly in bed.  She is a comfort care patient and looks comfortable.  She will briefly open her eyes when I touch her but not make eye contact or try to interact with me in any meaningful way.  She is clearly unable to make her needs known.  Friends are at bedside.  We talked about Courtney Dyer' comfort.  They denied needs at this time.  I encouraged friends to reach out to staff for needs.  Courtney Dyer has been accepted by [facility] for general inpatient status.    Conference with attending, bedside nursing staff, transition of care team related to patient condition, needs, goals of care, disposition.  Plan:  Comfort and dignity at end-of-life, GIP/residential hospice status.  End-of-life order set in place.  DNR on chart.  25 minutes Quinn Axe, NP Palliative medicine team Team phone [phone] Greater than 50% of this time was spent counseling and coordinating care related to the above assessment and plan.

## 2021-11-11 NOTE — TOC Progression Note (Signed)
Transition of Care [facility]) - Progression Note    Patient Details  Name: Courtney Dyer MRN: [id number] Date of Birth: 1966/06/21  Transition of Care [facility] CM/SW Contact  Boneta Lucks, RN Phone Number: 11/10/2021, 12:00 PM  Clinical Narrative:   [facility] accepted. They have no beds. Patient will be made GIP. RN/MD updated.    Expected Discharge Plan: Skilled Nursing Facility Barriers to Discharge: Other (must enter comment) (NO Hospice beds/  GIP)  Expected Discharge Plan and Services Expected Discharge Plan: [place]

## 2021-11-11 NOTE — Progress Notes (Signed)
Received call from pt's room from family present at bedside stating pt was "choking". In to room to note that pt has gurgling respirations at 14/min. HOB raised slightly and oral suctioning provided. Family member very upset, states pt is choking and no one is helping her. Gently provided education on respiratory changes with death and presence of "death rattles". Advised that MD has ordered medication specifically for the increased secretions and all family members agreeable to administration. Robinul administered per order.    Pt then state they are upset regarding wounds on pt's buttocks, showing this nurse pictures of pt's posterior buttocks and thighs. Advised that we would assess these areas and provide wound care.  This nurse, Music therapist in to talk with family members and to provide wound care. Pt turned and noted to have large denuded areas on both posterior thighs and buttocks with fresh bleeding. Pt also has multiple small denuded areas on buttocks, inner thighs and left lower abd, along with serous blisters noted on hips and abd. Denuded areas gently cleaned with saline, dressed with xerofoam gauze and abd pads, then silicon foam dressings used to secure to prevent using tape on skin and causing further damage to skin. Serous blisters covered with silicon foam dressings to protect.  Pericare performed, oral suctioning & oral care performed. Heels floated on pillows, arms/hands elevated on pillows. Mitts removed as pt is no longer pulling at lines.  Advised family to call if any further concerns or needs and they state understanding.

## 2021-11-12 DIAGNOSIS — Z515 Encounter for palliative care: Secondary | ICD-10-CM

## 2021-11-12 DIAGNOSIS — G9341 Metabolic encephalopathy: Secondary | ICD-10-CM

## 2021-11-12 DIAGNOSIS — N1832 Chronic kidney disease, stage 3b: Secondary | ICD-10-CM

## 2021-11-12 DIAGNOSIS — N179 Acute kidney failure, unspecified: Secondary | ICD-10-CM

## 2021-11-12 DIAGNOSIS — N390 Urinary tract infection, site not specified: Secondary | ICD-10-CM

## 2021-11-12 DIAGNOSIS — A419 Sepsis, unspecified organism: Principal | ICD-10-CM

## 2021-11-12 NOTE — Plan of Care (Signed)
Pt dressings changed with family at bedside, bathed, and bed linen and gown change.

## 2021-11-12 NOTE — Progress Notes (Signed)
PROGRESS NOTE  Courtney Dyer:[id number] DOB: 03-04-1966 DOA: 11/09/2021 PCP: Pcp, No  Brief History:   55 y.o. female with medical history significant for hyperlipidemia, T2DM, gout, GERD , HTN, CKD IV admitted on 10/30/2021 with AKI on CKD stage IV, severe electrolyte derangement, and persistent  severe dehydration partly due to persistent nausea and vomiting with persistent hypotension requiring IV fluids on IV Levophed for pressure support in the setting of sepsis and dehydration.  Urinalysis showed greater than 50 WBC.  Although urine culture and blood cultures were negative, the patient was treated empirically with cefepime and vancomycin.  Vancomycin was subsequently switched over to linezolid due to patient's worsening renal function.  The patient finished 10 days of empiric antibiotic therapy.  She was ultimately weaned off of levophed.  However, the patient's renal function continued to worsen.  Nephrology was consulted.  Hemodialysis was initiated 10/30/2021.  She was also dialyzed on 10/31/2021.  She was subsequently monitored off of HD.  Unfortunately, the patient continued to worsen clinically.  Her renal function continued to worsen again.   Patient was treated with IV fluids and albumin.  Her urine output continued to decline. Nephrology felt that patient was a poor HD candidate.   Palliative medicine was consulted.  Goals of care discussion was held.  The patient continued to be lethargic without much improvement in her mental status.  Her renal function did not show significant improvement.  In fact, her renal function continued to worsen again after 2 sessions of dialysis.  Her oral intake was poor.  After goals of therapy, family did not want to pursue any further RRT.  Ultimately, the patient was transitioned to comfort care measures only.  Social work assisted in transitioning patient to residential hospice.  Because there were no beds available, the patient was  transition to general inpatient hospice admission.  Full comfort measures were continued.  Assessment/Plan: 1)Persistent Hypotension/hemodynamic instability--- in the setting of dehydration and presumed sepsis -Continues to have low BP and has failed to respond to aggressive management.  -Completed tx with antibiotics; has remained afebrile.  -non-verbal, not following commands, not eating and with steadily worsening renal function. -Long goals of care discussion at bedside; family had decided patient to be DNR/DNI with transition to comfort care management.  -Overall patient is not a candidate for long-term hemodialysis (as discussed with Dr. Candiss Norse and [facility] nephrology). -No growth ever appreciated from her blood, urine and GI cultures. -Continue to focus on comfort care. -Hospice evaluated patient and patient was transitioned to GIP with continued full comfort measures.   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   creatinine was around 10, BUN around 160 -Scr trending up 3.8  -Urine output has dropped significantly and fell to improve by receiving diuretics, IV fluids and albumin.   -Discussion with nephrology service sustained expressing pt not good candidate for hemodialysis or CVVH at this point. -Family in acceptance and looking for full comfort care and symptomatic management only. -Stopping blood work/radiographic studies moving forward.   3) acute on chronic anemia--- no evidence of ongoing bleeding suspect some component of chronic anemia of CKD -Serum iron is 81 iron saturation  71, ferritin is not low,  -B12 and folate are stable. -Patient has received a total  of 2 units of PRBC during this admission. -Last hemoglobin 6.6 on 12/25 blood work; after discussing with family and transitioning to full comfort care only no transfusion will be provided. -ESA treatment at this moment discontinued. -Prior bleeding appreciated. -no further blood drawn planned; continue comfort care.   4)Chronic breast wound--- does not appear infected, Healing left breast periareolar wound--please see ultrasound report from 10/27/2021. -Continue wound care and symptomatic management..   5)Hypokalemia/hyponatremia/hypophosphatemia--electrolyte derangement due to persistent diarrhea and poor oral intake. --No further blood work to follow electrolytes trend anticipated --Patient's care has been transition to full comfort.   6)DM2- A1C 5.2 reflecting excellent diabetic control PTA -Transitioning to full comfort care. -Stopping checking CBGs or providing sliding scale insulin at this point.   7)Social/Ethics--patient's aunt [name] is primary contact and decision maker [phone]) -Goals of care discussion decision has been made for DNR/DNI -Overall prognosis is is poor and hospital death is expected -Palliative care has been consulted and will follow any further recommendations for symptomatic care.. -Further discussion with family members who agree with transition to full comfort care. -Patient is a good candidate for inpatient hospice.   8)Sepsis with Septic Shock--- related to UTI and pneumonia -PCT 0.66 -WBC has trended down appropriately overall but remains elevated -Culture data and antibiotics as above #1 -weaned off Levophed. -Patient has remained afebrile and completed antibiotic therapy. -Currently transitioning to full comfort/symptomatic management only.   9) Acute metabolic encephalopathy: Multifactorial, secondary to sepsis with septic shock and persistent hypotension, dehydration, worsening renal function and electrolyte and metabolic  derangement as mentioned above -Improved with hemodialysis; and subsequently demonstrating further decline. -patient is still no eating/drinking, unable to follow commands and nonverbal. -CT scan demonstrating no acute intracranial normalities; given prolonged episodes of hypotension there is concern for hypoxic injury. -Further discussion with family members has triggered decision for full comfort care with symptomatic management only.   10) Diarrhea-- C. diff and GI pathogen/culture is Negative -Rectal tube placed and IV fluids provided -No further labs anticipated and patient's care has been transitioned to full comfort. -diarrhea continue; even slight improvement in quantity reported. Marland Kitchen    11) severe protein calorie malnutrition -In the setting of acute illness and inability to take adequate nutrition/hydration. -Low albumin appreciated in her labs. -Family in agreement not to pursued artificial nutrition and to focus on comfort care.    12-thrombocytopenia: -multifactorial -DVT prophylaxis discontinue at this moment -continue full comfort care and symptomatic management.   Disposition/Need for in-Hospital Stay- patient unable to be discharged at this time due to -Persistent hypotension, with poor to no oral intake.  She is now non-verbal and now with further worsening of renal function and decreased urine output. -After GOC discussions with patient's whole family, they agree to transition to full comfort care. [facility] hospice accepted the patient -due to lack of bed availability and hemodynamic instability, the patient was transitioned to general inpatient hospice admission -full comfort measures were continued -daily communication with family continued throughout her GIP stay with anticipatory guidance.          Family Communication:   Family at bedside updated 12/29  Consultants:  renal, palliative  Code Status:  FULL COMFORT  DVT Prophylaxis:  FULL  COMFORT   Procedures: As Listed in Progress Note Above         Subjective: Patient resting comfortably without distress.  No overnight events.  No vomiting, resp distress or uncontrolled pain  Objective:  Exam:  General:  Pt unresponsive to protopathic stimuli.  No distress HEENT: No icterus, [place]/AT Cardiovascular: RRR,  Respiratory: bilateral rhonchi Abdomen: Soft/+BS, non distended, no guarding Extremities: 1 + LE edema, No lymphangitis   Data Reviewed: I have personally reviewed following labs and imaging studies Basic Metabolic Panel: Recent Labs  Lab 11/05/21 0935 11/06/21 0431 11/07/21 0503 11/08/21 0527  NA 136 135 139 134*  K 3.2* 2.9* 2.6* 2.3*  CL 111 109 113* 109  CO2 16* 17* 17* 14*  GLUCOSE 119* 169* 198* 165*  BUN 28* 27* 30* 33*  CREATININE 3.34* 3.41* 3.52* 3.75*  CALCIUM 7.8* 7.8* 8.2* 7.7*  PHOS 2.2*  --  2.8 2.9   Liver Function Tests: Recent Labs  Lab 11/05/21 0935 11/07/21 0503 11/08/21 0527  ALBUMIN 1.6* 1.6* 1.8*   No results for input(s): LIPASE, AMYLASE in the last 168 hours. No results for input(s): AMMONIA in the last 168 hours. Coagulation Profile: Recent Labs  Lab 11/06/21 1110  INR 1.8*   CBC: Recent Labs  Lab 11/05/21 0935 11/06/21 0431 11/06/21 1110 11/08/21 0527  WBC 21.9* 19.5*  --  22.2*  NEUTROABS 17.6*  --   --   --   HGB 7.1* 7.3*  --  6.6*  HCT 22.1* 22.3*  --  19.7*  MCV 84.4 83.8  --  82.4  PLT 85* 90* 107* 78*   Cardiac Enzymes: No results for input(s): CKTOTAL, CKMB, CKMBINDEX, TROPONINI in the last 168 hours. BNP: Invalid input(s): POCBNP CBG: Recent Labs  Lab 11/07/21 1205 11/07/21 1618 11/07/21 2127 11/08/21 0728 11/08/21 1103  GLUCAP 189* 161* 138* 134* 123*   HbA1C: No results for input(s): HGBA1C in the last 72 hours. Urine analysis:    Component Value Date/Time   COLORURINE GREEN (A) 10/29/2021 2005   APPEARANCEUR CLOUDY (A) 10/29/2021 2005   LABSPEC 1.020 10/29/2021 2005    PHURINE 6.0 10/29/2021 2005   GLUCOSEU NEGATIVE 10/29/2021 2005   HGBUR SMALL (A) 10/29/2021 2005   BILIRUBINUR NEGATIVE 10/29/2021 2005   KETONESUR 20 (A) 10/29/2021 2005   PROTEINUR 100 (A) 10/29/2021 2005   NITRITE POSITIVE (A) 10/29/2021 2005   LEUKOCYTESUR LARGE (A) 10/29/2021 2005   Sepsis Labs: @LABRCNTIP (procalcitonin:4,lacticidven:4) )No results found for this or any previous visit (from the past 240 hour(s)).   Scheduled Meds: Continuous Infusions:  Procedures/Studies: CT ABDOMEN PELVIS WO CONTRAST  Result Date: 10/29/2021 CLINICAL DATA:  Sepsis EXAM: CT ABDOMEN AND PELVIS WITHOUT CONTRAST TECHNIQUE: Multidetector CT imaging of the abdomen and pelvis was performed following the standard protocol without IV contrast. COMPARISON:  08/26/2021 FINDINGS: Lower chest: Elevation of the right hemidiaphragm. Right base atelectasis or infiltrate. Left lung base clear. Hepatobiliary: Small layering gallstones within the gallbladder. Mild distention of the gallbladder. No focal hepatic abnormality. Pancreas: No focal abnormality or ductal dilatation. Spleen: No focal abnormality.  Normal size. Adrenals/Urinary Tract: Stable left adrenal adenoma. Dysplastic right kidney. No stones or hydronephrosis on the left. No renal mass. Foley catheter present in the bladder which is decompressed. Stomach/Bowel: Normal appendix. Sigmoid diverticulosis. No active diverticulitis. Stomach and small bowel decompressed, unremarkable. Vascular/Lymphatic: No evidence of aneurysm or adenopathy. Reproductive: Uterus and adnexa unremarkable.  No mass. Other: No free fluid or free air. Musculoskeletal: No acute bony abnormality. IMPRESSION: Elevation of the right hemidiaphragm with right base atelectasis or infiltrate. Sigmoid diverticulosis.  No active diverticulitis. Cholelithiasis. Gallbladder mildly distended. This could be further evaluated with ultrasound if  felt clinically indicated. Electronically Signed   By:  Rolm Baptise M.D.   On: 10/29/2021 22:11   CT HEAD WO CONTRAST (5MM)  Result Date: 11/05/2021 CLINICAL DATA:  Mental status changes, combative EXAM: CT HEAD WITHOUT CONTRAST TECHNIQUE: Contiguous axial images were obtained from the base of the skull through the vertex without intravenous contrast. COMPARISON:  02/13/2021 FINDINGS: Brain: Limited with some motion artifact. No acute intracranial hemorrhage, mass lesion, new infarction, midline shift, herniation, or extra-axial fluid collection. No focal mass effect or edema. Cisterns are patent. No cerebellar abnormality. Vascular: No hyperdense vessel or unexpected calcification. Skull: Similar abnormal thickened appearance of the skull involving the skull base and maxilla with ground-glass density suggesting fibrous dysplasia. Sinuses/Orbits: No acute finding. Other: None. IMPRESSION: No acute intracranial abnormality. 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 Electronically Signed   By: Jerilynn Mages.  Shick M.D.   On: 11/05/2021 13:43   DG CHEST PORT 1 VIEW  Result Date: 11/03/2021 CLINICAL DATA:  Shortness of breath EXAM: PORTABLE CHEST 1 VIEW COMPARISON:  10/30/2021 FINDINGS: Low lung volumes with minimal right base atelectasis. Bilateral internal jugular central line remain in place, unchanged. Heart is normal size. No effusions. No acute bony abnormality. Degenerative changes in the shoulders and thoracic spine. IMPRESSION: Low lung volumes.  Minimal right base atelectasis. Electronically Signed   By: Rolm Baptise M.D.   On: 11/03/2021 10:20   DG Chest Port 1 View  Result Date: 10/30/2021 CLINICAL DATA:  Dialysis catheter placement EXAM: PORTABLE CHEST 1 VIEW COMPARISON:  Yesterday FINDINGS: Left internal jugular line tip at high right atrium. Placement of a right internal jugular dialysis catheter with tip at mid right atrium. Midline trachea. Mild cardiomegaly. No pleural effusion or pneumothorax. Lung  volumes are low. Moderate right hemidiaphragm elevation with mild volume loss in the adjacent right lung base. Minimal subsegmental atelectasis at the left lung base. IMPRESSION: No pneumothorax after right internal jugular line dialysis catheter placement. Cardiomegaly without congestive failure. Electronically Signed   By: Abigail Miyamoto M.D.   On: 10/30/2021 13:12   DG Chest Portable 1 View  Result Date: 10/29/2021 CLINICAL DATA:  IJ line placement EXAM: PORTABLE CHEST 1 VIEW COMPARISON:  10/29/2021 FINDINGS: Left internal jugular central line is in place with the tip at the cavoatrial junction. Elevation of the right hemidiaphragm with right base atelectasis/infiltrate. Left lung clear. No effusions or pneumothorax. No acute bony abnormality. IMPRESSION: Left central line tip at the cavoatrial junction.  No pneumothorax. Stable elevation of the right hemidiaphragm with right base atelectasis or infiltrate. Electronically Signed   By: Rolm Baptise M.D.   On: 10/29/2021 22:06   DG Chest Portable 1 View  Result Date: 10/29/2021 CLINICAL DATA:  Hypotension.  Abnormal labs. EXAM: PORTABLE CHEST 1 VIEW COMPARISON:  Chest radiograph dated August 26, 2021 FINDINGS: The heart size and mediastinal contours are within normal limits. Low lung volumes. Right basilar atelectasis/infiltrate. Advanced bilateral glenohumeral osteoarthritis. IMPRESSION: 1.  Low lung volumes with right basilar atelectasis/infiltrate. 2.  Advanced chronic bilateral glenohumeral osteoarthritis. Electronically Signed   By: Keane Police D.O.   On: 10/29/2021 18:18   US BREAST LTD UNI LEFT INC AXILLA  Result Date: 10/27/2021 CLINICAL DATA:  55 year old female with nipple discharge. Per the patient this discharge occurs on the left, has occurred for many years and is usually white in color. 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 EXAM: DIGITAL DIAGNOSTIC  BILATERAL MAMMOGRAM WITH  TOMOSYNTHESIS AND CAD; ULTRASOUND LEFT BREAST LIMITED TECHNIQUE: Bilateral digital diagnostic mammography and breast tomosynthesis was performed. The images were evaluated with computer-aided detection.; Targeted ultrasound examination of the left breast was performed. COMPARISON:  Previous exams. ACR Breast Density Category a: The breast tissue is almost entirely fatty. FINDINGS: No suspicious masses or calcifications seen in either breast. There is left retroareolar deformity with associated density and distortion, slightly decrease in appearance when compared to prior mammograms from 2019. no new abnormality seen in the left breast. Physical examination of the left breast reveals thickening of the areola with areas of scarring. No nipple discharge elicited. Targeted ultrasound of the retroareolar left breast was performed. No suspicious masses or abnormality seen. No intraductal masses or fluid collections. IMPRESSION: 1.  No findings of malignancy in either breast. 2. The mammographic appearance the left breast is overall stable and chronic left nipple discharge is felt to be physiologic versus waxing and waning infection. No nipple discharge or signs of breast infection observed on today's exam. RECOMMENDATION: 1. Recommend further management of chronic left nipple discharge be based on clinical assessment. 2.  Screening mammogram in one year.(Code:SM-B-01Y) I have discussed the findings and recommendations with the patient. If applicable, a reminder letter will be sent to the patient regarding the next appointment. BI-RADS CATEGORY  1: Negative. Electronically Signed   By: Everlean Alstrom M.D.   On: 10/27/2021 12:09  MM DIAG BREAST TOMO BILATERAL  Result Date: 10/27/2021 CLINICAL DATA:  55 year old female with nipple discharge. Per the patient this discharge occurs on the left, has occurred for many years and is usually white in color. The patient does have history of chronic left breast abscess post  abscess drainage and biopsy. EXAM: DIGITAL DIAGNOSTIC BILATERAL MAMMOGRAM WITH TOMOSYNTHESIS AND CAD; ULTRASOUND LEFT BREAST LIMITED TECHNIQUE: Bilateral digital diagnostic mammography and breast tomosynthesis was performed. The images were evaluated with computer-aided detection.; Targeted ultrasound examination of the left breast was performed. COMPARISON:  Previous exams. ACR Breast Density Category a: The breast tissue is almost entirely fatty. FINDINGS: No suspicious masses or calcifications seen in either breast. There is left retroareolar deformity with associated density and distortion, slightly decrease in appearance when compared to prior mammograms from 2019. no new abnormality seen in the left breast. Physical examination of the left breast reveals thickening of the areola with areas of scarring. No nipple discharge elicited. Targeted ultrasound of the retroareolar left breast was performed. No suspicious masses or abnormality seen. No intraductal masses or fluid collections. IMPRESSION: 1.  No findings of malignancy in either breast. 2. The mammographic appearance the left breast is overall stable and chronic left nipple discharge is felt to be physiologic versus waxing and waning infection. No nipple discharge or signs of breast infection observed on today's exam. RECOMMENDATION: 1. Recommend further management of chronic left nipple discharge be based on clinical assessment. 2.  Screening mammogram in one year.(Code:SM-B-01Y) I have discussed the findings and recommendations with the patient. If applicable, a reminder letter will be sent to the patient regarding the next appointment. BI-RADS CATEGORY  1: Negative. Electronically Signed   By: Everlean Alstrom M.D.   On: 10/27/2021 12:09    Orson Eva, DO  [facility]  If 7PM-7AM, please contact night-coverage [email or web address] Password [email or web address] 11/12/2021, 6:53 AM   LOS: 1 day

## 2021-11-15 NOTE — Progress Notes (Signed)
PROGRESS NOTE  Courtney Dyer XYB:[id number] DOB: [birth year] DOA: 10/21/2021 PCP: Pcp, No   Brief History:   56 y.o. female with medical history significant for hyperlipidemia, T2DM, gout, GERD , HTN, CKD IV admitted on 10/30/2021 with AKI on CKD stage IV, severe electrolyte derangement, and persistent  severe dehydration partly due to persistent nausea and vomiting with persistent hypotension requiring IV fluids on IV Levophed for pressure support in the setting of sepsis and dehydration.  Urinalysis showed greater than 50 WBC.  Although urine culture and blood cultures were negative, the patient was treated empirically with cefepime and vancomycin.  Vancomycin was subsequently switched over to linezolid due to patient's worsening renal function.  The patient finished 10 days of empiric antibiotic therapy.  She was ultimately weaned off of levophed.  However, the patient's renal function continued to worsen.  Nephrology was consulted.  Hemodialysis was initiated 10/30/2021.  She was also dialyzed on 10/31/2021.  She was subsequently monitored off of HD.  Unfortunately, the patient continued to worsen clinically.  Her renal function continued to worsen again.   Patient was treated with IV fluids and albumin.  Her urine output continued to decline. Nephrology felt that patient was a poor HD candidate.   Palliative medicine was consulted.  Goals of care discussion was held.  The patient continued to be lethargic without much improvement in her mental status.  Her renal function did not show significant improvement.  In fact, her renal function continued to worsen again after 2 sessions of dialysis.  Her oral intake was poor.  After goals of therapy, family did not want to pursue any further RRT.  Ultimately, the patient was transitioned to comfort care measures only.  Social work assisted in transitioning patient to residential hospice.  Because there were no beds available, the patient was  transition to general inpatient hospice admission.  Full comfort measures were continued.   Assessment/Plan: 1)Persistent Hypotension/hemodynamic instability--- in the setting of dehydration and presumed sepsis -Continues to have low BP and has failed to respond to aggressive management.  -Completed tx with antibiotics; has remained afebrile.  -non-verbal, not following commands, not eating and with steadily worsening renal function. -Long goals of care discussion at bedside; family had decided patient to be DNR/DNI with transition to comfort care management.  -Overall patient is not a candidate for long-term hemodialysis (as discussed with Dr. Candiss Norse and [facility] nephrology). -No growth ever appreciated from her blood, urine and GI cultures. -Continue to focus on comfort care. -Hospice evaluated patient and patient was transitioned to GIP with continued full comfort measures.   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   creatinine was around 10, BUN around 160 -Scr trending up 3.8  -Urine output has dropped significantly and fell to improve by receiving diuretics, IV fluids and albumin.   -Discussion with nephrology service  expressing pt not good candidate for hemodialysis or CVVH at this point. -Family in acceptance and looking for full comfort care and symptomatic management only. -Stopped blood work/radiographic studies moving forward.   3) acute on chronic anemia--- no evidence of ongoing bleeding suspect some component of chronic anemia of CKD -Serum iron is 81  iron saturation 71, ferritin 1080  -B12 (829) and folate (15.8) are stable. -Patient has received a total of  2 units of PRBC during this admission. -Last hemoglobin 6.6 on 12/25 blood work; after discussing with family and transitioning to full comfort care only no transfusion will be provided. -ESA treatment at this moment discontinued. -Prior bleeding appreciated. -no further blood drawn planned; continue comfort care.   4)Chronic breast wound--- does not appear infected, Healing left breast periareolar wound--please see ultrasound report from 10/27/2021. -Continue wound care and symptomatic management..   5)Hypokalemia/hyponatremia/hypophosphatemia--electrolyte derangement due to persistent diarrhea and poor oral intake. --No further blood work to follow electrolytes trend anticipated --Patient's care has been transition to full comfort.   6)DM2- A1C 5.2 reflecting excellent diabetic control PTA -Transitioning to full comfort care. -Stopping checking CBGs or providing sliding scale insulin at this point.   7)Social/Ethics--patient's aunt [name] is primary contact and decision maker [phone]) -Goals of care discussion decision has been made for DNR/DNI -Overall prognosis is is poor and hospital death is expected -Palliative care has been consulted and will follow any further recommendations for symptomatic care.. -Further discussion with family members who agree with transition to full comfort care. -Patient is a good candidate for inpatient hospice.   8)Sepsis with Septic Shock--- related to UTI and pneumonia -PCT 0.66 -WBC has trended down appropriately overall but remains elevated -Culture data and antibiotics as above #1 -weaned off Levophed. -Patient has remained afebrile and completed antibiotic therapy. -Currently transitioning to full comfort/symptomatic management only.   9) Acute metabolic encephalopathy: Multifactorial, secondary to sepsis with septic shock and persistent hypotension, dehydration, worsening renal function and electrolyte and metabolic derangement  as mentioned above -Improved with hemodialysis; and subsequently demonstrating further decline. -patient is still no eating/drinking, unable to follow commands and nonverbal. -CT scan demonstrating no acute intracranial normalities; given prolonged episodes of hypotension there is concern for hypoxic injury. -Further discussion with family members has triggered decision for full comfort care with symptomatic management only.   10) Diarrhea-- C. diff and GI pathogen/culture is Negative -Rectal tube placed and IV fluids provided -No further labs anticipated and patient's care has been transitioned to full comfort. -diarrhea continues   11) severe protein calorie malnutrition -In the setting of acute illness and inability to take adequate nutrition/hydration. -Low albumin appreciated in her labs. -Family in agreement not to pursued artificial nutrition and to focus on comfort care.    12-thrombocytopenia: -multifactorial -DVT prophylaxis discontinue at this moment -continue full comfort care and symptomatic management.   Disposition/Need for in-Hospital Stay- patient unable to be discharged at this time due to -Persistent hypotension, with poor to no oral intake.  She is now non-verbal and now with further worsening of renal function and decreased urine output. -After GOC discussions with patient's whole family, they agree to transition to full comfort care. [facility] hospice accepted the patient -due to lack of bed availability and hemodynamic instability, the patient was transitioned to general inpatient hospice admission -full comfort measures were continued -daily communication with family continued throughout her GIP stay with anticipatory guidance.                   Family Communication:   Family at bedside updated 12/29   Consultants:  renal, palliative   Code Status:  FULL COMFORT   DVT Prophylaxis:  FULL COMFORT     Subjective: Patient is resting comfortably  without distress.  No remarkable events overnight.   Objective: Vitals:  2021/11/27 0439  BP: (!) 65/37  Pulse: 100  Resp: 15  Temp: 98 F (36.7 C)  TempSrc: Oral  SpO2: 96%   No intake or output data in the 24 hours ending 2021-11-27 0740 Weight change:  Exam:  General:  Pt is somnolent, no distress HEENT: No icterus,  [place]/AT Cardiovascular: RRR, S1/S2,  Respiratory: bilateral rhonchi Abdomen: Soft/+BS, non distended, Extremities: 1 + LE edema, No lymphangitis,   Data Reviewed: I have personally reviewed following labs and imaging studies Basic Metabolic Panel: Recent Labs  Lab 11/07/21 0503 11/08/21 0527  NA 139 134*  K 2.6* 2.3*  CL 113* 109  CO2 17* 14*  GLUCOSE 198* 165*  BUN 30* 33*  CREATININE 3.52* 3.75*  CALCIUM 8.2* 7.7*  PHOS 2.8 2.9   Liver Function Tests: Recent Labs  Lab 11/07/21 0503 11/08/21 0527  ALBUMIN 1.6* 1.8*   No results for input(s): LIPASE, AMYLASE in the last 168 hours. No results for input(s): AMMONIA in the last 168 hours. Coagulation Profile: Recent Labs  Lab 11/06/21 1110  INR 1.8*   CBC: Recent Labs  Lab 11/06/21 1110 11/08/21 0527  WBC  --  22.2*  HGB  --  6.6*  HCT  --  19.7*  MCV  --  82.4  PLT 107* 78*   Cardiac Enzymes: No results for input(s): CKTOTAL, CKMB, CKMBINDEX, TROPONINI in the last 168 hours. BNP: Invalid input(s): POCBNP CBG: Recent Labs  Lab 11/07/21 1205 11/07/21 1618 11/07/21 2127 11/08/21 0728 11/08/21 1103  GLUCAP 189* 161* 138* 134* 123*   HbA1C: No results for input(s): HGBA1C in the last 72 hours. Urine analysis:    Component Value Date/Time   COLORURINE GREEN (A) 10/29/2021 2005   APPEARANCEUR CLOUDY (A) 10/29/2021 2005   LABSPEC 1.020 10/29/2021 2005   PHURINE 6.0 10/29/2021 2005   GLUCOSEU NEGATIVE 10/29/2021 2005   HGBUR SMALL (A) 10/29/2021 2005   BILIRUBINUR NEGATIVE 10/29/2021 2005   KETONESUR 20 (A) 10/29/2021 2005   PROTEINUR 100 (A) 10/29/2021 2005   NITRITE  POSITIVE (A) 10/29/2021 2005   LEUKOCYTESUR LARGE (A) 10/29/2021 2005   Sepsis Labs: @LABRCNTIP (procalcitonin:4,lacticidven:4) )No results found for this or any previous visit (from the past 240 hour(s)).   Scheduled Meds: Continuous Infusions:  Procedures/Studies: CT ABDOMEN PELVIS WO CONTRAST  Result Date: 10/29/2021 CLINICAL DATA:  Sepsis EXAM: CT ABDOMEN AND PELVIS WITHOUT CONTRAST TECHNIQUE: Multidetector CT imaging of the abdomen and pelvis was performed following the standard protocol without IV contrast. COMPARISON:  08/26/2021 FINDINGS: Lower chest: Elevation of the right hemidiaphragm. Right base atelectasis or infiltrate. Left lung base clear. Hepatobiliary: Small layering gallstones within the gallbladder. Mild distention of the gallbladder. No focal hepatic abnormality. Pancreas: No focal abnormality or ductal dilatation. Spleen: No focal abnormality.  Normal size. Adrenals/Urinary Tract: Stable left adrenal adenoma. Dysplastic right kidney. No stones or hydronephrosis on the left. No renal mass. Foley catheter present in the bladder which is decompressed. Stomach/Bowel: Normal appendix. Sigmoid diverticulosis. No active diverticulitis. Stomach and small bowel decompressed, unremarkable. Vascular/Lymphatic: No evidence of aneurysm or adenopathy. Reproductive: Uterus and adnexa unremarkable.  No mass. Other: No free fluid or free air. Musculoskeletal: No acute bony abnormality. IMPRESSION: Elevation of the right hemidiaphragm with right base atelectasis or infiltrate. Sigmoid diverticulosis.  No active diverticulitis. Cholelithiasis. Gallbladder mildly distended. This could be further evaluated with ultrasound if felt clinically indicated. Electronically Signed   By: Rolm Baptise M.D.   On: 10/29/2021 22:11   CT HEAD WO CONTRAST (5MM)  Result Date: 11/05/2021 CLINICAL DATA:  Mental status changes, combative EXAM: CT HEAD WITHOUT CONTRAST TECHNIQUE: Contiguous axial images were obtained  from the base of the skull through the vertex without intravenous contrast. COMPARISON:  02/13/2021 FINDINGS: Brain: Limited with some motion artifact. No acute intracranial hemorrhage, mass lesion, new infarction, midline shift, herniation, or extra-axial fluid collection. No focal mass effect or edema. Cisterns are patent. No cerebellar abnormality. Vascular: No hyperdense vessel or unexpected calcification. Skull: Similar abnormal thickened appearance of the skull involving the skull base and maxilla with ground-glass density suggesting fibrous dysplasia. Sinuses/Orbits: No acute finding. Other: None. IMPRESSION: No acute intracranial abnormality. 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 Electronically Signed   By: Jerilynn Mages.  Shick M.D.   On: 11/05/2021 13:43   DG CHEST PORT 1 VIEW  Result Date: 11/03/2021 CLINICAL DATA:  Shortness of breath EXAM: PORTABLE CHEST 1 VIEW COMPARISON:  10/30/2021 FINDINGS: Low lung volumes with minimal right base atelectasis. Bilateral internal jugular central line remain in place, unchanged. Heart is normal size. No effusions. No acute bony abnormality. Degenerative changes in the shoulders and thoracic spine. IMPRESSION: Low lung volumes.  Minimal right base atelectasis. Electronically Signed   By: Rolm Baptise M.D.   On: 11/03/2021 10:20   DG Chest Port 1 View  Result Date: 10/30/2021 CLINICAL DATA:  Dialysis catheter placement EXAM: PORTABLE CHEST 1 VIEW COMPARISON:  Yesterday FINDINGS: Left internal jugular line tip at high right atrium. Placement of a right internal jugular dialysis catheter with tip at mid right atrium. Midline trachea. Mild cardiomegaly. No pleural effusion or pneumothorax. Lung volumes are low. Moderate right hemidiaphragm elevation with mild volume loss in the adjacent right lung base. Minimal subsegmental atelectasis at the left lung base. IMPRESSION: No pneumothorax after right internal jugular  line dialysis catheter placement. Cardiomegaly without congestive failure. Electronically Signed   By: Abigail Miyamoto M.D.   On: 10/30/2021 13:12   DG Chest Portable 1 View  Result Date: 10/29/2021 CLINICAL DATA:  IJ line placement EXAM: PORTABLE CHEST 1 VIEW COMPARISON:  10/29/2021 FINDINGS: Left internal jugular central line is in place with the tip at the cavoatrial junction. Elevation of the right hemidiaphragm with right base atelectasis/infiltrate. Left lung clear. No effusions or pneumothorax. No acute bony abnormality. IMPRESSION: Left central line tip at the cavoatrial junction.  No pneumothorax. Stable elevation of the right hemidiaphragm with right base atelectasis or infiltrate. Electronically Signed   By: Rolm Baptise M.D.   On: 10/29/2021 22:06   DG Chest Portable 1 View  Result Date: 10/29/2021 CLINICAL DATA:  Hypotension.  Abnormal labs. EXAM: PORTABLE CHEST 1 VIEW COMPARISON:  Chest radiograph dated August 26, 2021 FINDINGS: The heart size and mediastinal contours are within normal limits. Low lung volumes. Right basilar atelectasis/infiltrate. Advanced bilateral glenohumeral osteoarthritis. IMPRESSION: 1.  Low lung volumes with right basilar atelectasis/infiltrate. 2.  Advanced chronic bilateral glenohumeral osteoarthritis. Electronically Signed   By: Keane Police D.O.   On: 10/29/2021 18:18   US BREAST LTD UNI LEFT INC AXILLA  Result Date: 10/27/2021 CLINICAL DATA:  56 year old female with nipple discharge. Per the patient this discharge occurs on the left, has occurred for many years and is usually white in color. 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 EXAM: DIGITAL DIAGNOSTIC BILATERAL MAMMOGRAM WITH TOMOSYNTHESIS AND CAD; ULTRASOUND LEFT BREAST LIMITED TECHNIQUE: Bilateral digital diagnostic mammography and breast tomosynthesis was performed. The images were evaluated with  computer-aided detection.; Targeted ultrasound  examination of the left breast was performed. COMPARISON:  Previous exams. ACR Breast Density Category a: The breast tissue is almost entirely fatty. FINDINGS: No suspicious masses or calcifications seen in either breast. There is left retroareolar deformity with associated density and distortion, slightly decrease in appearance when compared to prior mammograms from 2019. no new abnormality seen in the left breast. Physical examination of the left breast reveals thickening of the areola with areas of scarring. No nipple discharge elicited. Targeted ultrasound of the retroareolar left breast was performed. No suspicious masses or abnormality seen. No intraductal masses or fluid collections. IMPRESSION: 1.  No findings of malignancy in either breast. 2. The mammographic appearance the left breast is overall stable and chronic left nipple discharge is felt to be physiologic versus waxing and waning infection. No nipple discharge or signs of breast infection observed on today's exam. RECOMMENDATION: 1. Recommend further management of chronic left nipple discharge be based on clinical assessment. 2.  Screening mammogram in one year.(Code:SM-B-01Y) I have discussed the findings and recommendations with the patient. If applicable, a reminder letter will be sent to the patient regarding the next appointment. BI-RADS CATEGORY  1: Negative. Electronically Signed   By: Everlean Alstrom M.D.   On: 10/27/2021 12:09  MM DIAG BREAST TOMO BILATERAL  Result Date: 10/27/2021 CLINICAL DATA:  56 year old female with nipple discharge. Per the patient this discharge occurs on the left, has occurred for many years and is usually white in color. 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 EXAM: DIGITAL DIAGNOSTIC BILATERAL MAMMOGRAM WITH TOMOSYNTHESIS AND CAD; ULTRASOUND LEFT BREAST LIMITED TECHNIQUE: Bilateral digital diagnostic mammography and breast tomosynthesis was performed.  The images were evaluated with computer-aided detection.; Targeted ultrasound examination of the left breast was performed. COMPARISON:  Previous exams. ACR Breast Density Category a: The breast tissue is almost entirely fatty. FINDINGS: No suspicious masses or calcifications seen in either breast. There is left retroareolar deformity with associated density and distortion, slightly decrease in appearance when compared to prior mammograms from 2019. no new abnormality seen in the left breast. Physical examination of the left breast reveals thickening of the areola with areas of scarring. No nipple discharge elicited. Targeted ultrasound of the retroareolar left breast was performed. No suspicious masses or abnormality seen. No intraductal masses or fluid collections. IMPRESSION: 1.  No findings of malignancy in either breast. 2. The mammographic appearance the left breast is overall stable and chronic left nipple discharge is felt to be physiologic versus waxing and waning infection. No nipple discharge or signs of breast infection observed on today's exam. RECOMMENDATION: 1. Recommend further management of chronic left nipple discharge be based on clinical assessment. 2.  Screening mammogram in one year.(Code:SM-B-01Y) I have discussed the findings and recommendations with the patient. If applicable, a reminder letter will be sent to the patient regarding the next appointment. BI-RADS CATEGORY  1: Negative. Electronically Signed   By: Everlean Alstrom M.D.   On: 10/27/2021 12:09    Orson Eva, DO  [facility]  If 7PM-7AM, please contact night-coverage [email or web address] Password [email or web address] Dec 09, 2021, 7:40 AM   LOS: 2 days

## 2021-11-15 NOTE — Progress Notes (Signed)
Family would like for pt to go to [employer] [facility].  [phone]

## 2021-11-15 NOTE — Progress Notes (Signed)
Called to pt's room by family, family states, "She ain't breathing". Pt noted to have absence of respirations, apical heart tones absent to ausculation. Absence of both verified by second nurse Nigel Sloop, LPN. Time of death 88. Verified death to family. Comfort offered, advised to call staff for any need as they take time to mourn. Stated understanding.

## 2021-11-15 NOTE — Progress Notes (Signed)
Notified family of plan to complete dressing change to bilateral posterior thighs and buttocks. Family stated that patient has been getting pain medication prior to wound care due to patient being in pain while changing the dressings. Patient medicated with Dilaudid prior to wound care (see MAR). Wound care completed to posterior thighs and buttocks cleaned with NS redressed with Vaseline guaze covered with ABD pads and secured with mepilex dressing to protect skin from further damage. Patient tolerated well. Family at bedside during dressing change per there request.

## 2021-11-15 NOTE — Progress Notes (Signed)
Patient said they are not going to donate anything.

## 2021-11-15 DEATH — deceased

## 2021-11-25 ENCOUNTER — Ambulatory Visit: Payer: Medicare Other | Admitting: Internal Medicine

## 2021-12-16 NOTE — Death Summary Note (Signed)
DEATH SUMMARY   Patient Details  Name: Courtney Dyer MRN: [id number] DOB: 22-Mar-1969  Admission/Discharge Information   Admit Date:  2021/12/08  Date of Death: Date of Death: December 10, 2021  Time of Death: Time of Death: 02-25-01  Length of Stay: 2  Referring Physician: Pcp, No   Reason(s) for Hospitalization  hypotension  Diagnoses  Preliminary cause of death: acute on chronic renal failure 1)Persistent Hypotension/hemodynamic instability--- in the setting of dehydration and presumed sepsis -Continues to have low BP and has failed to respond to aggressive management.  -Completed tx with antibiotics; has remained afebrile.  -non-verbal, not following commands, not eating and with steadily worsening renal function. -Long goals of care discussion at bedside; family had decided patient to be DNR/DNI with transition to comfort care management.  -Overall patient is not a candidate for long-term hemodialysis (as discussed with Dr. Candiss Norse and [facility] nephrology). -No growth ever appreciated from her blood, urine and GI cultures. -Continue to focus on comfort care. -Hospice evaluated patient and patient was transitioned to GIP with continued full comfort measures.   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   creatinine was around 10, BUN around 160 -Scr trending up 3.8  -Urine output has dropped significantly and fell to improve by receiving diuretics, IV fluids and albumin.   -Discussion with nephrology service  expressing pt not good candidate for hemodialysis or CVVH at this point. -Family in acceptance and looking for full comfort care and symptomatic management  only. -Stopped blood work/radiographic studies moving forward.   3) acute on chronic anemia--- no evidence of ongoing bleeding suspect some component of chronic anemia of CKD -Serum iron is 81 iron saturation 71, ferritin 1080  -B12 (829) and folate (15.8) are stable. -Patient has received a total of 2 units of PRBC during this admission. -Last hemoglobin 6.6 on 12/25 blood work; after discussing with family and transitioning to full comfort care only no transfusion will be provided. -ESA treatment at this moment discontinued. -Prior bleeding appreciated. -no further blood drawn planned; continue comfort care.   4)Chronic breast wound--- does not appear infected, Healing left breast periareolar wound--please see ultrasound report from 10/27/2021. -Continue wound care and symptomatic management..   5)Hypokalemia/hyponatremia/hypophosphatemia--electrolyte derangement due to persistent diarrhea and poor oral intake. --No further blood work to follow electrolytes trend anticipated --Patient's care has been transition to full comfort.   6)DM2- A1C 5.2 reflecting excellent diabetic control PTA -Transitioning to full comfort care. -Stopping checking CBGs or providing sliding scale insulin at this point.   7)Social/Ethics--patient's aunt [name] is primary contact and decision maker [phone]) -Goals of care discussion decision has been made for DNR/DNI -Overall prognosis is is poor and hospital death is expected -Palliative care has been consulted and will follow any further recommendations for symptomatic care.. -Further discussion with family members who agree with transition to full comfort care. -Patient is a good candidate for inpatient hospice.   8)Sepsis with Septic Shock--- related to UTI and pneumonia -PCT 0.66 -WBC has trended down appropriately overall but remains elevated -Culture data and antibiotics as above #1 -weaned off Levophed. -Patient has remained  afebrile and completed antibiotic therapy. -Currently transitioning to full comfort/symptomatic management only.   9) Acute metabolic  encephalopathy: Multifactorial, secondary to sepsis with septic shock and persistent hypotension, dehydration, worsening renal function and electrolyte and metabolic derangement as mentioned above -Improved with hemodialysis; and subsequently demonstrating further decline. -patient is still no eating/drinking, unable to follow commands and nonverbal. -CT scan demonstrating no acute intracranial normalities; given prolonged episodes of hypotension there is concern for hypoxic injury. -Further discussion with family members has triggered decision for full comfort care with symptomatic management only.   10) Diarrhea-- C. diff and GI pathogen/culture is Negative -Rectal tube placed and IV fluids provided -No further labs anticipated and patient's care has been transitioned to full comfort. -diarrhea continues   11) severe protein calorie malnutrition -In the setting of acute illness and inability to take adequate nutrition/hydration. -Low albumin appreciated in her labs. -Family in agreement not to pursued artificial nutrition and to focus on comfort care.    12-thrombocytopenia: -multifactorial -DVT prophylaxis discontinue at this moment -continue full comfort care and symptomatic management.   Disposition/Need for in-Hospital Stay- patient unable to be discharged at this time due to -Persistent hypotension, with poor to no oral intake.  She is now non-verbal and now with further worsening of renal function and decreased urine output. -After GOC discussions with patient's whole family, they agree to transition to full comfort care. [facility] hospice accepted the patient -due to lack of bed availability and hemodynamic instability, the patient was transitioned to general inpatient hospice admission -full comfort measures were continued -daily communication  with family continued throughout her GIP stay with anticipatory guidance.      Brief Hospital Course (including significant findings, care, treatment, and services provided and events leading to death)  FORESTINE MACHO is a  56 y.o. female with medical history significant for hyperlipidemia, T2DM, gout, GERD , HTN, CKD IV admitted on 10/30/2021 with AKI on CKD stage IV, severe electrolyte derangement, and persistent  severe dehydration partly due to persistent nausea and vomiting with persistent hypotension requiring IV fluids on IV Levophed for pressure support in the setting of sepsis and dehydration.  Urinalysis showed greater than 50 WBC.  Although urine culture and blood cultures were negative, the patient was treated empirically with cefepime and vancomycin.  Vancomycin was subsequently switched over to linezolid due to patient's worsening renal function.  The patient finished 10 days of empiric antibiotic therapy.  She was ultimately weaned off of levophed.  However, the patient's renal function continued to worsen.  Nephrology was consulted.  Hemodialysis was initiated 10/30/2021.  She was also dialyzed on 10/31/2021.  She was subsequently monitored off of HD.  Unfortunately, the patient continued to worsen clinically.  Her renal function continued to worsen again.   Patient was treated with IV fluids and albumin.  Her urine output continued to decline. Nephrology felt that patient was a poor HD candidate.   Palliative medicine was consulted.  Goals of care discussion was held.  The patient continued to be lethargic without much improvement in her mental status.  Her renal function did not show significant improvement.  In fact, her renal function continued to worsen again after 2 sessions of dialysis.  Her oral intake was poor.  After goals of therapy, family did not want to pursue any further RRT.  Ultimately, the patient was transitioned to comfort care measures only.  Social work assisted in  transitioning patient to residential hospice.  Because there were no beds available, the patient was transition to general inpatient hospice admission.  Full comfort measures were continued.  Pertinent Labs and Studies  Significant Diagnostic Studies CT ABDOMEN PELVIS WO CONTRAST  Result Date: 10/29/2021 CLINICAL DATA:  Sepsis EXAM: CT ABDOMEN AND PELVIS WITHOUT CONTRAST TECHNIQUE: Multidetector CT imaging of the abdomen and pelvis was performed following the standard protocol without IV contrast. COMPARISON:  08/26/2021 FINDINGS: Lower chest: Elevation of the right hemidiaphragm. Right base atelectasis or infiltrate. Left lung base clear. Hepatobiliary: Small layering gallstones within the gallbladder. Mild distention of the gallbladder. No focal hepatic abnormality. Pancreas: No focal abnormality or ductal dilatation. Spleen: No focal abnormality.  Normal size. Adrenals/Urinary Tract: Stable left adrenal adenoma. Dysplastic right kidney. No stones or hydronephrosis on the left. No renal mass. Foley catheter present in the bladder which is decompressed. Stomach/Bowel: Normal appendix. Sigmoid diverticulosis. No active diverticulitis. Stomach and small bowel decompressed, unremarkable. Vascular/Lymphatic: No evidence of aneurysm or adenopathy. Reproductive: Uterus and adnexa unremarkable.  No mass. Other: No free fluid or free air. Musculoskeletal: No acute bony abnormality. IMPRESSION: Elevation of the right hemidiaphragm with right base atelectasis or infiltrate. Sigmoid diverticulosis.  No active diverticulitis. Cholelithiasis. Gallbladder mildly distended. This could be further evaluated with ultrasound if felt clinically indicated. Electronically Signed   By: Rolm Baptise M.D.   On: 10/29/2021 22:11   CT HEAD WO CONTRAST (5MM)  Result Date: 11/05/2021 CLINICAL DATA:  Mental status changes, combative EXAM: CT HEAD WITHOUT CONTRAST TECHNIQUE: Contiguous axial images were obtained from the base of  the skull through the vertex without intravenous contrast. COMPARISON:  02/13/2021 FINDINGS: Brain: Limited with some motion artifact. No acute intracranial hemorrhage, mass lesion, new infarction, midline shift, herniation, or extra-axial fluid collection. No focal mass effect or edema. Cisterns are patent. No cerebellar abnormality. Vascular: No hyperdense vessel or unexpected calcification. Skull: Similar abnormal thickened appearance of the skull involving the skull base and maxilla with ground-glass density suggesting fibrous dysplasia. Sinuses/Orbits: No acute finding. Other: None. IMPRESSION: No acute intracranial abnormality. 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 Electronically Signed   By: Jerilynn Mages.  Shick M.D.   On: 11/05/2021 13:43   DG CHEST PORT 1 VIEW  Result Date: 11/03/2021 CLINICAL DATA:  Shortness of breath EXAM: PORTABLE CHEST 1 VIEW COMPARISON:  10/30/2021 FINDINGS: Low lung volumes with minimal right base atelectasis. Bilateral internal jugular central line remain in place, unchanged. Heart is normal size. No effusions. No acute bony abnormality. Degenerative changes in the shoulders and thoracic spine. IMPRESSION: Low lung volumes.  Minimal right base atelectasis. Electronically Signed   By: Rolm Baptise M.D.   On: 11/03/2021 10:20   DG Chest Port 1 View  Result Date: 10/30/2021 CLINICAL DATA:  Dialysis catheter placement EXAM: PORTABLE CHEST 1 VIEW COMPARISON:  Yesterday FINDINGS: Left internal jugular line tip at high right atrium. Placement of a right internal jugular dialysis catheter with tip at mid right atrium. Midline trachea. Mild cardiomegaly. No pleural effusion or pneumothorax. Lung volumes are low. Moderate right hemidiaphragm elevation with mild volume loss in the adjacent right lung base. Minimal subsegmental atelectasis at the left lung base. IMPRESSION: No pneumothorax after right internal jugular line dialysis  catheter placement. Cardiomegaly without congestive failure. Electronically Signed   By: Abigail Miyamoto M.D.   On: 10/30/2021 13:12   DG Chest Portable 1 View  Result Date: 10/29/2021 CLINICAL DATA:  IJ line placement EXAM: PORTABLE CHEST 1 VIEW COMPARISON:  10/29/2021 FINDINGS: Left internal jugular central line is in place with the tip at the cavoatrial junction. Elevation of the right hemidiaphragm  with right base atelectasis/infiltrate. Left lung clear. No effusions or pneumothorax. No acute bony abnormality. IMPRESSION: Left central line tip at the cavoatrial junction.  No pneumothorax. Stable elevation of the right hemidiaphragm with right base atelectasis or infiltrate. Electronically Signed   By: Rolm Baptise M.D.   On: 10/29/2021 22:06   DG Chest Portable 1 View  Result Date: 10/29/2021 CLINICAL DATA:  Hypotension.  Abnormal labs. EXAM: PORTABLE CHEST 1 VIEW COMPARISON:  Chest radiograph dated August 26, 2021 FINDINGS: The heart size and mediastinal contours are within normal limits. Low lung volumes. Right basilar atelectasis/infiltrate. Advanced bilateral glenohumeral osteoarthritis. IMPRESSION: 1.  Low lung volumes with right basilar atelectasis/infiltrate. 2.  Advanced chronic bilateral glenohumeral osteoarthritis. Electronically Signed   By: Keane Police D.O.   On: 10/29/2021 18:18   US BREAST LTD UNI LEFT INC AXILLA  Result Date: 10/27/2021 CLINICAL DATA:  56 year old female with nipple discharge. Per the patient this discharge occurs on the left, has occurred for many years and is usually white in color. 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 EXAM: DIGITAL DIAGNOSTIC BILATERAL MAMMOGRAM WITH TOMOSYNTHESIS AND CAD; ULTRASOUND LEFT BREAST LIMITED TECHNIQUE: Bilateral digital diagnostic mammography and breast tomosynthesis was performed. The images were evaluated with computer-aided detection.; Targeted ultrasound examination of the  left breast was performed. COMPARISON:  Previous exams. ACR Breast Density Category a: The breast tissue is almost entirely fatty. FINDINGS: No suspicious masses or calcifications seen in either breast. There is left retroareolar deformity with associated density and distortion, slightly decrease in appearance when compared to prior mammograms from 2019. no new abnormality seen in the left breast. Physical examination of the left breast reveals thickening of the areola with areas of scarring. No nipple discharge elicited. Targeted ultrasound of the retroareolar left breast was performed. No suspicious masses or abnormality seen. No intraductal masses or fluid collections. IMPRESSION: 1.  No findings of malignancy in either breast. 2. The mammographic appearance the left breast is overall stable and chronic left nipple discharge is felt to be physiologic versus waxing and waning infection. No nipple discharge or signs of breast infection observed on today's exam. RECOMMENDATION: 1. Recommend further management of chronic left nipple discharge be based on clinical assessment. 2.  Screening mammogram in one year.(Code:SM-B-01Y) I have discussed the findings and recommendations with the patient. If applicable, a reminder letter will be sent to the patient regarding the next appointment. BI-RADS CATEGORY  1: Negative. Electronically Signed   By: Everlean Alstrom M.D.   On: 10/27/2021 12:09  MM DIAG BREAST TOMO BILATERAL  Result Date: 10/27/2021 CLINICAL DATA:  56 year old female with nipple discharge. Per the patient this discharge occurs on the left, has occurred for many years and is usually white in color. 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 EXAM: DIGITAL DIAGNOSTIC BILATERAL MAMMOGRAM WITH TOMOSYNTHESIS AND CAD; ULTRASOUND LEFT BREAST LIMITED TECHNIQUE: Bilateral digital diagnostic mammography and breast tomosynthesis was performed. The images were  evaluated with computer-aided detection.; Targeted ultrasound examination of the left breast was performed. COMPARISON:  Previous exams. ACR Breast Density Category a: The breast tissue is almost entirely fatty. FINDINGS: No suspicious masses or calcifications seen in either breast. There is left retroareolar deformity with associated density and distortion, slightly decrease in appearance when compared to prior mammograms from 2019. no new abnormality seen in the left breast. Physical examination of the left breast reveals thickening of the areola with  areas of scarring. No nipple discharge elicited. Targeted ultrasound of the retroareolar left breast was performed. No suspicious masses or abnormality seen. No intraductal masses or fluid collections. IMPRESSION: 1.  No findings of malignancy in either breast. 2. The mammographic appearance the left breast is overall stable and chronic left nipple discharge is felt to be physiologic versus waxing and waning infection. No nipple discharge or signs of breast infection observed on today's exam. RECOMMENDATION: 1. Recommend further management of chronic left nipple discharge be based on clinical assessment. 2.  Screening mammogram in one year.(Code:SM-B-01Y) I have discussed the findings and recommendations with the patient. If applicable, a reminder letter will be sent to the patient regarding the next appointment. BI-RADS CATEGORY  1: Negative. Electronically Signed   By: Everlean Alstrom M.D.   On: 10/27/2021 12:09    Microbiology No results found for this or any previous visit (from the past 240 hour(s)).  Lab Basic Metabolic Panel: No results for input(s): NA, K, CL, CO2, GLUCOSE, BUN, CREATININE, CALCIUM, MG, PHOS in the last 168 hours. Liver Function Tests: No results for input(s): AST, ALT, ALKPHOS, BILITOT, PROT, ALBUMIN in the last 168 hours. No results for input(s): LIPASE, AMYLASE in the last 168 hours. No results for input(s): AMMONIA in the last  168 hours. CBC: No results for input(s): WBC, NEUTROABS, HGB, HCT, MCV, PLT in the last 168 hours. Cardiac Enzymes: No results for input(s): CKTOTAL, CKMB, CKMBINDEX, TROPONINI in the last 168 hours. Sepsis Labs: No results for input(s): PROCALCITON, WBC, LATICACIDVEN in the last 168 hours.  Procedures/Operations    Shanon Brow Gared Gillie 11/15/2021, 5:52 PM
